# Patient Record
Sex: Male | Born: 1960 | Race: Black or African American | Hispanic: No | Marital: Married | Smoking: Never smoker
Health system: Southern US, Community
[De-identification: ages and names within clinical notes are randomized; demographics above are authoritative.]

## PROBLEM LIST (undated history)

## (undated) DIAGNOSIS — E119 Type 2 diabetes mellitus without complications: Secondary | ICD-10-CM

## (undated) DIAGNOSIS — M549 Dorsalgia, unspecified: Secondary | ICD-10-CM

## (undated) DIAGNOSIS — E66811 Obesity, class 1: Secondary | ICD-10-CM

## (undated) DIAGNOSIS — E669 Obesity, unspecified: Secondary | ICD-10-CM

## (undated) DIAGNOSIS — G8929 Other chronic pain: Secondary | ICD-10-CM

## (undated) HISTORY — PX: COLONOSCOPY: SHX174

## (undated) HISTORY — PX: BACK SURGERY: SHX140

---

## 1999-09-01 ENCOUNTER — Emergency Department (HOSPITAL_COMMUNITY): Admission: EM | Admit: 1999-09-01 | Discharge: 1999-09-01 | Payer: Self-pay | Admitting: Emergency Medicine

## 2000-04-23 ENCOUNTER — Ambulatory Visit (HOSPITAL_COMMUNITY): Admission: RE | Admit: 2000-04-23 | Discharge: 2000-04-23 | Payer: Self-pay | Admitting: Family Medicine

## 2000-04-23 ENCOUNTER — Encounter: Payer: Self-pay | Admitting: Family Medicine

## 2000-09-28 ENCOUNTER — Emergency Department (HOSPITAL_COMMUNITY): Admission: EM | Admit: 2000-09-28 | Discharge: 2000-09-28 | Payer: Self-pay | Admitting: Emergency Medicine

## 2002-04-06 ENCOUNTER — Emergency Department (HOSPITAL_COMMUNITY): Admission: EM | Admit: 2002-04-06 | Discharge: 2002-04-07 | Payer: Self-pay | Admitting: Emergency Medicine

## 2002-10-27 ENCOUNTER — Emergency Department (HOSPITAL_COMMUNITY): Admission: EM | Admit: 2002-10-27 | Discharge: 2002-10-28 | Payer: Self-pay | Admitting: Emergency Medicine

## 2006-03-25 ENCOUNTER — Emergency Department (HOSPITAL_COMMUNITY): Admission: EM | Admit: 2006-03-25 | Discharge: 2006-03-25 | Payer: Self-pay | Admitting: Emergency Medicine

## 2007-04-30 ENCOUNTER — Emergency Department (HOSPITAL_COMMUNITY): Admission: EM | Admit: 2007-04-30 | Discharge: 2007-04-30 | Payer: Self-pay | Admitting: Emergency Medicine

## 2008-02-16 ENCOUNTER — Ambulatory Visit: Payer: Self-pay | Admitting: Pulmonary Disease

## 2008-02-16 ENCOUNTER — Inpatient Hospital Stay (HOSPITAL_COMMUNITY): Admission: EM | Admit: 2008-02-16 | Discharge: 2008-02-19 | Payer: Self-pay | Admitting: Emergency Medicine

## 2009-06-18 ENCOUNTER — Emergency Department (HOSPITAL_COMMUNITY): Admission: EM | Admit: 2009-06-18 | Discharge: 2009-06-18 | Payer: Self-pay | Admitting: Emergency Medicine

## 2010-04-19 LAB — BASIC METABOLIC PANEL
BUN: 10 mg/dL (ref 6–23)
BUN: 15 mg/dL (ref 6–23)
BUN: 7 mg/dL (ref 6–23)
BUN: 8 mg/dL (ref 6–23)
CO2: 24 mEq/L (ref 19–32)
CO2: 28 mEq/L (ref 19–32)
Calcium: 9.3 mg/dL (ref 8.4–10.5)
Calcium: 8.9 mg/dL (ref 8.4–10.5)
Chloride: 106 mEq/L (ref 96–112)
Chloride: 106 mEq/L (ref 96–112)
Creatinine, Ser: 1.54 mg/dL — ABNORMAL HIGH (ref 0.4–1.5)
Creatinine, Ser: 1.73 mg/dL — ABNORMAL HIGH (ref 0.4–1.5)
Creatinine, Ser: 1.3 mg/dL (ref 0.4–1.5)
GFR calc Af Amer: 59 mL/min — ABNORMAL LOW (ref >60)
GFR calc non Af Amer: 43 mL/min — ABNORMAL LOW (ref >60)
GFR calc non Af Amer: 55 mL/min — ABNORMAL LOW (ref >60)
Glucose, Bld: 175 mg/dL — ABNORMAL HIGH (ref 70–99)
Glucose, Bld: 176 mg/dL — ABNORMAL HIGH (ref 70–99)
Potassium: 4 mEq/L (ref 3.5–5.1)
Potassium: 4.1 mEq/L (ref 3.5–5.1)
Sodium: 139 mEq/L (ref 135–145)

## 2010-04-19 LAB — BLOOD GAS, ARTERIAL
Acid-Base Excess: 0.6 mmol/L (ref 0.0–2.0)
Acid-base deficit: 1.7 mmol/L (ref 0.0–2.0)
Bicarbonate: 23.5 mEq/L (ref 20.0–24.0)
Bicarbonate: 24.6 mEq/L — ABNORMAL HIGH (ref 20.0–24.0)
FIO2: 1 %
FIO2: 40 %
O2 Saturation: 98.9 %
O2 Saturation: 98.9 %
PEEP: 5 cmH2O
Patient temperature: 101
Patient temperature: 98.6
TCO2: 24.9 mmol/L (ref 0–100)
TCO2: 25.8 mmol/L (ref 0–100)
pH, Arterial: 7.325 — ABNORMAL LOW (ref 7.350–7.450)

## 2010-04-19 LAB — POCT CARDIAC MARKERS
CKMB, poc: 3 ng/mL (ref 1.0–8.0)
Myoglobin, poc: >500 ng/mL (ref 12–200)

## 2010-04-19 LAB — CK TOTAL AND CKMB (NOT AT ARMC)
Total CK: 954 U/L — ABNORMAL HIGH (ref 7–232)
Total CK: 1426 U/L — ABNORMAL HIGH (ref 7–232)

## 2010-04-19 LAB — GLUCOSE, CAPILLARY
Glucose-Capillary: 133 mg/dL — ABNORMAL HIGH (ref 70–99)
Glucose-Capillary: 205 mg/dL — ABNORMAL HIGH (ref 70–99)
Glucose-Capillary: 206 mg/dL — ABNORMAL HIGH (ref 70–99)
Glucose-Capillary: 210 mg/dL — ABNORMAL HIGH (ref 70–99)
Glucose-Capillary: 257 mg/dL — ABNORMAL HIGH (ref 70–99)
Glucose-Capillary: 257 mg/dL — ABNORMAL HIGH (ref 70–99)

## 2010-04-19 LAB — HEPATIC FUNCTION PANEL
Alkaline Phosphatase: 102 U/L (ref 39–117)
Indirect Bilirubin: 0.4 mg/dL (ref 0.3–0.9)
Total Bilirubin: 0.6 mg/dL (ref 0.3–1.2)

## 2010-04-19 LAB — URINE DRUGS OF ABUSE SCREEN W ALC, ROUTINE (REF LAB)
Barbiturate Quant, Ur: NEGATIVE
Marijuana Metabolite: NEGATIVE
Phencyclidine (PCP): NEGATIVE
Propoxyphene: NEGATIVE

## 2010-04-19 LAB — POCT I-STAT, CHEM 8
Chloride: 107 mEq/L (ref 96–112)
Creatinine, Ser: 1.8 mg/dL — ABNORMAL HIGH (ref 0.4–1.5)
Glucose, Bld: 117 mg/dL — ABNORMAL HIGH (ref 70–99)
Glucose, Bld: 94 mg/dL (ref 70–99)
HCT: 41 % (ref 39.0–52.0)
HCT: 45 % (ref 39.0–52.0)
Hemoglobin: 13.9 g/dL (ref 13.0–17.0)
Potassium: 2.5 mEq/L — CL (ref 3.5–5.1)
Potassium: 3.3 mEq/L — ABNORMAL LOW (ref 3.5–5.1)
Sodium: 143 mEq/L (ref 135–145)
Sodium: 144 mEq/L (ref 135–145)
TCO2: 21 mmol/L (ref 0–100)

## 2010-04-19 LAB — BENZODIAZEPINE, QUANTITATIVE, URINE
Flurazepam GC/MS Conf: NEGATIVE
Nordiazepam GC/MS Conf: NEGATIVE

## 2010-04-19 LAB — ABO/RH: ABO/RH(D): O POS

## 2010-04-19 LAB — COMPREHENSIVE METABOLIC PANEL
ALT: 31 U/L (ref 0–53)
AST: 72 U/L — ABNORMAL HIGH (ref 0–37)
Alkaline Phosphatase: 92 U/L (ref 39–117)
CO2: 28 mEq/L (ref 19–32)
Calcium: 8.4 mg/dL (ref 8.4–10.5)
Chloride: 109 mEq/L (ref 96–112)
GFR calc Af Amer: 56 mL/min — ABNORMAL LOW (ref >60)
GFR calc non Af Amer: 46 mL/min — ABNORMAL LOW (ref >60)
Glucose, Bld: 74 mg/dL (ref 70–99)
Potassium: 4.1 mEq/L (ref 3.5–5.1)
Sodium: 142 mEq/L (ref 135–145)

## 2010-04-19 LAB — CARDIAC PANEL(CRET KIN+CKTOT+MB+TROPI)
CK, MB: 61.1 ng/mL — ABNORMAL HIGH (ref 0.3–4.0)
CK, MB: 11.8 ng/mL — ABNORMAL HIGH (ref 0.3–4.0)
Relative Index: 1.5 (ref 0.0–2.5)
Relative Index: 2.7 — ABNORMAL HIGH (ref 0.0–2.5)
Relative Index: 0.4 (ref 0.0–2.5)
Total CK: 3528 U/L — ABNORMAL HIGH (ref 7–232)
Total CK: 4139 U/L — ABNORMAL HIGH (ref 7–232)

## 2010-04-19 LAB — LIPID PANEL
Cholesterol: 176 mg/dL (ref 0–200)
LDL Cholesterol: 104 mg/dL — ABNORMAL HIGH (ref 0–99)
Triglycerides: 204 mg/dL — ABNORMAL HIGH (ref <150)

## 2010-04-19 LAB — CBC
HCT: 33.2 % — ABNORMAL LOW (ref 39.0–52.0)
HCT: 34.6 % — ABNORMAL LOW (ref 39.0–52.0)
Hemoglobin: 11.7 g/dL — ABNORMAL LOW (ref 13.0–17.0)
MCV: 87.9 fL (ref 78.0–100.0)
MCV: 88.3 fL (ref 78.0–100.0)
Platelets: 332 10*3/uL (ref 150–400)
RBC: 3.92 MIL/uL — ABNORMAL LOW (ref 4.22–5.81)
RBC: 3.79 MIL/uL — ABNORMAL LOW (ref 4.22–5.81)
RDW: 12.5 % (ref 11.5–15.5)
RDW: 13.1 % (ref 11.5–15.5)
WBC: 18.6 10*3/uL — ABNORMAL HIGH (ref 4.0–10.5)

## 2010-04-19 LAB — MAGNESIUM
Magnesium: 1.9 mg/dL (ref 1.5–2.5)
Magnesium: 2.1 mg/dL (ref 1.5–2.5)
Magnesium: 2.2 mg/dL (ref 1.5–2.5)

## 2010-04-19 LAB — POCT I-STAT 3, ART BLOOD GAS (G3+)
Acid-base deficit: 5 mmol/L — ABNORMAL HIGH (ref 0.0–2.0)
O2 Saturation: 100 %
pO2, Arterial: 192 mmHg — ABNORMAL HIGH (ref 80.0–100.0)

## 2010-04-19 LAB — CROSSMATCH: Antibody Screen: NEGATIVE

## 2010-04-19 LAB — CULTURE, BLOOD (ROUTINE X 2)
Culture: NO GROWTH
Culture: NO GROWTH

## 2010-04-19 LAB — PHOSPHORUS: Phosphorus: 1.1 mg/dL — ABNORMAL LOW (ref 2.3–4.6)
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
## 2010-05-17 NOTE — Cardiovascular Report (Signed)
NAME:  Lucas White, Lucas White                ACCOUNT NO.:  [id number]   MEDICAL RECORD NO.:  [id number]          PATIENT TYPE:  INP   LOCATION:  1829                         FACILITY:  [facility]   PHYSICIAN:  Mohan N. Sharyn Lull, M.D. DATE OF BIRTH:  25-Jun-1960   DATE OF PROCEDURE:  02/16/2008  DATE OF DISCHARGE:                            CARDIAC CATHETERIZATION   PROCEDURE:  Left cardiac catheterization with selective left and right  coronary angiography, left ventriculography, via right groin using  Judkins technique.   INDICATIONS FOR THE PROCEDURE:  Mr. Callaway is a 50 year old black male  with past medical history significant for insulin-requiring diabetes  mellitus, erectile dysfunction, history of cocaine abuse in the past.  He was brought to the ED by EMS as code STEMI as the patient was found  unresponsive after eating shrimp per wife.  Wife did CPR on the field  for few minutes and the patient had EKG done on field, which showed  sinus tachycardia with heart rate of 140 with ST elevation in  anterolateral leads and ST depression in inferior leads.  Repeat EKG  done in the ED showed sinus tach with normalization of ST elevation in  the anterolateral leads.  The patient complained of severe back pain in  the ED and became hypotensive and was intubated in the ED.  The patient  received fluid bolus and was started on dopamine, had emergency CT of  the chest, abdomen, and pelvis, which showed no evidence of dissection  due to ST elevation and cardiac arrest on the field.  I discussed with  the patient's wife and family regarding emergency left cath, possible  PTCA stenting, its risks and benefits i.e. death, MI, stroke, need for  emergency CABG, risk of restenosis, etc., and consented for the  procedure.  Off note, wife states he has lately been taking Cialis and  extends over the counter medication.  Wife denied he has been doing any  cocaine recently.  The patient presently is intubated,  unresponsive, no  further history could be obtained from the patient.   DESCRIPTION OF PROCEDURE:  After obtaining the informed consent, the  patient was brought to the cath lab and was placed on fluoroscopy table.  Right groin was prepped and draped in usual fashion.  Xylocaine 2% was  used for local anesthesia in the right groin.  With the help of thin-  wall needle, 6-French arterial sheath was placed.  The sheath was  aspirated and flushed.  Next, 6-French left Judkins catheter was  advanced over the wire under fluoroscopic guidance up to the ascending  aorta.  Wire was pulled out.  The catheter was aspirated and connected  to the manifold.  Catheter was further advanced and engaged into left  coronary ostium.  Multiple views of the left system were taken.  Next,  the catheter was disengaged and was pulled out over the wire and was  replaced with 6-French right Judkins catheter, which was advanced over  the wire under fluoroscopic guidance up to the ascending aorta.  Wire  was pulled out.  The catheter was aspirated and  connected to the  manifold.  Catheter was further advanced and engaged into right coronary  ostium.  Multiple views of the right system were taken.  Next, the  catheter was disengaged and was pulled out over the wire and was  replaced with 6-French pigtail catheter, which was advanced over the  wire under fluoroscopic guidance up to the ascending aorta.  Wire was  pulled out.  The catheter was aspirated and connected to the manifold.  Catheter was further advanced across the aortic valve into the LV.  LV  pressures were recorded.  Next, LV graft was done in 30-degree RAO  position.  Post angiographic pressures were recorded from LV and then  pullback pressures were recorded from the aorta.  There was no gradient  across the aortic valve.  Next, the pigtail catheter was pulled out over  the wire.  Sheaths were aspirated and flushed.   FINDINGS:  LV showed mild  anterolateral wall hypokinesia.  EF of 50-55%.  Left main was patent.  LAD had mid 40-50% spasm, which was relieved with  intracoronary nitro.  Diagonal 1 and 2 were very very small, which were  patent.  Diagonal three was small, which was patent.  Left circumflex  was large, which was patent.  High OM-1 was  small which was patent.  OM-2 and 3 were moderate sized, which were  patent.  RCA was large, which was patent.  PDA was small, which was  patent.  PLV branches were very small, which were patent.  The patient  tolerated the procedure well.  There were no complications.  The patient  was transferred to CCU in stable condition.      Eduardo Osier. Sharyn Lull, M.D.  Electronically Signed     MNH/MEDQ  D:  02/16/2008  T:  02/17/2008  Job:  [id number]   cc:   Cath Lab

## 2010-05-20 NOTE — Discharge Summary (Signed)
NAMEDALANTE, MINUS NO.:  [id number]   MEDICAL RECORD NO.:  [id number]          PATIENT TYPE:  INP   LOCATION:  2041                         FACILITY:  [facility]   PHYSICIAN:  Eduardo Osier. Sharyn Lull, M.D. DATE OF BIRTH:  [birth year]   DATE OF ADMISSION:  02/16/2008  DATE OF DISCHARGE:  02/19/2008                               DISCHARGE SUMMARY   ADDENDUM   Briefly, Mr. Salceda is a 50 year old black male with past medical history  significant for insulin-requiring diabetes mellitus, erectile  dysfunction, and history of cocaine abuse in the past.  He was brought  to the ED by EMS and was found unresponsive by his wife.  The patient  had CPR at home by his wife and was noted to be hypotensive with blood  pressure of 60 and complained of severe back pain.  The patient  subsequently had CT of the chest and abdomen, which was negative for  dissection.  The patient did not receive beta-blockers initially because  of severe hypotension and cardiogenic shock.  The patient was noted to  have positive cocaine by urine drug screen.  The patient was also  discharged not on beta-blockers as the patient had severe vasospasm  because of cocaine and was placed on nitrates and calcium channel  blockers upon discharge.  Beta blockers were contraindicated at the time  of admission and at the time of discharge.      Eduardo Osier. Sharyn Lull, M.D.  Electronically Signed     MNH/MEDQ  D:  03/03/2008  T:  03/04/2008  Job:  [id number]

## 2010-07-04 ENCOUNTER — Other Ambulatory Visit (HOSPITAL_COMMUNITY): Payer: Self-pay | Admitting: Cardiology

## 2010-07-12 ENCOUNTER — Ambulatory Visit (HOSPITAL_COMMUNITY)
Admission: RE | Admit: 2010-07-12 | Discharge: 2010-07-12 | Payer: Self-pay | Source: Ambulatory Visit | Attending: Cardiology | Admitting: Cardiology

## 2010-07-12 ENCOUNTER — Ambulatory Visit (HOSPITAL_COMMUNITY)
Admission: RE | Admit: 2010-07-12 | Discharge: 2010-07-12 | Disposition: A | Discharge status: Home / Self Care | Payer: BC Managed Care – PPO | Source: Ambulatory Visit | Attending: Cardiology | Admitting: Cardiology

## 2010-07-12 ENCOUNTER — Encounter (HOSPITAL_COMMUNITY)
Admission: RE | Admit: 2010-07-12 | Discharge: 2010-07-12 | Disposition: A | Discharge status: Home / Self Care | Payer: BC Managed Care – PPO | Source: Ambulatory Visit | Attending: Cardiology | Admitting: Cardiology

## 2010-07-12 DIAGNOSIS — R079 Chest pain, unspecified: Secondary | ICD-10-CM | POA: Insufficient documentation

## 2010-07-12 MED ORDER — TECHNETIUM TC 99M TETROFOSMIN IV KIT
30.0000 | PACK | Freq: Once | INTRAVENOUS | Status: AC | PRN
  Administered 2010-07-12: 30 via INTRAVENOUS

## 2010-07-12 MED ORDER — TECHNETIUM TC 99M TETROFOSMIN IV KIT
10.0000 | PACK | Freq: Once | INTRAVENOUS | Status: AC | PRN
  Administered 2010-07-12: 10 via INTRAVENOUS

## 2010-08-03 ENCOUNTER — Encounter (HOSPITAL_COMMUNITY)
Admission: RE | Admit: 2010-08-03 | Discharge: 2010-08-03 | Disposition: A | Discharge status: Home / Self Care | Payer: BC Managed Care – PPO | Source: Ambulatory Visit | Attending: Neurosurgery | Admitting: Neurosurgery

## 2010-08-03 ENCOUNTER — Other Ambulatory Visit (HOSPITAL_COMMUNITY): Payer: Self-pay | Admitting: Neurosurgery

## 2010-08-03 ENCOUNTER — Ambulatory Visit (HOSPITAL_COMMUNITY)
Admission: RE | Admit: 2010-08-03 | Discharge: 2010-08-03 | Disposition: A | Discharge status: Home / Self Care | Payer: BC Managed Care – PPO | Source: Ambulatory Visit | Attending: Neurosurgery | Admitting: Neurosurgery

## 2010-08-03 DIAGNOSIS — M5126 Other intervertebral disc displacement, lumbar region: Secondary | ICD-10-CM | POA: Insufficient documentation

## 2010-08-03 DIAGNOSIS — I10 Essential (primary) hypertension: Secondary | ICD-10-CM | POA: Insufficient documentation

## 2010-08-03 DIAGNOSIS — Z01812 Encounter for preprocedural laboratory examination: Secondary | ICD-10-CM | POA: Insufficient documentation

## 2010-08-03 DIAGNOSIS — Z01818 Encounter for other preprocedural examination: Secondary | ICD-10-CM | POA: Insufficient documentation

## 2010-08-03 LAB — SURGICAL PCR SCREEN
MRSA, PCR: NEGATIVE
Staphylococcus aureus: NEGATIVE

## 2010-08-03 LAB — BASIC METABOLIC PANEL
BUN: 14 mg/dL (ref 6–23)
GFR calc Af Amer: >60 mL/min (ref >60)
GFR calc non Af Amer: >60 mL/min (ref >60)
Potassium: 4.3 mEq/L (ref 3.5–5.1)
Sodium: 139 mEq/L (ref 135–145)

## 2010-08-03 LAB — CBC
MCHC: 34.1 g/dL (ref 30.0–36.0)
Platelets: 385 10*3/uL (ref 150–400)
RDW: 12.2 % (ref 11.5–15.5)
WBC: 6.4 10*3/uL (ref 4.0–10.5)

## 2010-08-04 LAB — HEPATIC FUNCTION PANEL
Alkaline Phosphatase: 83 U/L (ref 39–117)
Indirect Bilirubin: 0.1 mg/dL — ABNORMAL LOW (ref 0.3–0.9)
Total Protein: 7.4 g/dL (ref 6.0–8.3)

## 2010-08-11 ENCOUNTER — Ambulatory Visit (HOSPITAL_COMMUNITY): Payer: BC Managed Care – PPO

## 2010-08-11 ENCOUNTER — Ambulatory Visit (HOSPITAL_COMMUNITY)
Admission: RE | Admit: 2010-08-11 | Discharge: 2010-08-12 | Disposition: A | Discharge status: Home / Self Care | Payer: BC Managed Care – PPO | Source: Ambulatory Visit | Attending: Neurosurgery | Admitting: Neurosurgery

## 2010-08-11 DIAGNOSIS — Z01810 Encounter for preprocedural cardiovascular examination: Secondary | ICD-10-CM | POA: Insufficient documentation

## 2010-08-11 DIAGNOSIS — M51379 Other intervertebral disc degeneration, lumbosacral region without mention of lumbar back pain or lower extremity pain: Secondary | ICD-10-CM | POA: Insufficient documentation

## 2010-08-11 DIAGNOSIS — Z01812 Encounter for preprocedural laboratory examination: Secondary | ICD-10-CM | POA: Insufficient documentation

## 2010-08-11 DIAGNOSIS — M5137 Other intervertebral disc degeneration, lumbosacral region: Secondary | ICD-10-CM | POA: Insufficient documentation

## 2010-08-11 DIAGNOSIS — E119 Type 2 diabetes mellitus without complications: Secondary | ICD-10-CM | POA: Insufficient documentation

## 2010-08-11 DIAGNOSIS — M5126 Other intervertebral disc displacement, lumbar region: Secondary | ICD-10-CM | POA: Insufficient documentation

## 2010-08-11 DIAGNOSIS — Z01818 Encounter for other preprocedural examination: Secondary | ICD-10-CM | POA: Insufficient documentation

## 2010-08-11 DIAGNOSIS — I10 Essential (primary) hypertension: Secondary | ICD-10-CM | POA: Insufficient documentation

## 2010-08-11 DIAGNOSIS — M47817 Spondylosis without myelopathy or radiculopathy, lumbosacral region: Secondary | ICD-10-CM | POA: Insufficient documentation

## 2010-08-11 LAB — GLUCOSE, CAPILLARY
Glucose-Capillary: 139 mg/dL — ABNORMAL HIGH (ref 70–99)
Glucose-Capillary: 111 mg/dL — ABNORMAL HIGH (ref 70–99)
Glucose-Capillary: 121 mg/dL — ABNORMAL HIGH (ref 70–99)

## 2010-08-12 LAB — GLUCOSE, CAPILLARY: Glucose-Capillary: 195 mg/dL — ABNORMAL HIGH (ref 70–99)

## 2010-08-12 NOTE — Op Note (Signed)
NAMESIERRA, BISSONETTE NO.:  [id number]  MEDICAL RECORD NO.:  [id number]  LOCATION:  3534                         FACILITY:  [facility]  PHYSICIAN:  Danae Orleans. Venetia Maxon, M.D.  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  PROCEDURE:  Right L4-5 far lateral METRx microdiskectomy with microdissection.  SURGEON:  Danae Orleans. Venetia Maxon, MD  ASSISTANT:  Stefani Dama, MD  ANESTHESIA:  General endotracheal anesthesia.  ESTIMATED BLOOD LOSS:  Minimal.  COMPLICATIONS:  None.  DISPOSITION:  To recovery.  INDICATIONS:  Lucas White is a 50 year old man with a large far lateral disk herniation at L4-5 on the right causing significant right L4 radiculopathy.  It was elected to take him to surgery for far lateral microdiskectomy.  PROCEDURE:  Lucas White was brought to the operating room.  Following satisfactory and uncomplicated induction of general endotracheal anesthesia and placement of intravenous lines, the patient was placed in prone position on Wilson frame.  His low back was prepped and draped in the usual sterile fashion after marking the L4 and L5 pedicles on the right and the pars interarticularis on the right of L4.  An incision was made after infiltrating skin with local lidocaine following prep with DuraPrep.  The incision was carried through the fascia and then using sequential dilators, a7-cm x 22-mm METRx tubular retractor was placed and anchored in a standard fashion.  The lateral aspect of the pars was exposed and under microdissection, the high-speed drill was utilized to drill the lateral aspect of pars and  superior articular process of the L4-5 facet complex.  The bone removal was completed with Kerrison rongeurs.  The intertransverse ligament was then incised and removed in a piecemeal fashion.  The L4 nerve root was identified.  It was extremely compressed and distorted cephalad.  There was a significant amount of scarring and using gentle microdissection technique, the L4 nerve root was mobilized superolaterally exposing a large herniation of disk material and multiple fragments of disk material were removed. Spondylitic ridges were cleared of investing residual disk material. The interspace was entered and additional disk material was removed with significant decompression of the L4 nerve root.  Using ball hooks, I was able to mobilize additional laterally herniated disk material with significant decompression of nerve root.  Hemostasis was assured.  The wound was irrigated and bathed in Depo-Medrol and fentanyl.  Self- retaining retractor was removed.  The lumbodorsal fascia was closed with 2-0 Vicryl sutures.  Skin edges were reapproximated with 2-0 Vicryl interrupted inverted sutures and skin edges were approximated with 3-0 Vicryl subcuticular stitch.  The wound was dressed with Dermabond.  The patient was extubated in the operating room and taken to the recovery in stable satisfactory having tolerated his operation well.  Counts were correct at the end of the case.     Danae Orleans. Venetia Maxon, M.D.  JDS/MEDQ  D:  08/11/2010  T:  08/12/2010  Job:  [id number]  Electronically Signed by Maeola Harman M.D. on 08/12/2010 03:28:12 PM

## 2010-08-26 ENCOUNTER — Emergency Department (HOSPITAL_COMMUNITY)
Admission: EM | Admit: 2010-08-26 | Discharge: 2010-08-26 | Disposition: A | Discharge status: Home / Self Care | Payer: BC Managed Care – PPO | Attending: Emergency Medicine | Admitting: Emergency Medicine

## 2010-08-26 DIAGNOSIS — E119 Type 2 diabetes mellitus without complications: Secondary | ICD-10-CM | POA: Insufficient documentation

## 2010-08-26 DIAGNOSIS — M545 Low back pain, unspecified: Secondary | ICD-10-CM | POA: Insufficient documentation

## 2010-08-26 DIAGNOSIS — M79609 Pain in unspecified limb: Secondary | ICD-10-CM | POA: Insufficient documentation

## 2010-08-26 DIAGNOSIS — R209 Unspecified disturbances of skin sensation: Secondary | ICD-10-CM | POA: Insufficient documentation

## 2010-08-26 LAB — GLUCOSE, CAPILLARY: Glucose-Capillary: 350 mg/dL — ABNORMAL HIGH (ref 70–99)

## 2010-09-06 ENCOUNTER — Other Ambulatory Visit: Payer: Self-pay | Admitting: Neurosurgery

## 2010-09-06 DIAGNOSIS — M5126 Other intervertebral disc displacement, lumbar region: Secondary | ICD-10-CM

## 2010-09-11 ENCOUNTER — Ambulatory Visit
Admission: RE | Admit: 2010-09-11 | Discharge: 2010-09-11 | Disposition: A | Discharge status: Home / Self Care | Payer: BC Managed Care – PPO | Source: Ambulatory Visit | Attending: Neurosurgery | Admitting: Neurosurgery

## 2010-09-11 DIAGNOSIS — M5126 Other intervertebral disc displacement, lumbar region: Secondary | ICD-10-CM

## 2010-09-11 MED ORDER — GADOBENATE DIMEGLUMINE 529 MG/ML IV SOLN
20.0000 mL | Freq: Once | INTRAVENOUS | Status: AC | PRN
  Administered 2010-09-11: 20 mL via INTRAVENOUS

## 2010-09-21 ENCOUNTER — Encounter (HOSPITAL_COMMUNITY)
Admission: RE | Admit: 2010-09-21 | Discharge: 2010-09-21 | Disposition: A | Discharge status: Home / Self Care | Payer: BC Managed Care – PPO | Source: Ambulatory Visit | Attending: Neurosurgery | Admitting: Neurosurgery

## 2010-09-21 LAB — CBC
Hemoglobin: 12.3 g/dL — ABNORMAL LOW (ref 13.0–17.0)
MCH: 29.7 pg (ref 26.0–34.0)
MCV: 85 fL (ref 78.0–100.0)
RBC: 4.14 MIL/uL — ABNORMAL LOW (ref 4.22–5.81)
WBC: 5.7 10*3/uL (ref 4.0–10.5)

## 2010-09-21 LAB — COMPREHENSIVE METABOLIC PANEL
ALT: 31 U/L (ref 0–53)
AST: 17 U/L (ref 0–37)
CO2: 30 mEq/L (ref 19–32)
Calcium: 10.3 mg/dL (ref 8.4–10.5)
Chloride: 101 mEq/L (ref 96–112)
Creatinine, Ser: 1.07 mg/dL (ref 0.50–1.35)
GFR calc Af Amer: >60 mL/min (ref >60)
GFR calc non Af Amer: >60 mL/min (ref >60)
Glucose, Bld: 253 mg/dL — ABNORMAL HIGH (ref 70–99)
Total Bilirubin: 0.2 mg/dL — ABNORMAL LOW (ref 0.3–1.2)

## 2010-09-21 LAB — SURGICAL PCR SCREEN: Staphylococcus aureus: NEGATIVE

## 2010-09-22 ENCOUNTER — Observation Stay (HOSPITAL_COMMUNITY): Payer: BC Managed Care – PPO

## 2010-09-22 ENCOUNTER — Ambulatory Visit (HOSPITAL_COMMUNITY)
Admission: RE | Admit: 2010-09-22 | Discharge: 2010-09-23 | Disposition: A | Discharge status: Home / Self Care | Payer: BC Managed Care – PPO | Source: Ambulatory Visit | Attending: Neurosurgery | Admitting: Neurosurgery

## 2010-09-22 DIAGNOSIS — Z01812 Encounter for preprocedural laboratory examination: Secondary | ICD-10-CM | POA: Insufficient documentation

## 2010-09-22 DIAGNOSIS — M5126 Other intervertebral disc displacement, lumbar region: Secondary | ICD-10-CM | POA: Insufficient documentation

## 2010-09-22 DIAGNOSIS — I252 Old myocardial infarction: Secondary | ICD-10-CM | POA: Insufficient documentation

## 2010-09-22 DIAGNOSIS — M47817 Spondylosis without myelopathy or radiculopathy, lumbosacral region: Secondary | ICD-10-CM | POA: Insufficient documentation

## 2010-09-22 DIAGNOSIS — I10 Essential (primary) hypertension: Secondary | ICD-10-CM | POA: Insufficient documentation

## 2010-09-22 DIAGNOSIS — M5137 Other intervertebral disc degeneration, lumbosacral region: Secondary | ICD-10-CM | POA: Insufficient documentation

## 2010-09-22 DIAGNOSIS — M51379 Other intervertebral disc degeneration, lumbosacral region without mention of lumbar back pain or lower extremity pain: Secondary | ICD-10-CM | POA: Insufficient documentation

## 2010-09-22 DIAGNOSIS — K219 Gastro-esophageal reflux disease without esophagitis: Secondary | ICD-10-CM | POA: Insufficient documentation

## 2010-09-22 DIAGNOSIS — E119 Type 2 diabetes mellitus without complications: Secondary | ICD-10-CM | POA: Insufficient documentation

## 2010-09-22 LAB — GLUCOSE, CAPILLARY
Glucose-Capillary: 151 mg/dL — ABNORMAL HIGH (ref 70–99)
Glucose-Capillary: 176 mg/dL — ABNORMAL HIGH (ref 70–99)
Glucose-Capillary: 195 mg/dL — ABNORMAL HIGH (ref 70–99)

## 2010-09-27 NOTE — Op Note (Signed)
NAMEANDRUE, DINI NO.:  [id number]  MEDICAL RECORD NO.:  [id number]  LOCATION:  3537                         FACILITY:  [facility]  PHYSICIAN:  Danae Orleans. Venetia Maxon, M.D.  DATE OF BIRTH:  Feb 22, 1960  DATE OF PROCEDURE:  09/22/2010 DATE OF DISCHARGE:                              OPERATIVE REPORT   PREOPERATIVE DIAGNOSES:  Right L4-5 far lateral herniated lumbar disk, recurrent herniated lumbar disk with spondylosis, degenerative disease and radiculopathy.  POSTOPERATIVE DIAGNOSES:  Right L4-5 far lateral herniated lumbar disk, recurrent herniated lumbar disk with spondylosis, degenerative disease and radiculopathy.  PROCEDURE:  Redo right L4-5 far lateral microdiskectomy with microdissection.  SURGEON:  Danae Orleans. Venetia Maxon, MD  ASSISTANT:  Hewitt Shorts, MD  ANESTHESIA:  General endotracheal anesthesia.  ESTIMATED BLOOD LOSS:  Minimal.  COMPLICATIONS:  None.  DISPOSITION:  Recovery.  INDICATIONS:  Lucas White is a 50 year old man with a far lateral disk herniation at L4-5 on the right.  It was elected to take him to surgery initially for a far lateral right microdiskectomy at the L4-5 level. The patient had a great relief of pain and then had recurrence of pain. He was found on repeat MRI to have a large recurrent fragment of herniated disk material and was elected to take him to surgery for redo microdiskectomy.  PROCEDURE IN DETAILS:  Mr. Obst was brought to the operating room. Following satisfactory and uncomplicated induction of general endotracheal anesthesia plus intravenous lines, the patient was placed in prone position on Wilson frame.  His low back was prepped and draped in usual sterile fashion.  Using an [employer] expandable retractor system, the previous incision was reopened after infiltrating with local lidocaine and the previous bony exposure was identified and expandable retractor was placed.  Intraoperative x-ray confirmed  correct orientation.  The rim of bone was then removed with a high-speed drill and Kerrison rongeur.  The L4 nerve root was identified and extraforaminal space was carefully dissected away from scar tissue. Decompression of the nerve was then performed above and below the nerve root and a large amount of laterally-based disk material was removed with significant decompression at the nerve root.  Medial aspect of the foramen was then and extraforaminal space was also decompressed as was the lateral aspect using a variety of pituitary rongeurs, Epstein curettes and microdissector.  It was felt at this point after painstaking microdissection and did decompression under microscope that the thorough diskectomy to perform.  There was no evidence of any residual nerve root compression.  Hemostasis was assured.  The wound was irrigated, bathed in Depo-Medrol and fentanyl.  Self-retaining retractor was removed.  The lumbodorsal fascia was closed with 0 Vicryl sutures. Subcutaneous tissues were approximated with 2-0 Vicryl interrupted inverted sutures.  Skin edges were approximated with 3-0 Vicryl subcuticular stitch.  The wound was dressed with Dermabond.  The patient was extubated in the operating room, taken to the recovery room in stable satisfactory condition having tolerated the operation well. Counts were correct at the end of the case.     Danae Orleans. Venetia Maxon, M.D.     JDS/MEDQ  D:  09/22/2010  T:  09/23/2010  Job:  [id number]  Electronically Signed by Maeola Harman M.D. on 09/27/2010 10:37:34 AM

## 2010-10-16 ENCOUNTER — Emergency Department (HOSPITAL_COMMUNITY)
Admission: EM | Admit: 2010-10-16 | Discharge: 2010-10-16 | Disposition: A | Discharge status: Home / Self Care | Payer: BC Managed Care – PPO | Attending: Emergency Medicine | Admitting: Emergency Medicine

## 2010-10-16 DIAGNOSIS — M545 Low back pain, unspecified: Secondary | ICD-10-CM | POA: Insufficient documentation

## 2010-10-16 DIAGNOSIS — E119 Type 2 diabetes mellitus without complications: Secondary | ICD-10-CM | POA: Insufficient documentation

## 2011-01-09 ENCOUNTER — Other Ambulatory Visit: Payer: Self-pay | Admitting: Neurosurgery

## 2011-01-09 DIAGNOSIS — M5126 Other intervertebral disc displacement, lumbar region: Secondary | ICD-10-CM

## 2011-01-10 ENCOUNTER — Ambulatory Visit
Admission: RE | Admit: 2011-01-10 | Discharge: 2011-01-10 | Disposition: A | Discharge status: Home / Self Care | Payer: BC Managed Care – PPO | Source: Ambulatory Visit | Attending: Neurosurgery | Admitting: Neurosurgery

## 2011-01-10 DIAGNOSIS — M5126 Other intervertebral disc displacement, lumbar region: Secondary | ICD-10-CM

## 2011-01-10 MED ORDER — GADOBENATE DIMEGLUMINE 529 MG/ML IV SOLN
20.0000 mL | Freq: Once | INTRAVENOUS | Status: AC | PRN
  Administered 2011-01-10: 20 mL via INTRAVENOUS

## 2012-08-26 IMAGING — CR DG CHEST 2V
2 series · 2 of 2 positions shown · non-contrast
Comparison: 02/17/2008

CLINICAL DATA: Preop hypertension.

CHEST - 2 VIEW

[view not recorded (1 of 2)]
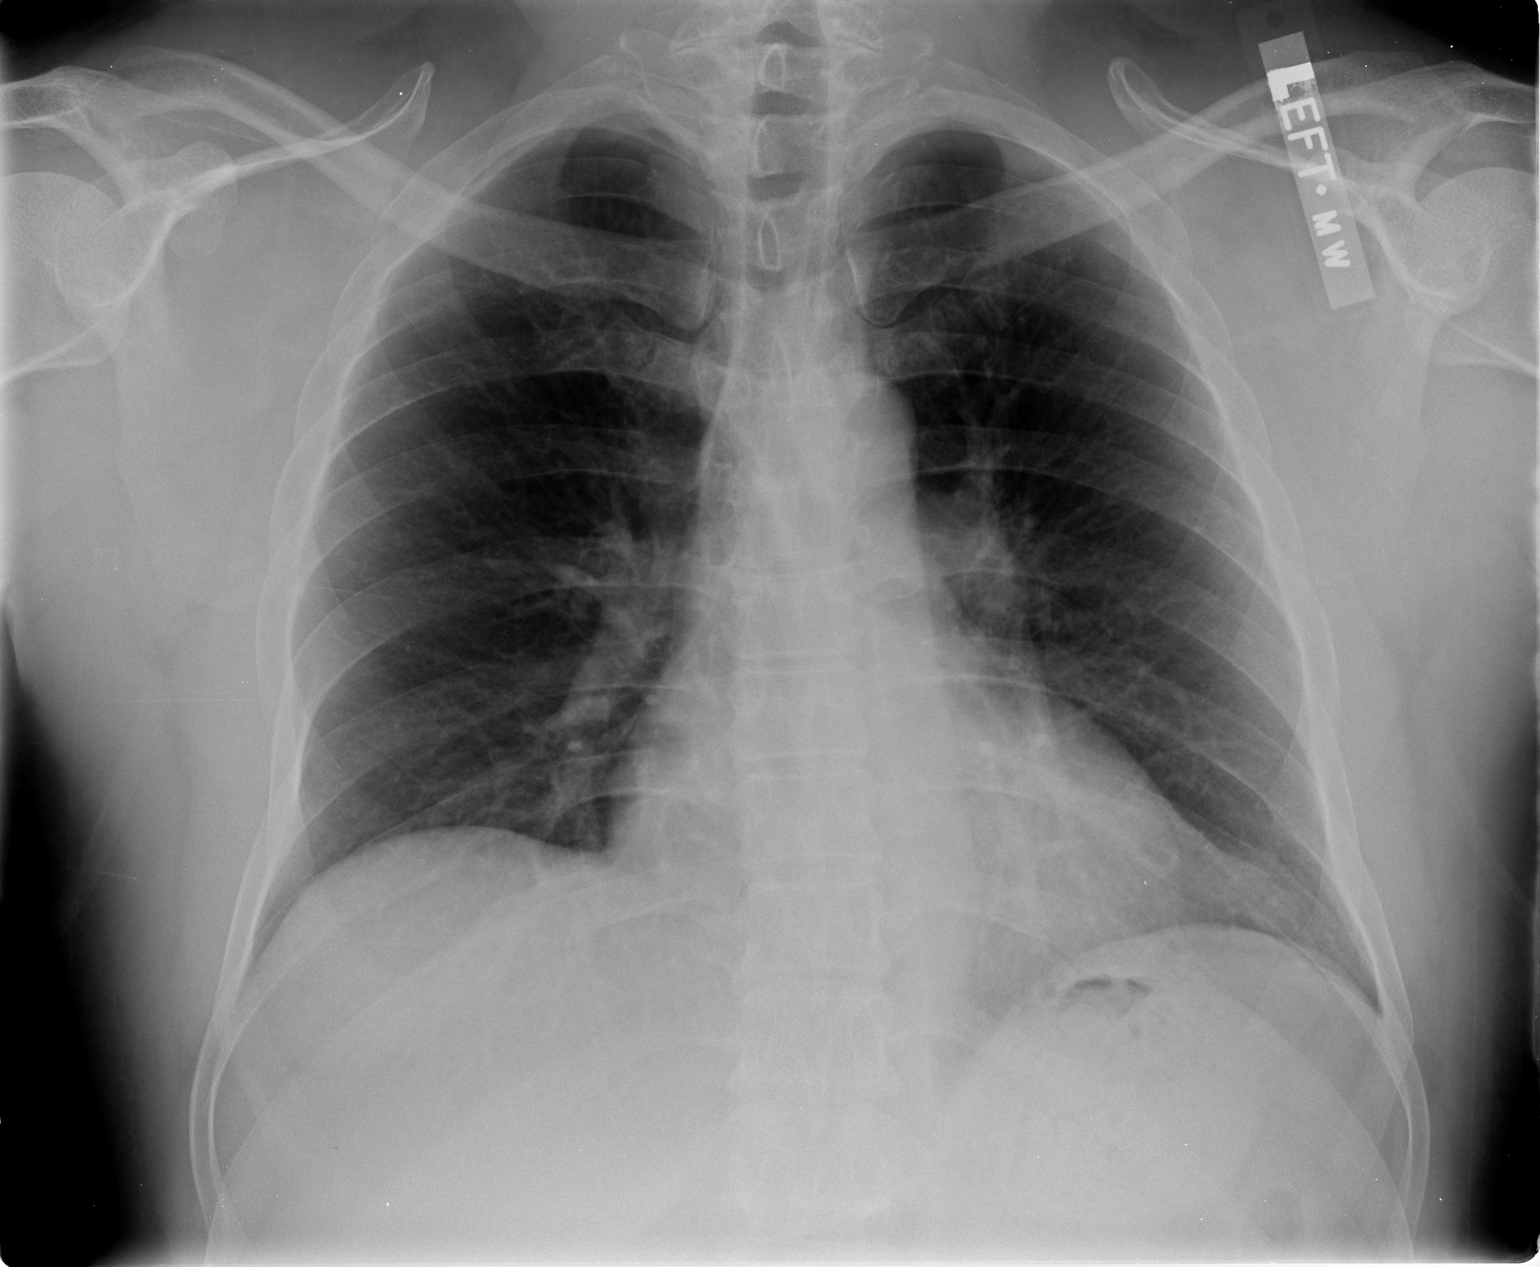

[view not recorded (2 of 2)]
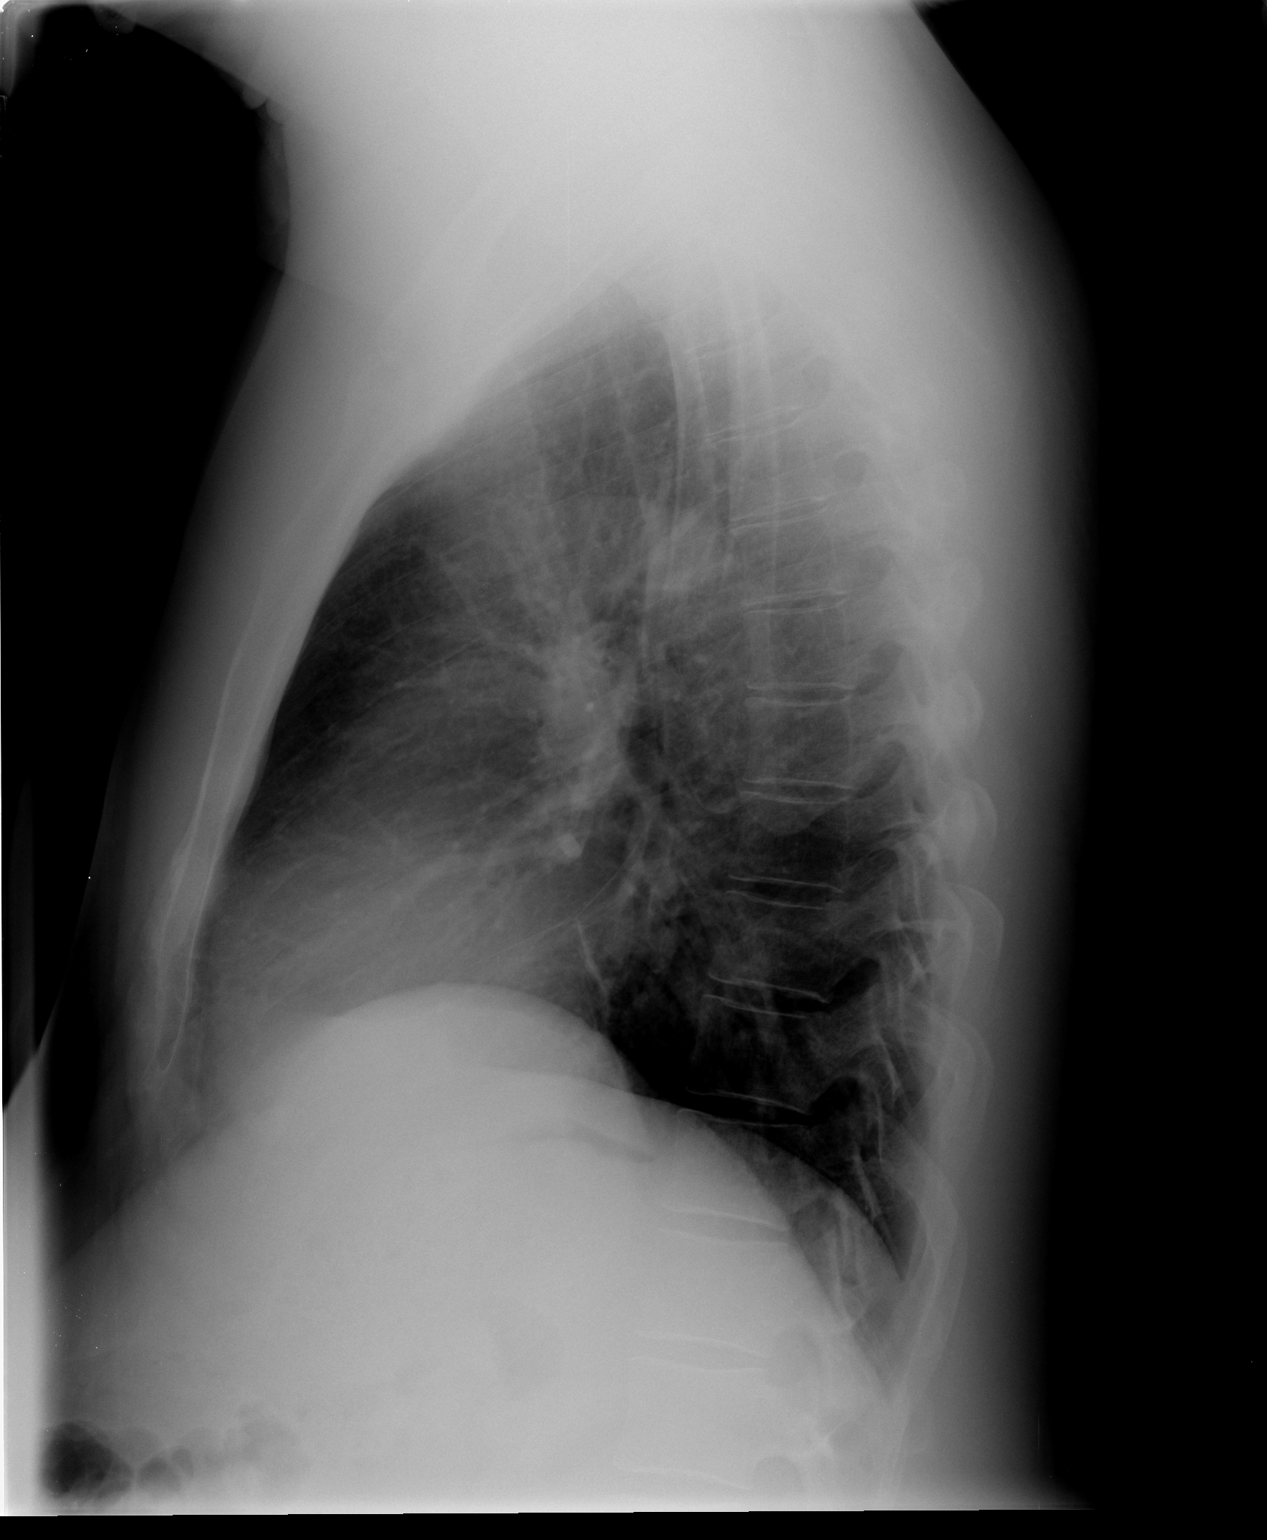

[2 of 2 positions shown; findings below may reference images not displayed]

FINDINGS: Heart and mediastinal contours are within normal limits.
No focal opacities or effusions.  No acute bony abnormality.
IMPRESSION: No active cardiopulmonary disease.

## 2016-04-05 ENCOUNTER — Encounter (HOSPITAL_COMMUNITY): Payer: Self-pay

## 2016-04-05 ENCOUNTER — Emergency Department (HOSPITAL_COMMUNITY)
Admission: EM | Admit: 2016-04-05 | Discharge: 2016-04-05 | Disposition: A | Discharge status: Home / Self Care | Payer: Medicaid Other | Attending: Emergency Medicine | Admitting: Emergency Medicine

## 2016-04-05 DIAGNOSIS — R197 Diarrhea, unspecified: Secondary | ICD-10-CM | POA: Diagnosis not present

## 2016-04-05 DIAGNOSIS — Z79899 Other long term (current) drug therapy: Secondary | ICD-10-CM | POA: Diagnosis not present

## 2016-04-05 DIAGNOSIS — E119 Type 2 diabetes mellitus without complications: Secondary | ICD-10-CM | POA: Insufficient documentation

## 2016-04-05 HISTORY — DX: Type 2 diabetes mellitus without complications: E11.9

## 2016-04-05 LAB — I-STAT CHEM 8, ED
BUN: 12 mg/dL (ref 6–20)
CALCIUM ION: 1.18 mmol/L (ref 1.15–1.40)
Chloride: 102 mmol/L (ref 101–111)
Creatinine, Ser: 1.1 mg/dL (ref 0.61–1.24)
GLUCOSE: 153 mg/dL — AB (ref 65–99)
HEMATOCRIT: 39 % (ref 39.0–52.0)
Hemoglobin: 13.3 g/dL (ref 13.0–17.0)
Potassium: 3.9 mmol/L (ref 3.5–5.1)
Sodium: 140 mmol/L (ref 135–145)
TCO2: 27 mmol/L (ref 0–100)

## 2016-04-05 MED ORDER — LOPERAMIDE HCL 2 MG PO CAPS: 2.0000 mg | ORAL_CAPSULE | Freq: Four times a day (QID) | ORAL | 0 refills | Status: DC | PRN

## 2016-04-05 NOTE — ED Provider Notes (Signed)
[facility]-EMERGENCY DEPT Provider Note   CSN: [id number] Arrival date & time: 04/05/16  0543     History   Chief Complaint Chief Complaint  Patient presents with  . Diarrhea    HPI Lucas White is a 56 y.o. male.  HPI Patient presents to the emergency room for evaluation of diarrhea. Patient states he cooked pork chop at home last night. Sometime after that meal he began having diarrhea. Patient had several episodes. He tried taking Pepto-Bismol without relief. In the middle night the patient woke up and had more diarrhea. He felt weak and felt like his heart was racing. He became concerned and was brought into the emergency room. He denies any trouble with abdominal pain. He denies any nausea or vomiting. While waiting in the emergency room his symptoms have resolved. He has not had any further episodes of diarrhea and currently he does not have any weakness or palpitations. He feels fine.  He denies any trouble with blood in his stool. No one else ate the same food as him.  Past Medical History:  Diagnosis Date  . Diabetes mellitus without complication (HCC)     There are no active problems to display for this patient.   History reviewed. No pertinent surgical history.     Home Medications    Prior to Admission medications   Medication Sig Start Date End Date Taking? Authorizing Provider  loperamide (IMODIUM) 2 MG capsule Take 1 capsule (2 mg total) by mouth 4 (four) times daily as needed for diarrhea or loose stools. 04/05/16   Linwood Dibbles, MD    Family History History reviewed. No pertinent family history.  Social History Social History  Substance Use Topics  . Smoking status: Never Smoker  . Smokeless tobacco: Never Used  . Alcohol use No     Allergies   Patient has no allergy information on record.   Review of Systems Review of Systems  All other systems reviewed and are negative.    Physical Exam Updated Vital Signs BP (!) 146/87 (BP Location: Left  Arm)   Pulse 80   Temp 98.4 F (36.9 C) (Oral)   Resp 18   SpO2 99%   Physical Exam  Constitutional: He appears well-developed and well-nourished. No distress.  HENT:  Head: Normocephalic and atraumatic.  Right Ear: External ear normal.  Left Ear: External ear normal.  Eyes: Conjunctivae are normal. Right eye exhibits no discharge. Left eye exhibits no discharge. No scleral icterus.  Neck: Neck supple. No tracheal deviation present.  Cardiovascular: Normal rate, regular rhythm and intact distal pulses.   Pulmonary/Chest: Effort normal and breath sounds normal. No stridor. No respiratory distress. He has no wheezes. He has no rales.  Abdominal: Soft. Bowel sounds are normal. He exhibits no distension. There is no tenderness. There is no rebound and no guarding.  Musculoskeletal: He exhibits no edema or tenderness.  Neurological: He is alert. He has normal strength. No cranial nerve deficit (no facial droop, extraocular movements intact, no slurred speech) or sensory deficit. He exhibits normal muscle tone. He displays no seizure activity. Coordination normal.  Skin: Skin is warm and dry. No rash noted.  Psychiatric: He has a normal mood and affect.  Nursing note and vitals reviewed.    ED Treatments / Results  Labs (all labs ordered are listed, but only abnormal results are displayed) Labs Reviewed  I-STAT CHEM 8, ED - Abnormal; Notable for the following:       Result Value  Glucose, Bld 153 (*)    All other components within normal limits     Procedures Procedures (including critical care time)  Medications Ordered in ED Medications - No data to display   Initial Impression / Assessment and Plan / ED Course  I have reviewed the triage vital signs and the nursing notes.  Pertinent labs & imaging results that were available during my care of the patient were reviewed by me and considered in my medical decision making (see chart for details).   patient's exam is benign.  He has no abdominal tenderness. Vital signs are stable. Laboratory tests are reassuring.   Symptoms could been related to a food born illness versus viral gastroenteritis.   Discharge home with prescription for Imodium. Patient will monitor for fever, blood in the stool or worsening symptoms.   Final Clinical Impressions(s) / ED Diagnoses   Final diagnoses:  Diarrhea of presumed infectious origin    New Prescriptions New Prescriptions   LOPERAMIDE (IMODIUM) 2 MG CAPSULE    Take 1 capsule (2 mg total) by mouth 4 (four) times daily as needed for diarrhea or loose stools.     Linwood Dibbles, MD 04/05/16 [phone]

## 2016-04-05 NOTE — Discharge Instructions (Signed)
Follow-up with your primary care doctor if the symptoms do not resolve in the next day or 2, take the Imodium as needed for diarrhea, monitor for fever or severe abdominal pain , blood in your stool

## 2016-04-05 NOTE — ED Triage Notes (Signed)
Pt states he cooked some pork last night and a few hours later he had several episodes of diarrhea

## 2016-09-11 ENCOUNTER — Emergency Department (HOSPITAL_BASED_OUTPATIENT_CLINIC_OR_DEPARTMENT_OTHER)
Admission: EM | Admit: 2016-09-11 | Discharge: 2016-09-11 | Disposition: A | Discharge status: Home / Self Care | Payer: Medicaid Other | Attending: Emergency Medicine | Admitting: Emergency Medicine

## 2016-09-11 ENCOUNTER — Encounter (HOSPITAL_BASED_OUTPATIENT_CLINIC_OR_DEPARTMENT_OTHER): Payer: Self-pay | Admitting: Emergency Medicine

## 2016-09-11 DIAGNOSIS — Z794 Long term (current) use of insulin: Secondary | ICD-10-CM | POA: Diagnosis not present

## 2016-09-11 DIAGNOSIS — K625 Hemorrhage of anus and rectum: Secondary | ICD-10-CM

## 2016-09-11 DIAGNOSIS — E119 Type 2 diabetes mellitus without complications: Secondary | ICD-10-CM | POA: Insufficient documentation

## 2016-09-11 HISTORY — DX: Other chronic pain: G89.29

## 2016-09-11 HISTORY — DX: Dorsalgia, unspecified: M54.9

## 2016-09-11 LAB — COMPREHENSIVE METABOLIC PANEL
ALT: 52 U/L (ref 17–63)
AST: 45 U/L — ABNORMAL HIGH (ref 15–41)
Albumin: 4.3 g/dL (ref 3.5–5.0)
Alkaline Phosphatase: 126 U/L (ref 38–126)
Anion gap: 10 (ref 5–15)
BUN: 9 mg/dL (ref 6–20)
CO2: 29 mmol/L (ref 22–32)
Calcium: 9.7 mg/dL (ref 8.9–10.3)
Chloride: 97 mmol/L — ABNORMAL LOW (ref 101–111)
Creatinine, Ser: 1.01 mg/dL (ref 0.61–1.24)
GFR calc Af Amer: >60 mL/min (ref >60)
GFR calc non Af Amer: >60 mL/min (ref >60)
Glucose, Bld: 209 mg/dL — ABNORMAL HIGH (ref 65–99)
Potassium: 4.1 mmol/L (ref 3.5–5.1)
Sodium: 136 mmol/L (ref 135–145)
Total Bilirubin: 0.5 mg/dL (ref 0.3–1.2)
Total Protein: 8.3 g/dL — ABNORMAL HIGH (ref 6.5–8.1)

## 2016-09-11 LAB — CBC WITH DIFFERENTIAL/PLATELET
Basophils Absolute: 0 10*3/uL (ref 0.0–0.1)
Basophils Relative: 1 %
Eosinophils Absolute: 0.2 10*3/uL (ref 0.0–0.7)
Eosinophils Relative: 3 %
HCT: 37.3 % — ABNORMAL LOW (ref 39.0–52.0)
Hemoglobin: 12.4 g/dL — ABNORMAL LOW (ref 13.0–17.0)
Lymphocytes Relative: 45 %
Lymphs Abs: 3.1 10*3/uL (ref 0.7–4.0)
MCH: 28.5 pg (ref 26.0–34.0)
MCHC: 33.2 g/dL (ref 30.0–36.0)
MCV: 85.7 fL (ref 78.0–100.0)
Monocytes Absolute: 0.3 10*3/uL (ref 0.1–1.0)
Monocytes Relative: 5 %
Neutro Abs: 3.2 10*3/uL (ref 1.7–7.7)
Neutrophils Relative %: 46 %
Platelets: 438 10*3/uL — ABNORMAL HIGH (ref 150–400)
RBC: 4.35 MIL/uL (ref 4.22–5.81)
RDW: 12.8 % (ref 11.5–15.5)
WBC: 6.8 10*3/uL (ref 4.0–10.5)

## 2016-09-11 LAB — OCCULT BLOOD X 1 CARD TO LAB, STOOL: Fecal Occult Bld: NEGATIVE

## 2016-09-11 MED ORDER — HYDROCORTISONE 2.5 % RE CREA: 1.0000 "application " | TOPICAL_CREAM | Freq: Two times a day (BID) | RECTAL | 0 refills | Status: DC

## 2016-09-11 NOTE — ED Triage Notes (Addendum)
Pt states he is to have his rectal bleeding checked.  Pt states he was sent to ED for evaluation.  Pt has had intermittent bleeding for one year.  Pt states he sees it when he wipes.

## 2016-09-11 NOTE — ED Provider Notes (Signed)
[facility]   CSN: [id number] Arrival date & time: 09/11/16  1749     History   Chief Complaint Chief Complaint  Patient presents with  . Rectal Bleeding    HPI Lucas White is a 56 y.o. male with history of diabetes who presents with rectal bleeding that has been intermittent for all of his adult life. It is becoming more frequent lately. It is usually bright red and only when he wipes. Patient also reports that he mostly has loose stools all the time. He does note that he drinks a lot of energy drinks. Patient denies any abdominal pain or anal/rectal pain. Patient has had a colonoscopy in the past one to 2 years which was clean. He did not mention this problem at the time, because he was not having any rectal bleeding at the time. He has never seen any melena. It has all been bright red.  HPI  Past Medical History:  Diagnosis Date  . Chronic back pain greater than 3 months duration   . Diabetes mellitus without complication (HCC)     There are no active problems to display for this patient.   Past Surgical History:  Procedure Laterality Date  . COLONOSCOPY         Home Medications    Prior to Admission medications   Medication Sig Start Date End Date Taking? Authorizing Provider  insulin glargine (LANTUS) 100 UNIT/ML injection Inject 30 Units into the skin at bedtime.   Yes [provider]  metFORMIN (GLUCOPHAGE) 500 MG tablet Take 500 mg by mouth 2 (two) times daily with a meal.   Yes [provider]  Oxycodone HCl 20 MG TABS Take 1 tablet by mouth every 6 (six) hours as needed.   Yes [provider]  hydrocortisone (ANUSOL-HC) 2.5 % rectal cream Place 1 application rectally 2 (two) times daily. 09/11/16   Emi Holes, PA-C    Family History No family history on file.  Social History Social History  Substance Use Topics  . Smoking status: Never Smoker  . Smokeless tobacco: Never Used  . Alcohol use  No     Allergies   Patient has no known allergies.   Review of Systems Review of Systems  Constitutional: Negative for chills and fever.  HENT: Negative for facial swelling and sore throat.   Respiratory: Negative for shortness of breath.   Cardiovascular: Negative for chest pain.  Gastrointestinal: Positive for blood in stool and diarrhea. Negative for abdominal pain, nausea and vomiting.  Genitourinary: Negative for dysuria.  Musculoskeletal: Negative for back pain.  Skin: Negative for rash and wound.  Neurological: Negative for headaches.  Psychiatric/Behavioral: The patient is not nervous/anxious.      Physical Exam Updated Vital Signs BP 132/74 (BP Location: Right Arm)   Pulse 68   Temp 98.4 F (36.9 C)   Resp 18   SpO2 99%   Physical Exam  Constitutional: He appears well-developed and well-nourished. No distress.  HENT:  Head: Normocephalic and atraumatic.  Mouth/Throat: Oropharynx is clear and moist. No oropharyngeal exudate.  Eyes: Pupils are equal, round, and reactive to light. Conjunctivae are normal. Right eye exhibits no discharge. Left eye exhibits no discharge. No scleral icterus.  Neck: Normal range of motion. Neck supple. No thyromegaly present.  Cardiovascular: Normal rate, regular rhythm, normal heart sounds and intact distal pulses.  Exam reveals no gallop and no friction rub.   No murmur heard. Pulmonary/Chest: Effort normal and breath sounds  normal. No stridor. No respiratory distress. He has no wheezes. He has no rales.  Abdominal: Soft. Bowel sounds are normal. He exhibits no distension. There is no tenderness. There is no rebound and no guarding.  Genitourinary: Rectal exam shows external hemorrhoid (small, not thrombosed or engorged). Rectal exam shows no internal hemorrhoid, no fissure and anal tone normal.  Musculoskeletal: He exhibits no edema.  Lymphadenopathy:    He has no cervical adenopathy.  Neurological: He is alert. Coordination  normal.  Skin: Skin is warm and dry. No rash noted. He is not diaphoretic. No pallor.  Psychiatric: He has a normal mood and affect.  Nursing note and vitals reviewed.    ED Treatments / Results  Labs (all labs ordered are listed, but only abnormal results are displayed) Labs Reviewed  COMPREHENSIVE METABOLIC PANEL - Abnormal; Notable for the following:       Result Value   Chloride 97 (*)    Glucose, Bld 209 (*)    Total Protein 8.3 (*)    AST 45 (*)    All other components within normal limits  CBC WITH DIFFERENTIAL/PLATELET - Abnormal; Notable for the following:    Hemoglobin 12.4 (*)    HCT 37.3 (*)    Platelets 438 (*)    All other components within normal limits  OCCULT BLOOD X 1 CARD TO LAB, STOOL    EKG  EKG Interpretation None       Radiology No results found.  Procedures Procedures (including critical care time)  Medications Ordered in ED Medications - No data to display   Initial Impression / Assessment and Plan / ED Course  I have reviewed the triage vital signs and the nursing notes.  Pertinent labs & imaging results that were available during my care of the patient were reviewed by me and considered in my medical decision making (see chart for details).     Patient with intermittent rectal bleeding. Fecal occult is negative here. Suspect hemorrhoid that is intermittently bleeding. Labs are stable. Patient with hemoglobin 12.4, which is stable for him. CMP shows chloride 97, glucose 209, AST 45. Patient reports that his glucose at 209 is fairly good for him. We'll treat with Anusol-HC follow-up to gastroenterology or PCP. Return precautions discussed. Patient understands and agrees with plan. Patient vitals stable throughout ED course and discharged in satisfactory condition.  Final Clinical Impressions(s) / ED Diagnoses   Final diagnoses:  Rectal bleeding    New Prescriptions New Prescriptions   HYDROCORTISONE (ANUSOL-HC) 2.5 % RECTAL CREAM     Place 1 application rectally 2 (two) times daily.     Emi HolesLaw, Jd Mccaster M, PA-C 09/11/16 2156    Loren RacerYelverton, David, MD 09/15/16 [phone]

## 2016-09-11 NOTE — Discharge Instructions (Signed)
Medications: Anusol-HC  Treatment: Apply Anusol-HC twice daily. Try to limit the amount of time you spend on the toilet and try not to strain.  Follow-up: Please follow up with your gastroenterologist or primary care provider for further evaluation and treatment. Please return to emergency department if you develop any new or worsening symptoms.

## 2018-12-13 ENCOUNTER — Emergency Department (HOSPITAL_COMMUNITY)
Admission: EM | Admit: 2018-12-13 | Discharge: 2018-12-13 | Disposition: A | Discharge status: Home / Self Care | Payer: Medicaid Other | Attending: Emergency Medicine | Admitting: Emergency Medicine

## 2018-12-13 ENCOUNTER — Encounter (HOSPITAL_COMMUNITY): Payer: Self-pay

## 2018-12-13 ENCOUNTER — Emergency Department (HOSPITAL_COMMUNITY): Payer: Medicaid Other

## 2018-12-13 ENCOUNTER — Other Ambulatory Visit: Payer: Self-pay

## 2018-12-13 DIAGNOSIS — R519 Headache, unspecified: Secondary | ICD-10-CM | POA: Diagnosis not present

## 2018-12-13 DIAGNOSIS — R531 Weakness: Secondary | ICD-10-CM | POA: Diagnosis not present

## 2018-12-13 DIAGNOSIS — R1084 Generalized abdominal pain: Secondary | ICD-10-CM | POA: Diagnosis not present

## 2018-12-13 DIAGNOSIS — R739 Hyperglycemia, unspecified: Secondary | ICD-10-CM

## 2018-12-13 DIAGNOSIS — U071 COVID-19: Secondary | ICD-10-CM | POA: Diagnosis not present

## 2018-12-13 DIAGNOSIS — Z79899 Other long term (current) drug therapy: Secondary | ICD-10-CM | POA: Insufficient documentation

## 2018-12-13 DIAGNOSIS — Z794 Long term (current) use of insulin: Secondary | ICD-10-CM | POA: Diagnosis not present

## 2018-12-13 DIAGNOSIS — E1165 Type 2 diabetes mellitus with hyperglycemia: Secondary | ICD-10-CM | POA: Diagnosis present

## 2018-12-13 LAB — URINALYSIS, ROUTINE W REFLEX MICROSCOPIC
Bacteria, UA: NONE SEEN
Bilirubin Urine: NEGATIVE
Glucose, UA: >=500 mg/dL — AB
Ketones, ur: 20 mg/dL — AB
Leukocytes,Ua: NEGATIVE
Nitrite: NEGATIVE
Protein, ur: 100 mg/dL — AB
Specific Gravity, Urine: 1.027 (ref 1.005–1.030)
pH: 5 (ref 5.0–8.0)

## 2018-12-13 LAB — COMPREHENSIVE METABOLIC PANEL
ALT: 63 U/L — ABNORMAL HIGH (ref 0–44)
AST: 50 U/L — ABNORMAL HIGH (ref 15–41)
Albumin: 4.5 g/dL (ref 3.5–5.0)
Alkaline Phosphatase: 131 U/L — ABNORMAL HIGH (ref 38–126)
Anion gap: 14 (ref 5–15)
BUN: 13 mg/dL (ref 6–20)
CO2: 22 mmol/L (ref 22–32)
Calcium: 8.9 mg/dL (ref 8.9–10.3)
Chloride: 93 mmol/L — ABNORMAL LOW (ref 98–111)
Creatinine, Ser: 1.2 mg/dL (ref 0.61–1.24)
GFR calc Af Amer: >60 mL/min (ref >60)
GFR calc non Af Amer: >60 mL/min (ref >60)
Glucose, Bld: 463 mg/dL — ABNORMAL HIGH (ref 70–99)
Potassium: 3.6 mmol/L (ref 3.5–5.1)
Sodium: 129 mmol/L — ABNORMAL LOW (ref 135–145)
Total Bilirubin: 1.3 mg/dL — ABNORMAL HIGH (ref 0.3–1.2)
Total Protein: 8.6 g/dL — ABNORMAL HIGH (ref 6.5–8.1)

## 2018-12-13 LAB — CBC WITH DIFFERENTIAL/PLATELET
Abs Immature Granulocytes: 0.02 10*3/uL (ref 0.00–0.07)
Basophils Absolute: 0 10*3/uL (ref 0.0–0.1)
Basophils Relative: 1 %
Eosinophils Absolute: 0.1 10*3/uL (ref 0.0–0.5)
Eosinophils Relative: 2 %
HCT: 38.6 % — ABNORMAL LOW (ref 39.0–52.0)
Hemoglobin: 13.1 g/dL (ref 13.0–17.0)
Immature Granulocytes: 0 %
Lymphocytes Relative: 24 %
Lymphs Abs: 1.4 10*3/uL (ref 0.7–4.0)
MCH: 28.8 pg (ref 26.0–34.0)
MCHC: 33.9 g/dL (ref 30.0–36.0)
MCV: 84.8 fL (ref 80.0–100.0)
Monocytes Absolute: 0.5 10*3/uL (ref 0.1–1.0)
Monocytes Relative: 8 %
Neutro Abs: 3.8 10*3/uL (ref 1.7–7.7)
Neutrophils Relative %: 65 %
Platelets: 368 10*3/uL (ref 150–400)
RBC: 4.55 MIL/uL (ref 4.22–5.81)
RDW: 11.8 % (ref 11.5–15.5)
WBC: 5.8 10*3/uL (ref 4.0–10.5)
nRBC: 0 % (ref 0.0–0.2)

## 2018-12-13 LAB — CBG MONITORING, ED
Glucose-Capillary: 375 mg/dL — ABNORMAL HIGH (ref 70–99)
Glucose-Capillary: 364 mg/dL — ABNORMAL HIGH (ref 70–99)

## 2018-12-13 LAB — POC SARS CORONAVIRUS 2 AG -  ED: SARS Coronavirus 2 Ag: POSITIVE — AB

## 2018-12-13 LAB — CK: Total CK: 400 U/L — ABNORMAL HIGH (ref 49–397)

## 2018-12-13 LAB — TROPONIN I (HIGH SENSITIVITY)
Troponin I (High Sensitivity): 4 ng/L (ref <18)
Troponin I (High Sensitivity): 4 ng/L (ref <18)

## 2018-12-13 LAB — LACTIC ACID, PLASMA: Lactic Acid, Venous: 1.8 mmol/L (ref 0.5–1.9)

## 2018-12-13 MED ORDER — SODIUM CHLORIDE 0.9 % IV BOLUS
1000.0000 mL | Freq: Once | INTRAVENOUS | Status: AC
  Administered 2018-12-13: 1000 mL via INTRAVENOUS

## 2018-12-13 MED ORDER — INSULIN ASPART 100 UNIT/ML ~~LOC~~ SOLN
8.0000 [IU] | Freq: Once | SUBCUTANEOUS | Status: AC
  Administered 2018-12-13: 8 [IU] via SUBCUTANEOUS
  Filled 2018-12-13: qty 0.08

## 2018-12-13 MED ORDER — OXYCODONE HCL 5 MG PO TABS
20.0000 mg | ORAL_TABLET | Freq: Once | ORAL | Status: AC
  Administered 2018-12-13: 20 mg via ORAL
  Filled 2018-12-13: qty 4

## 2018-12-13 MED ORDER — ONDANSETRON HCL 4 MG/2ML IJ SOLN
4.0000 mg | Freq: Once | INTRAMUSCULAR | Status: AC
  Administered 2018-12-13: 4 mg via INTRAVENOUS
  Filled 2018-12-13: qty 2

## 2018-12-13 MED ORDER — IOHEXOL 300 MG/ML  SOLN
100.0000 mL | Freq: Once | INTRAMUSCULAR | Status: AC | PRN
  Administered 2018-12-13: 100 mL via INTRAVENOUS

## 2018-12-13 MED ORDER — SODIUM CHLORIDE (PF) 0.9 % IJ SOLN
INTRAMUSCULAR | Status: AC
  Administered 2018-12-13: 10 mL
  Filled 2018-12-13: qty 50

## 2018-12-13 MED ORDER — FENTANYL CITRATE (PF) 100 MCG/2ML IJ SOLN
50.0000 ug | Freq: Once | INTRAMUSCULAR | Status: AC
  Administered 2018-12-13: 50 ug via INTRAVENOUS
  Filled 2018-12-13: qty 2

## 2018-12-13 NOTE — ED Notes (Signed)
Patient called his wife and states she will be here to pick him up in approx 30 minutes. patient remains in the room.

## 2018-12-13 NOTE — ED Notes (Signed)
Patient is very disrespectful to Cytogeneticist. Patient has taken his IV out, fluid and blood covered floor. Patient placed on bedside commode. Very demanding to Cytogeneticist. Asking for pain medicine and to call his wife and tell her he has Covid. for him. Explained once his IV is re-established we can work on getting him pain medicine. Explained that we cannot by law(HIPPA) call his wife to tell her that he has Covid 70. Patient verbalized understanding.

## 2018-12-13 NOTE — ED Notes (Signed)
This Probation officer had patient ambulate arund bed to asess oxygen saturation while ambulating. Patient started at 100% on room air, trended down to 97%, then down to 93%. Patient's lowest O2 was 91% and it jumped up to 93% and then 97% and back to 100%. Patient thinks he might "need to stay another day."

## 2018-12-13 NOTE — ED Provider Notes (Signed)
[facility]-EMERGENCY DEPT Provider Note   CSN: [id number] Arrival date & time: 12/13/18  [id number]     History Chief Complaint  Patient presents with  . Hyperglycemia  . Fever    Lucas White is a 58 y.o. male.  Level 5 caveat due to pain.  Patient here with hyperglycemia.  States he has chronic pain in his legs and his back and normally takes oxycodone 20 mg every 6 hours.  States he has been out for the past 2 days.  Complains of severe pain in his right leg and his back which is his usual pain but worse.  Denies any new trauma.  He denies any fever but is found to be febrile on arrival 100.2.  Has had nausea, but no vomiting.  States his sugars have been high because not taking his diabetic medication for several days either.  Complains of pain to his back and his leg but denies any pain with urination or blood in the urine.  No chest pain or shortness of breath.  No focal weakness, numbness or tingling.  States he had one episode of not being able to make it to the bathroom on time but did not have any incontinence without knowledge.  States he feels weak in the leg due to pain and he does not want tingling. EMS reports he was "talking out of his head" and was uncooperative.  He denies any alcohol or illicit drug use.  The history is provided by the patient.  Hyperglycemia Associated symptoms: fatigue, fever, nausea and weakness   Associated symptoms: no abdominal pain, no chest pain, no dizziness, no dysuria, no shortness of breath and no vomiting   Fever Associated symptoms: congestion, headaches, myalgias, nausea and rhinorrhea   Associated symptoms: no chest pain, no cough, no dysuria and no vomiting        Past Medical History:  Diagnosis Date  . Chronic back pain greater than 3 months duration   . Diabetes mellitus without complication (HCC)     There are no problems to display for this patient.   Past Surgical History:  Procedure Laterality Date    . BACK SURGERY    . COLONOSCOPY         Family History  Family history unknown: Yes    Social History   Tobacco Use  . Smoking status: Never Smoker  . Smokeless tobacco: Never Used  Substance Use Topics  . Alcohol use: No  . Drug use: Never    Home Medications Prior to Admission medications   Medication Sig Start Date End Date Taking? Authorizing Provider  hydrocortisone (ANUSOL-HC) 2.5 % rectal cream Place 1 application rectally 2 (two) times daily. 09/11/16   Law, Waylan BogaAlexandra M, PA-C  insulin glargine (LANTUS) 100 UNIT/ML injection Inject 30 Units into the skin at bedtime.    [provider]  metFORMIN (GLUCOPHAGE) 500 MG tablet Take 500 mg by mouth 2 (two) times daily with a meal.    [provider]  Oxycodone HCl 20 MG TABS Take 1 tablet by mouth every 6 (six) hours as needed.    [provider]    Allergies    Patient has no known allergies.  Review of Systems   Review of Systems  Constitutional: Positive for activity change, appetite change, fatigue and fever.  HENT: Positive for congestion and rhinorrhea.   Eyes: Negative for visual disturbance.  Respiratory: Negative for cough, chest tightness and shortness of breath.   Cardiovascular: Negative  for chest pain.  Gastrointestinal: Positive for nausea. Negative for abdominal pain and vomiting.  Genitourinary: Negative for dysuria and hematuria.  Musculoskeletal: Positive for arthralgias, back pain and myalgias.  Neurological: Positive for weakness and headaches. Negative for dizziness.   all other systems are negative except as noted in the HPI and PMH.    Physical Exam Updated Vital Signs BP 133/79   Pulse (!) 104   Temp 100.2 F (37.9 C) (Oral)   Ht  (1.803 m)   Wt 99.8 kg   SpO2 95%   BMI 30.68 kg/m   Physical Exam Vitals and nursing note reviewed.  Constitutional:      General: He is not in acute distress.    Appearance: He is well-developed.     Comments:  Distress from pain  HENT:     Head: Normocephalic and atraumatic.     Mouth/Throat:     Pharynx: No oropharyngeal exudate.  Eyes:     Conjunctiva/sclera: Conjunctivae normal.     Pupils: Pupils are equal, round, and reactive to light.  Neck:     Comments: No meningismus. Cardiovascular:     Rate and Rhythm: Normal rate and regular rhythm.     Heart sounds: Normal heart sounds. No murmur.  Pulmonary:     Effort: Pulmonary effort is normal. No respiratory distress.     Breath sounds: Normal breath sounds.  Abdominal:     Palpations: Abdomen is soft.     Tenderness: There is no abdominal tenderness. There is no guarding or rebound.  Musculoskeletal:        General: Tenderness present. Normal range of motion.     Cervical back: Normal range of motion and neck supple.     Comments: Paraspinal lumbar tenderness bilaterally, no midline tenderness  Pain to palpation of right thigh and lower leg without crepitance or ecchymosis.  Intact DP and PT pulses bilaterally  Decreased strength right leg 4/5 compared to left 5/5 is likely pain and effort dependent.  Skin:    General: Skin is warm.  Neurological:     Mental Status: He is alert and oriented to person, place, and time.     Cranial Nerves: No cranial nerve deficit.     Motor: No abnormal muscle tone.     Coordination: Coordination normal.     Comments: No ataxia on finger to nose bilaterally. No pronator drift. 5/5 strength throughout. CN 2-12 intact.Equal grip strength. Sensation intact.   Psychiatric:        Behavior: Behavior normal.     ED Results / Procedures / Treatments   Labs (all labs ordered are listed, but only abnormal results are displayed) Labs Reviewed  CBC WITH DIFFERENTIAL/PLATELET - Abnormal; Notable for the following components:      Result Value   HCT 38.6 (*)    All other components within normal limits  COMPREHENSIVE METABOLIC PANEL - Abnormal; Notable for the following components:   Sodium 129 (*)     Chloride 93 (*)    Glucose, Bld 463 (*)    Total Protein 8.6 (*)    AST 50 (*)    ALT 63 (*)    Alkaline Phosphatase 131 (*)    Total Bilirubin 1.3 (*)    All other components within normal limits  URINALYSIS, ROUTINE W REFLEX MICROSCOPIC - Abnormal; Notable for the following components:   Glucose, UA >=500 (*)    Hgb urine dipstick MODERATE (*)    Ketones, ur 20 (*)  Protein, ur 100 (*)    All other components within normal limits  CK - Abnormal; Notable for the following components:   Total CK 400 (*)    All other components within normal limits  CBG MONITORING, ED - Abnormal; Notable for the following components:   Glucose-Capillary 375 (*)    All other components within normal limits  POC SARS CORONAVIRUS 2 AG -  ED - Abnormal; Notable for the following components:   SARS Coronavirus 2 Ag POSITIVE (*)    All other components within normal limits  CBG MONITORING, ED - Abnormal; Notable for the following components:   Glucose-Capillary 364 (*)    All other components within normal limits  CULTURE, BLOOD (ROUTINE X 2)  CULTURE, BLOOD (ROUTINE X 2)  LACTIC ACID, PLASMA  LACTIC ACID, PLASMA  BLOOD GAS, VENOUS  TROPONIN I (HIGH SENSITIVITY)  TROPONIN I (HIGH SENSITIVITY)    EKG None  Radiology CT Head Wo Contrast  Result Date: 12/13/2018 CLINICAL DATA:  COVID-19 positive. Headaches. EXAM: CT HEAD WITHOUT CONTRAST TECHNIQUE: Contiguous axial images were obtained from the base of the skull through the vertex without intravenous contrast. COMPARISON:  None. FINDINGS: Brain: No evidence of acute infarction, hemorrhage, hydrocephalus, extra-axial collection or mass lesion/mass effect. Vascular: No hyperdense vessel. Skull: Normal. Negative for fracture or focal lesion. Sinuses/Orbits: No acute finding. Other: None. IMPRESSION: No focal acute intracranial abnormality identified. Electronically Signed   By: Abelardo Diesel M.D.   On: 12/13/2018 13:11   CT ABDOMEN PELVIS W  CONTRAST  Result Date: 12/13/2018 CLINICAL DATA:  COVID-19 positive. Generalized abdomen pain. EXAM: CT ABDOMEN AND PELVIS WITH CONTRAST TECHNIQUE: Multidetector CT imaging of the abdomen and pelvis was performed using the standard protocol following bolus administration of intravenous contrast. CONTRAST:  11mL OMNIPAQUE IOHEXOL 300 MG/ML  SOLN COMPARISON:  February 16, 2008 FINDINGS: Lower chest: Mild patchy opacity is identified in bilateral lower lobes more prominently involving the right lower lobe. The heart size is normal. Hepatobiliary: There is diffuse low density of liver. No focal liver lesion is identified. The gallbladder is normal. The biliary tree is normal. Pancreas: Unremarkable. No pancreatic ductal dilatation or surrounding inflammatory changes. Spleen: Normal in size without focal abnormality. Adrenals/Urinary Tract: Left adrenal nodule is unchanged compared prior exam. The right adrenal gland is normal. Nonobstructing stones are identified in both kidneys. No hydronephrosis is identified bilaterally. Bladder is normal. Stomach/Bowel: Stomach is within normal limits. Appendix appears normal. No evidence of bowel wall thickening, distention, or inflammatory changes. Vascular/Lymphatic: Aortic atherosclerosis. No enlarged abdominal or pelvic lymph nodes. Reproductive: Prostate is unremarkable. Other: None. Musculoskeletal: Degenerative joint changes of the spine are noted. IMPRESSION: 1. Mild patchy consolidation identified in bilateral lower lobes, more prominently involving the right lower lobe suspicious for pneumonia. 2. No acute abnormality identified in the abdomen and pelvis. 3. Fatty infiltration of liver. 4. Nonobstructing stones in both kidneys. Electronically Signed   By: Abelardo Diesel M.D.   On: 12/13/2018 13:16   DG Chest Portable 1 View  Result Date: 12/13/2018 CLINICAL DATA:  Altered mental status. EXAM: PORTABLE CHEST 1 VIEW COMPARISON:  08/03/2010 FINDINGS: Normal sized  heart. Clear lungs. Unremarkable bones. IMPRESSION: No acute abnormality. Electronically Signed   By: Claudie Revering M.D.   On: 12/13/2018 10:17    Procedures Procedures (including critical care time)  Medications Ordered in ED Medications  sodium chloride 0.9 % bolus 1,000 mL (has no administration in time range)  fentaNYL (SUBLIMAZE) injection 50 mcg (has no  administration in time range)  ondansetron (ZOFRAN) injection 4 mg (has no administration in time range)  oxyCODONE (Oxy IR/ROXICODONE) immediate release tablet 20 mg (has no administration in time range)    ED Course  I have reviewed the triage vital signs and the nursing notes.  Pertinent labs & imaging results that were available during my care of the patient were reviewed by me and considered in my medical decision making (see chart for details).    MDM Rules/Calculators/A&P     CHA2DS2/VAS Stroke Risk Points      N/A >= 2 Points: High Risk  1 - 1.99 Points: Medium Risk  0 Points: Low Risk    A final score could not be computed because of missing components.: Last  Change: N/A     This score determines the patient's risk of having a stroke if the  patient has atrial fibrillation.      This score is not applicable to this patient. Components are not  calculated.                  Patient here with chronic back and leg pain.  Review of narcotic database shows he receives 120 oxycodone 20 mg tablets per month by Dr. Joseph Art.  These were last filled on November 27. Patient States he is out and cannot specify why.  Patient clarifies.  He does not think he is out of his pain medication but does not know where it is.  He states he has had some confusion and is not sure where it is.  He not been taking his pain medicine oxycodone as regularly as he normally does not because he is out but because he is confused.   Labs show hyperglycemia without DKA.  Patient given hydration as well as symptom control.  Chest x-ray is  negative.  Coronavirus test is positive.  Patient found to have hyperglycemia without DKA.  He has been noncompliant with his medications.  Work-up is otherwise reassuring.  He is able to ambulate without hypoxia and tolerate p.o. CXR negative.   He denies any difficulty breathing, chest pain or shortness of breath.  He states he has his medications at home does not need prescriptions.  He does not know where his pain medication is.  Discussed with patient that the ED cannot provide chronic pain medication prescriptions. Advised that he should call the police if he thinks it has been stolen.   He states he has been confused at home but here is oriented x3, talking on phone in no distress. CT head negative. No meningismus. No new neurological deficits.   Advised to keep himself quarantined for 2 weeks now that he is coronavirus positive.  Take his medications as prescribed and follow-up with his primary doctor. Return precautions discussed. D/w patient importance of compliance with his medications for his blood pressure and blood sugar.  BP (!) 128/95   Pulse 79   Temp 98.4 F (36.9 C) (Oral)   Ht 5\' 11"  (1.803 m)   Wt 99.8 kg   SpO2 98%   BMI 30.68 kg/m   Lucas White was evaluated in Emergency Department on 12/13/2018 for the symptoms described in the history of present illness. He was evaluated in the context of the global COVID-19 pandemic, which necessitated consideration that the patient might be at risk for infection with the SARS-CoV-2 virus that causes COVID-19. Institutional protocols and algorithms that pertain to the evaluation of patients at risk for COVID-19 are in a  state of rapid change based on information released by regulatory bodies including the CDC and federal and state organizations. These policies and algorithms were followed during the patient's care in the ED.   Final Clinical Impression(s) / ED Diagnoses Final diagnoses:  COVID-19 virus infection   Hyperglycemia    Rx / DC Orders ED Discharge Orders    None       Tiphani Mells, Jeannett Senior, MD 12/13/18 [place]

## 2018-12-13 NOTE — ED Triage Notes (Addendum)
Per EMS- patient reports that he has not been taking his meds and he is out of pain meds and is c/o bilateral leg pain.. EMS reported that the patient was agitated.  [id number]  Patient stated during triage. "I can't find my pain meds. I have been acting a fool." Patient asked several times if he could have pain meds.

## 2018-12-13 NOTE — Discharge Instructions (Addendum)
Take your medications as prescribed.  Keep yourself quarantined for 2 weeks since your coronavirus test is positive.  Do not go to work tonight go to school.  Return to the ED with chest pain, difficulty breathing, any other concerns.

## 2018-12-13 NOTE — ED Notes (Signed)
Patient given water for fluid challenge.  

## 2018-12-18 LAB — CULTURE, BLOOD (ROUTINE X 2)
Culture: NO GROWTH
Culture: NO GROWTH
Special Requests: ADEQUATE

## 2018-12-19 ENCOUNTER — Emergency Department (HOSPITAL_COMMUNITY): Payer: Medicaid Other

## 2018-12-19 ENCOUNTER — Inpatient Hospital Stay (HOSPITAL_COMMUNITY)
Admission: EM | Admit: 2018-12-19 | Discharge: 2018-12-23 | DRG: 871 | Disposition: A | Discharge status: Home / Self Care | Payer: Medicaid Other | Attending: Internal Medicine | Admitting: Internal Medicine

## 2018-12-19 ENCOUNTER — Encounter (HOSPITAL_COMMUNITY): Payer: Self-pay | Admitting: Internal Medicine

## 2018-12-19 ENCOUNTER — Other Ambulatory Visit: Payer: Self-pay

## 2018-12-19 DIAGNOSIS — Z794 Long term (current) use of insulin: Secondary | ICD-10-CM

## 2018-12-19 DIAGNOSIS — U071 COVID-19: Secondary | ICD-10-CM | POA: Diagnosis present

## 2018-12-19 DIAGNOSIS — R410 Disorientation, unspecified: Secondary | ICD-10-CM | POA: Diagnosis not present

## 2018-12-19 DIAGNOSIS — E860 Dehydration: Secondary | ICD-10-CM | POA: Diagnosis not present

## 2018-12-19 DIAGNOSIS — M6282 Rhabdomyolysis: Secondary | ICD-10-CM | POA: Diagnosis not present

## 2018-12-19 DIAGNOSIS — A4189 Other specified sepsis: Secondary | ICD-10-CM | POA: Diagnosis present

## 2018-12-19 DIAGNOSIS — Z79891 Long term (current) use of opiate analgesic: Secondary | ICD-10-CM | POA: Diagnosis not present

## 2018-12-19 DIAGNOSIS — M549 Dorsalgia, unspecified: Secondary | ICD-10-CM | POA: Diagnosis present

## 2018-12-19 DIAGNOSIS — Z833 Family history of diabetes mellitus: Secondary | ICD-10-CM | POA: Diagnosis not present

## 2018-12-19 DIAGNOSIS — E119 Type 2 diabetes mellitus without complications: Secondary | ICD-10-CM

## 2018-12-19 DIAGNOSIS — Z6827 Body mass index (BMI) 27.0-27.9, adult: Secondary | ICD-10-CM

## 2018-12-19 DIAGNOSIS — J9601 Acute respiratory failure with hypoxia: Secondary | ICD-10-CM | POA: Diagnosis not present

## 2018-12-19 DIAGNOSIS — R6520 Severe sepsis without septic shock: Secondary | ICD-10-CM | POA: Diagnosis present

## 2018-12-19 DIAGNOSIS — M545 Low back pain: Secondary | ICD-10-CM | POA: Diagnosis not present

## 2018-12-19 DIAGNOSIS — E1165 Type 2 diabetes mellitus with hyperglycemia: Secondary | ICD-10-CM

## 2018-12-19 DIAGNOSIS — E872 Acidosis: Secondary | ICD-10-CM | POA: Diagnosis present

## 2018-12-19 DIAGNOSIS — G92 Toxic encephalopathy: Secondary | ICD-10-CM | POA: Diagnosis present

## 2018-12-19 DIAGNOSIS — N179 Acute kidney failure, unspecified: Secondary | ICD-10-CM | POA: Diagnosis present

## 2018-12-19 DIAGNOSIS — E669 Obesity, unspecified: Secondary | ICD-10-CM | POA: Diagnosis not present

## 2018-12-19 DIAGNOSIS — J1282 Pneumonia due to coronavirus disease 2019: Secondary | ICD-10-CM

## 2018-12-19 DIAGNOSIS — E66811 Obesity, class 1: Secondary | ICD-10-CM | POA: Diagnosis present

## 2018-12-19 DIAGNOSIS — G9341 Metabolic encephalopathy: Secondary | ICD-10-CM | POA: Diagnosis not present

## 2018-12-19 DIAGNOSIS — J1289 Other viral pneumonia: Secondary | ICD-10-CM | POA: Diagnosis present

## 2018-12-19 DIAGNOSIS — E869 Volume depletion, unspecified: Secondary | ICD-10-CM | POA: Diagnosis present

## 2018-12-19 DIAGNOSIS — G8929 Other chronic pain: Secondary | ICD-10-CM | POA: Diagnosis present

## 2018-12-19 DIAGNOSIS — T40605A Adverse effect of unspecified narcotics, initial encounter: Secondary | ICD-10-CM | POA: Diagnosis present

## 2018-12-19 DIAGNOSIS — J189 Pneumonia, unspecified organism: Secondary | ICD-10-CM | POA: Diagnosis not present

## 2018-12-19 DIAGNOSIS — R748 Abnormal levels of other serum enzymes: Secondary | ICD-10-CM | POA: Diagnosis not present

## 2018-12-19 HISTORY — DX: Obesity, class 1: E66.811

## 2018-12-19 HISTORY — DX: Obesity, unspecified: E66.9

## 2018-12-19 HISTORY — DX: COVID-19: U07.1

## 2018-12-19 HISTORY — DX: Type 2 diabetes mellitus without complications: E11.9

## 2018-12-19 HISTORY — DX: Pneumonia due to coronavirus disease 2019: J12.82

## 2018-12-19 LAB — EXPECTORATED SPUTUM ASSESSMENT W GRAM STAIN, RFLX TO RESP C

## 2018-12-19 LAB — RAPID URINE DRUG SCREEN, HOSP PERFORMED
Amphetamines: NOT DETECTED
Barbiturates: NOT DETECTED
Benzodiazepines: NOT DETECTED
Cocaine: NOT DETECTED
Opiates: POSITIVE — AB
Tetrahydrocannabinol: NOT DETECTED

## 2018-12-19 LAB — INFLUENZA PANEL BY PCR (TYPE A & B)
Influenza A By PCR: NEGATIVE
Influenza B By PCR: NEGATIVE

## 2018-12-19 LAB — CBC WITH DIFFERENTIAL/PLATELET
Abs Immature Granulocytes: 0.04 10*3/uL (ref 0.00–0.07)
Basophils Absolute: 0 10*3/uL (ref 0.0–0.1)
Basophils Relative: 0 %
Eosinophils Absolute: 0 10*3/uL (ref 0.0–0.5)
Eosinophils Relative: 0 %
HCT: 33.2 % — ABNORMAL LOW (ref 39.0–52.0)
Hemoglobin: 11.3 g/dL — ABNORMAL LOW (ref 13.0–17.0)
Immature Granulocytes: 1 %
Lymphocytes Relative: 15 %
Lymphs Abs: 1.1 10*3/uL (ref 0.7–4.0)
MCH: 29.4 pg (ref 26.0–34.0)
MCHC: 34 g/dL (ref 30.0–36.0)
MCV: 86.5 fL (ref 80.0–100.0)
Monocytes Absolute: 0.3 10*3/uL (ref 0.1–1.0)
Monocytes Relative: 4 %
Neutro Abs: 5.7 10*3/uL (ref 1.7–7.7)
Neutrophils Relative %: 80 %
Platelets: 289 10*3/uL (ref 150–400)
RBC: 3.84 MIL/uL — ABNORMAL LOW (ref 4.22–5.81)
RDW: 12.1 % (ref 11.5–15.5)
WBC: 7.2 10*3/uL (ref 4.0–10.5)
nRBC: 0 % (ref 0.0–0.2)

## 2018-12-19 LAB — PHOSPHORUS: Phosphorus: 3.8 mg/dL (ref 2.5–4.6)

## 2018-12-19 LAB — URINALYSIS, ROUTINE W REFLEX MICROSCOPIC
Bilirubin Urine: NEGATIVE
Glucose, UA: >=500 mg/dL — AB
Ketones, ur: NEGATIVE mg/dL
Leukocytes,Ua: NEGATIVE
Nitrite: NEGATIVE
Protein, ur: 100 mg/dL — AB
Specific Gravity, Urine: 1.027 (ref 1.005–1.030)
pH: 5 (ref 5.0–8.0)

## 2018-12-19 LAB — COMPREHENSIVE METABOLIC PANEL
ALT: 42 U/L (ref 0–44)
AST: 53 U/L — ABNORMAL HIGH (ref 15–41)
Albumin: 3 g/dL — ABNORMAL LOW (ref 3.5–5.0)
Alkaline Phosphatase: 82 U/L (ref 38–126)
Anion gap: 15 (ref 5–15)
BUN: 30 mg/dL — ABNORMAL HIGH (ref 6–20)
CO2: 27 mmol/L (ref 22–32)
Calcium: 7.9 mg/dL — ABNORMAL LOW (ref 8.9–10.3)
Chloride: 89 mmol/L — ABNORMAL LOW (ref 98–111)
Creatinine, Ser: 1.51 mg/dL — ABNORMAL HIGH (ref 0.61–1.24)
GFR calc Af Amer: 59 mL/min — ABNORMAL LOW (ref >60)
GFR calc non Af Amer: 51 mL/min — ABNORMAL LOW (ref >60)
Glucose, Bld: 471 mg/dL — ABNORMAL HIGH (ref 70–99)
Potassium: 4.5 mmol/L (ref 3.5–5.1)
Sodium: 131 mmol/L — ABNORMAL LOW (ref 135–145)
Total Bilirubin: 0.8 mg/dL (ref 0.3–1.2)
Total Protein: 7.4 g/dL (ref 6.5–8.1)

## 2018-12-19 LAB — LACTATE DEHYDROGENASE: LDH: 377 U/L — ABNORMAL HIGH (ref 98–192)

## 2018-12-19 LAB — BLOOD GAS, VENOUS
Acid-base deficit: 2 mmol/L (ref 0.0–2.0)
Bicarbonate: 24.3 mmol/L (ref 20.0–28.0)
O2 Saturation: 26.4 %
Patient temperature: 98.6
pCO2, Ven: 47.7 mmHg (ref 44.0–60.0)
pH, Ven: 7.327 (ref 7.250–7.430)
pO2, Ven: <31 mmHg — CL (ref 32.0–45.0)

## 2018-12-19 LAB — CBG MONITORING, ED
Glucose-Capillary: 459 mg/dL — ABNORMAL HIGH (ref 70–99)
Glucose-Capillary: 367 mg/dL — ABNORMAL HIGH (ref 70–99)

## 2018-12-19 LAB — D-DIMER, QUANTITATIVE: D-Dimer, Quant: 0.95 ug/mL-FEU — ABNORMAL HIGH (ref 0.00–0.50)

## 2018-12-19 LAB — ABO/RH: ABO/RH(D): O POS

## 2018-12-19 LAB — MAGNESIUM: Magnesium: 2.9 mg/dL — ABNORMAL HIGH (ref 1.7–2.4)

## 2018-12-19 LAB — STREP PNEUMONIAE URINARY ANTIGEN: Strep Pneumo Urinary Antigen: NEGATIVE

## 2018-12-19 LAB — LACTIC ACID, PLASMA
Lactic Acid, Venous: 2.4 mmol/L (ref 0.5–1.9)
Lactic Acid, Venous: 2.4 mmol/L (ref 0.5–1.9)

## 2018-12-19 LAB — TROPONIN I (HIGH SENSITIVITY)
Troponin I (High Sensitivity): 4 ng/L (ref <18)
Troponin I (High Sensitivity): 5 ng/L (ref <18)

## 2018-12-19 LAB — PROCALCITONIN: Procalcitonin: 6.86 ng/mL

## 2018-12-19 LAB — GLUCOSE, CAPILLARY
Glucose-Capillary: 305 mg/dL — ABNORMAL HIGH (ref 70–99)
Glucose-Capillary: 195 mg/dL — ABNORMAL HIGH (ref 70–99)
Glucose-Capillary: 181 mg/dL — ABNORMAL HIGH (ref 70–99)

## 2018-12-19 LAB — FERRITIN: Ferritin: 430 ng/mL — ABNORMAL HIGH (ref 24–336)

## 2018-12-19 LAB — TRIGLYCERIDES: Triglycerides: 171 mg/dL — ABNORMAL HIGH (ref <150)

## 2018-12-19 LAB — C-REACTIVE PROTEIN: CRP: 16.8 mg/dL — ABNORMAL HIGH (ref <1.0)

## 2018-12-19 LAB — BRAIN NATRIURETIC PEPTIDE: B Natriuretic Peptide: 16.4 pg/mL (ref 0.0–100.0)

## 2018-12-19 LAB — FIBRINOGEN: Fibrinogen: 768 mg/dL — ABNORMAL HIGH (ref 210–475)

## 2018-12-19 MED ORDER — IPRATROPIUM-ALBUTEROL 20-100 MCG/ACT IN AERS
2.0000 | INHALATION_SPRAY | Freq: Three times a day (TID) | RESPIRATORY_TRACT | Status: DC
  Administered 2018-12-20 – 2018-12-23 (×10): 2 via RESPIRATORY_TRACT
  Filled 2018-12-19: qty 4

## 2018-12-19 MED ORDER — LORAZEPAM 2 MG/ML IJ SOLN
1.0000 mg | Freq: Once | INTRAMUSCULAR | Status: AC | PRN
  Administered 2018-12-19: 1 mg via INTRAVENOUS
  Filled 2018-12-19: qty 1

## 2018-12-19 MED ORDER — IPRATROPIUM-ALBUTEROL 20-100 MCG/ACT IN AERS
1.0000 | INHALATION_SPRAY | Freq: Four times a day (QID) | RESPIRATORY_TRACT | Status: DC | PRN
  Filled 2018-12-19: qty 4

## 2018-12-19 MED ORDER — HYDROMORPHONE HCL 1 MG/ML IJ SOLN
1.0000 mg | Freq: Once | INTRAMUSCULAR | Status: AC
  Administered 2018-12-19: 1 mg via INTRAVENOUS
  Filled 2018-12-19: qty 1

## 2018-12-19 MED ORDER — INSULIN DETEMIR 100 UNIT/ML ~~LOC~~ SOLN
0.1500 [IU]/kg | Freq: Two times a day (BID) | SUBCUTANEOUS | Status: DC
  Administered 2018-12-19 – 2018-12-23 (×9): 15 [IU] via SUBCUTANEOUS
  Filled 2018-12-19 (×11): qty 0.15

## 2018-12-19 MED ORDER — INSULIN ASPART 100 UNIT/ML ~~LOC~~ SOLN
20.0000 [IU] | Freq: Once | SUBCUTANEOUS | Status: AC
  Administered 2018-12-19: 20 [IU] via SUBCUTANEOUS
  Filled 2018-12-19: qty 0.2

## 2018-12-19 MED ORDER — HYDROCODONE-ACETAMINOPHEN 5-325 MG PO TABS: 1.0000 | ORAL_TABLET | Freq: Four times a day (QID) | ORAL | Status: DC | PRN

## 2018-12-19 MED ORDER — SODIUM CHLORIDE 0.9 % IV SOLN
200.0000 mg | Freq: Once | INTRAVENOUS | Status: AC
  Administered 2018-12-19: 200 mg via INTRAVENOUS
  Filled 2018-12-19: qty 40

## 2018-12-19 MED ORDER — GUAIFENESIN-DM 100-10 MG/5ML PO SYRP
10.0000 mL | ORAL_SOLUTION | ORAL | Status: DC | PRN
  Administered 2018-12-22 (×2): 10 mL via ORAL
  Filled 2018-12-19 (×2): qty 10

## 2018-12-19 MED ORDER — SODIUM CHLORIDE 0.9 % IV SOLN
500.0000 mg | INTRAVENOUS | Status: DC
  Administered 2018-12-19 – 2018-12-21 (×3): 500 mg via INTRAVENOUS
  Filled 2018-12-19 (×5): qty 500

## 2018-12-19 MED ORDER — HYDROCOD POLST-CPM POLST ER 10-8 MG/5ML PO SUER
5.0000 mL | Freq: Two times a day (BID) | ORAL | Status: DC | PRN
  Administered 2018-12-19 – 2018-12-22 (×2): 5 mL via ORAL
  Filled 2018-12-19 (×2): qty 5

## 2018-12-19 MED ORDER — SODIUM CHLORIDE 0.45 % IV BOLUS
1000.0000 mL | Freq: Once | INTRAVENOUS | Status: AC
  Administered 2018-12-19: 1000 mL via INTRAVENOUS

## 2018-12-19 MED ORDER — ASCORBIC ACID 500 MG PO TABS
500.0000 mg | ORAL_TABLET | Freq: Every day | ORAL | Status: DC
  Administered 2018-12-19 – 2018-12-23 (×5): 500 mg via ORAL
  Filled 2018-12-19 (×5): qty 1

## 2018-12-19 MED ORDER — SODIUM CHLORIDE 0.9 % IV SOLN
100.0000 mg | Freq: Every day | INTRAVENOUS | Status: AC
  Administered 2018-12-20 – 2018-12-23 (×4): 100 mg via INTRAVENOUS
  Filled 2018-12-19 (×4): qty 100

## 2018-12-19 MED ORDER — VITAMIN D (ERGOCALCIFEROL) 1.25 MG (50000 UNIT) PO CAPS
50000.0000 [IU] | ORAL_CAPSULE | ORAL | Status: DC
  Administered 2018-12-19: 50000 [IU] via ORAL
  Filled 2018-12-19: qty 1

## 2018-12-19 MED ORDER — ACETAMINOPHEN 500 MG PO TABS
1000.0000 mg | ORAL_TABLET | Freq: Once | ORAL | Status: AC
  Administered 2018-12-19: 1000 mg via ORAL
  Filled 2018-12-19: qty 2

## 2018-12-19 MED ORDER — METHYLPREDNISOLONE SODIUM SUCC 125 MG IJ SOLR
125.0000 mg | Freq: Once | INTRAMUSCULAR | Status: AC
  Administered 2018-12-19: 125 mg via INTRAVENOUS
  Filled 2018-12-19: qty 2

## 2018-12-19 MED ORDER — ENOXAPARIN SODIUM 40 MG/0.4ML ~~LOC~~ SOLN
40.0000 mg | SUBCUTANEOUS | Status: DC
  Administered 2018-12-19 – 2018-12-23 (×5): 40 mg via SUBCUTANEOUS
  Filled 2018-12-19 (×6): qty 0.4

## 2018-12-19 MED ORDER — IPRATROPIUM-ALBUTEROL 20-100 MCG/ACT IN AERS
2.0000 | INHALATION_SPRAY | Freq: Four times a day (QID) | RESPIRATORY_TRACT | Status: DC
  Administered 2018-12-19 (×2): 2 via RESPIRATORY_TRACT
  Filled 2018-12-19 (×2): qty 4

## 2018-12-19 MED ORDER — ACETAMINOPHEN 650 MG RE SUPP: 650.0000 mg | Freq: Four times a day (QID) | RECTAL | Status: DC | PRN

## 2018-12-19 MED ORDER — ZINC SULFATE 220 (50 ZN) MG PO CAPS
220.0000 mg | ORAL_CAPSULE | Freq: Every day | ORAL | Status: DC
  Administered 2018-12-19 – 2018-12-23 (×5): 220 mg via ORAL
  Filled 2018-12-19 (×5): qty 1

## 2018-12-19 MED ORDER — SODIUM CHLORIDE 0.9 % IV BOLUS: 500.0000 mL | Freq: Once | INTRAVENOUS | Status: DC

## 2018-12-19 MED ORDER — ACETAMINOPHEN 325 MG PO TABS
650.0000 mg | ORAL_TABLET | Freq: Four times a day (QID) | ORAL | Status: DC | PRN
  Administered 2018-12-22 (×2): 650 mg via ORAL
  Filled 2018-12-19 (×2): qty 2

## 2018-12-19 MED ORDER — INSULIN ASPART 100 UNIT/ML ~~LOC~~ SOLN
0.0000 [IU] | SUBCUTANEOUS | Status: DC
  Administered 2018-12-19: 4 [IU] via SUBCUTANEOUS
  Administered 2018-12-19: 15 [IU] via SUBCUTANEOUS
  Administered 2018-12-19: 4 [IU] via SUBCUTANEOUS
  Administered 2018-12-20: 7 [IU] via SUBCUTANEOUS
  Administered 2018-12-20: 4 [IU] via SUBCUTANEOUS
  Filled 2018-12-19: qty 0.2

## 2018-12-19 MED ORDER — METHYLPREDNISOLONE SODIUM SUCC 125 MG IJ SOLR
0.5000 mg/kg | Freq: Two times a day (BID) | INTRAMUSCULAR | Status: DC
  Administered 2018-12-19 – 2018-12-23 (×8): 50 mg via INTRAVENOUS
  Filled 2018-12-19 (×8): qty 2

## 2018-12-19 MED ORDER — SODIUM CHLORIDE 0.9 % IV SOLN
2.0000 g | INTRAVENOUS | Status: AC
  Administered 2018-12-19 – 2018-12-23 (×5): 2 g via INTRAVENOUS
  Filled 2018-12-19: qty 2
  Filled 2018-12-19: qty 20
  Filled 2018-12-19 (×3): qty 2

## 2018-12-19 NOTE — Progress Notes (Signed)
Pt arrived from ED at 11:15 am. Pt noted with AMS, pt has become increasingly restless and confused. Pulling off gown and telemetry monitor. Pt calling family using profanity. Reassured pt safety. Pt thinks he is in [place], [place] with friends at a hospital. Again, reoriented pt. MD updated and orders received and given at 1430. Pt currently much calmer and resting with eyes closed. Easily aroused. Sat on RA 95%. Will cont to monitor. SRP, RN

## 2018-12-19 NOTE — H&P (Signed)
History and Physical    Lucas White JHE:[id number] DOB: 12-22-1960 DOA: 12/19/2018  PCP: Windy Fast, MD   Patient coming from: Home.  I have personally briefly reviewed patient's old medical records in [place]  Chief Complaint: Shortness of breath.  HPI: Lucas White is a 58 y.o. male with medical history significant of chronic back pain, obesity, type 2 diabetes currently, COVID-19 +6 days ago who is off he is regular medications and insulin returning today to the emergency department due to progressively worse dyspnea and weakness.  EMS on arrival found his vital signs to be BP 100/70, HR 97, RR 24, SPO2 74% on RA and CBG of 533.  He did not provide much history, but states that his does not feel good.  He does not know that he has had a fever, chills or night sweats.  Currently, he denies headache, chest pain, abdominal pain, diarrhea, nausea or urinary symptoms.  However, he continues to be unable to elaborate further on his symptoms.  Per Dr. Gareth Morgan note on 12/13/2018, the patient was acting similarly.  I spoke to the patient's wife who stated that this has been a chronic issue and he used to be on anxiolytics.  ED Course: Initial vital signs temperature 98.7 F, pulse 94, respirations 27, blood pressure 119/70 mmHg and O2 sat 94% on room air.  He received 1000 mg of acetaminophen p.o. in the ED.  I added a 1000 mL 0.45% sodium chloride bolus, NovoLog 20 units SQ, ceftriaxone/Zithromax IVPB and Solu-Medrol 125 mg.  He is urinalysis showed glucosuria more than 500 mg/dL, moderate hemoglobinuria, proteinuria of 100 mg/dL.  Microscopic semination showed rare bacteria.  Venous blood gas showed a PO2 of less than 18mHg, the rest of the measurements were normal.  His white count was 7.2, hemoglobin 11.3 g/dL and platelets 289.  Lactic acid was 2.4 twice.  Sodium 131, potassium 4.5, chloride 89 and CO2 27 mmol/L.  BUN 30, creatinine 1.51 and glucose 471.  (Most recent creatinine  was 1.20).  Total protein 7.4, albumin 3.0 g/dL.  AST slightly elevated at 53, but normal ALT, alk phos and total bilirubin.  LDH was 377, triglycerides 171 CRP 16.8, ferritin 430, D-dimer 0.95 and fibrinogen 768.  Review of Systems: As per HPI otherwise 10 point review of systems negative.   Past Medical History:  Diagnosis Date  . Chronic back pain greater than 3 months duration   . Obesity (BMI 30.0-34.9)   . Type 2 diabetes mellitus ([place]     Past Surgical History:  Procedure Laterality Date  . BACK SURGERY    . COLONOSCOPY       reports that he has never smoked. He has never used smokeless tobacco. He reports that he does not drink alcohol or use drugs.  No Known Allergies  Family History  Problem Relation Age of Onset  . Diabetes Father   The patient's wife also stated that there are multiple family members who had been diagnosed with either bipolar disorder or schizophrenia.  His mother also was diagnosed with Alzheimer's dementia.   Prior to Admission medications   Medication Sig Start Date End Date Taking? Authorizing Provider  HYDROcodone-acetaminophen (NORCO/VICODIN) 5-325 MG tablet Take 1 tablet by mouth 3 (three) times daily. 11/11/18   [provider]  hydrocortisone (ANUSOL-HC) 2.5 % rectal cream Place 1 application rectally 2 (two) times daily. 09/11/16   Law, ABea Graff PA-C  insulin glargine (LANTUS) 100 UNIT/ML injection Inject 30 Units into  the skin at bedtime.    [provider]  metFORMIN (GLUCOPHAGE) 500 MG tablet Take 500 mg by mouth 2 (two) times daily with a meal.    [provider]  Oxycodone HCl 20 MG TABS Take 1 tablet by mouth every 6 (six) hours as needed.    [provider]    Physical Exam: Vitals:   12/19/18 0554 12/19/18 0918  BP: 119/78 115/63  Pulse: 94 89  Resp: (!) 27 (!) 24  Temp: 98.7 F (37.1 C)   TempSrc: Oral   SpO2: 94% 96%    Constitutional: Mildly anxious, but otherwise in NAD. Eyes:  PERRL, lids and conjunctivae mildly injected ENMT: Mucous membranes are moist. Posterior pharynx clear of any exudate or lesions. Neck: normal, supple, no masses, no thyromegaly Respiratory: Mildly tachypneic with RR in the mid 20s (Oxygen placed for comfort with improvement on his O2 sat and below 20 RR).  No wheezing, no rhonchi, bilateral rales.  No accessory muscle use.  Cardiovascular: Regular rate and rhythm, no murmurs / rubs / gallops. No extremity edema. 2+ pedal pulses. No carotid bruits.  Abdomen: Nondistended, soft, no tenderness, no masses palpated. No hepatosplenomegaly. Bowel sounds positive.  Musculoskeletal: no clubbing / cyanosis.  Good ROM, no contractures. Normal muscle tone.  Skin: no rashes, lesions, ulcers on very limited dermatological examination Neurologic: CN 2-12 grossly intact. Sensation intact, DTR normal. Strength 5/5 in all 4.  Psychiatric: Alert and oriented x 1, partially oriented to place and date.Marland Kitchen  He was unable to tell me the name of the president.  Labs on Admission: I have personally reviewed following labs and imaging studies  CBC: Recent Labs  Lab 12/13/18 1003 12/19/18 0519  WBC 5.8 7.2  NEUTROABS 3.8 5.7  HGB 13.1 11.3*  HCT 38.6* 33.2*  MCV 84.8 86.5  PLT 368 389   Basic Metabolic Panel: Recent Labs  Lab 12/13/18 1003 12/19/18 0654  NA 129* 131*  K 3.6 4.5  CL 93* 89*  CO2 22 27  GLUCOSE 463* 471*  BUN 13 30*  CREATININE 1.20 1.51*  CALCIUM 8.9 7.9*  MG  --  2.9*  PHOS  --  3.8   GFR: Estimated Creatinine Clearance: 65.8 mL/min (A) (by C-G formula based on SCr of 1.51 mg/dL (H)). Liver Function Tests: Recent Labs  Lab 12/13/18 1003 12/19/18 0654  AST 50* 53*  ALT 63* 42  ALKPHOS 131* 82  BILITOT 1.3* 0.8  PROT 8.6* 7.4  ALBUMIN 4.5 3.0*   No results for input(s): LIPASE, AMYLASE in the last 168 hours. No results for input(s): AMMONIA in the last 168 hours. Coagulation Profile: No results for input(s): INR, PROTIME  in the last 168 hours. Cardiac Enzymes: Recent Labs  Lab 12/13/18 1003  CKTOTAL 400*   BNP (last 3 results) No results for input(s): PROBNP in the last 8760 hours. HbA1C: No results for input(s): HGBA1C in the last 72 hours. CBG: Recent Labs  Lab 12/13/18 0939 12/13/18 1421 12/19/18 0553 12/19/18 0932  GLUCAP 375* 364* 459* 367*   Lipid Profile: Recent Labs    12/19/18 0654  TRIG 171*   Thyroid Function Tests: No results for input(s): TSH, T4TOTAL, FREET4, T3FREE, THYROIDAB in the last 72 hours. Anemia Panel: Recent Labs    12/19/18 0654  FERRITIN 430*   Urine analysis:    Component Value Date/Time   COLORURINE YELLOW 12/19/2018 [place] 12/19/2018 0721   LABSPEC 1.027 12/19/2018 0721   PHURINE 5.0 12/19/2018  0721   GLUCOSEU >=500 (A) 12/19/2018 0721   HGBUR MODERATE (A) 12/19/2018 0721   BILIRUBINUR NEGATIVE 12/19/2018 0721   KETONESUR NEGATIVE 12/19/2018 0721   PROTEINUR 100 (A) 12/19/2018 0721   NITRITE NEGATIVE 12/19/2018 0721   LEUKOCYTESUR NEGATIVE 12/19/2018 0721    Radiological Exams on Admission: DG Chest Port 1 View  Result Date: 12/19/2018 CLINICAL DATA:  COVID-19. Shortness of breath. EXAM: PORTABLE CHEST 1 VIEW COMPARISON:  Radiograph 6 days ago 12/13/2018 FINDINGS: Progressive patchy opacities throughout both lungs from prior exam, basilar predominant. Unchanged heart size and mediastinal contours. No pleural fluid or pneumothorax. No acute osseous abnormalities are seen. IMPRESSION: Progressive patchy opacities throughout both lungs, consistent with multifocal pneumonia. Electronically Signed   By: Keith Rake M.D.   On: 12/19/2018 05:44    EKG: Independently reviewed.  Vent. rate 90 BPM PR interval * ms QRS duration 88 ms QT/QTc 372/456 ms P-R-T axes 72 77 58 Sinus rhythm Low voltage, extremity leads Minimal ST elevation, inferior leads Baseline wander in lead(s) V3  Assessment/Plan Principal Problem:    Pneumonia due to COVID-19 virus Admit to telemetry/inpatient. Continue supplemental oxygen. Scheduled and as needed bronchodilators. Analgesics, antiemetics and antitussives as needed. Solu-Medrol loading dose, then 0.5 mg/kg every 12 hours. Elevated pro calcitonin ceftriaxone/Zithromax started. Follow-up CBC, CMP and inflammatory markers.  Active Problems:   Type 2 diabetes mellitus ([place]) The patient has not been on treatment. Time-limited IV hydration. Resume insulin glargine 15 units SQ twice daily. CBG monitoring every 4 hours with RI SS while on steroids.    Volume depletion Likely due to fevers and hyperglycemia. Continue bolus and time-limited IV fluids.    Confusion and disorientation This was reported 6 days ago by Dr. Kingsley Callander. CT scan done on that ER visit was negative. Per wife, this has been going on for a while. He has previously been on anxiolytics, but no longer taking them. Per wife, family history of bipolar disorder, schizophrenia. She also stated the patient's mother with hx of Alzheimer's dementia. Check B12, TSH and RPR. Advised the wife about [facility] follow up    Chronic back pain greater than 3 months duration Continue hydrocodone/APAP 5/325 mg p.o. as needed.    Obesity (BMI 30.0-34.9) Will need lifestyle modifications.   DVT prophylaxis: Lovenox SQ. Code Status: Full code. Family Communication: Called wife Lucas White from the patient's cell phone and then again from the office at [phone].  A message was left for her to call back to the office number. Disposition Plan: Admit for COVID-19 pneumonia with possible bacterial pneumonia and uncontrolled type 2 diabetes. Consults called: Admission status: Inpatient/telemetry.   Reubin Milan MD [facility]  If 7PM-7AM, please contact night-coverage [email or web address]  12/19/2018, 10:45 AM   This document was prepared using Dragon voice recognition software and may contain some unintended  transcription errors.

## 2018-12-19 NOTE — ED Triage Notes (Signed)
Per EMS: Pt is coming from home with c/o progressively worsening covid diagnoses. Pt was diagnosed positive on November 11th. Pt has increasingly SOB and letheragy. Pt was able to stand and ambulate with EMS.  EMS VITALS: BP 100/70 HR 97 RR 24 SPO2 74% RA CBG 533  REPEAT EMS VITALS: BP 118/84 SPO2 99 NRB TEMP 96.8  600 fluids

## 2018-12-19 NOTE — ED Notes (Signed)
Patient is refusing for this writer to complete fluid administration and obtain labs. This Probation officer will make provider aware.

## 2018-12-19 NOTE — ED Provider Notes (Signed)
Lucas White   CSN: [id number] Arrival date & time: 12/19/18  0505     History Chief Complaint  Patient presents with  . COVID +    Lucas White is a 58 y.o. male.  Patient with past medical history notable for diabetes and chronic back pain with recent Covid-19 diagnosis, presents to the emergency department with a chief complaint of worsening shortness of breath.  Per EMS, the patient was 74% on room air.  He reports feeling increasing fatigue.  His blood glucose per EMS is 533.  No treatments prior to arrival.  Activity makes his symptoms worse.  The history is provided by the patient. No language interpreter was used.       Past Medical History:  Diagnosis Date  . Chronic back pain greater than 3 months duration   . Diabetes mellitus without complication ([place])     There are no problems to display for this patient.   Past Surgical History:  Procedure Laterality Date  . BACK SURGERY    . COLONOSCOPY         Family History  Family history unknown: Yes    Social History   Tobacco Use  . Smoking status: Never Smoker  . Smokeless tobacco: Never Used  Substance Use Topics  . Alcohol use: No  . Drug use: Never    Home Medications Prior to Admission medications   Medication Sig Start Date End Date Taking? Authorizing Provider  hydrocortisone (ANUSOL-HC) 2.5 % rectal cream Place 1 application rectally 2 (two) times daily. 09/11/16   Law, Bea Graff, Lucas White  insulin glargine (LANTUS) 100 UNIT/ML injection Inject 30 Units into the skin at bedtime.    [provider]  metFORMIN (GLUCOPHAGE) 500 MG tablet Take 500 mg by mouth 2 (two) times daily with a meal.    [provider]  Oxycodone HCl 20 MG TABS Take 1 tablet by mouth every 6 (six) hours as needed.    [provider]    Allergies    Patient has no known allergies.  Review of Systems   Review of Systems  All other systems  reviewed and are negative.   Physical Exam Updated Vital Signs There were no vitals taken for this visit.  Physical Exam Vitals and nursing White reviewed.  Constitutional:      Appearance: He is well-developed.  HENT:     Head: Normocephalic and atraumatic.  Eyes:     Conjunctiva/sclera: Conjunctivae normal.  Cardiovascular:     Rate and Rhythm: Normal rate and regular rhythm.     Heart sounds: No murmur.  Pulmonary:     Effort: No respiratory distress.     Breath sounds: Normal breath sounds.     Comments: Tachypneic, no wheezing Abdominal:     Palpations: Abdomen is soft.     Tenderness: There is no abdominal tenderness.  Musculoskeletal:     Cervical back: Neck supple.  Skin:    General: Skin is warm and dry.  Neurological:     Mental Status: He is alert.  Psychiatric:     Comments: Depressed, withdrawn     ED Results / Procedures / Treatments   Labs (all labs ordered are listed, but only abnormal results are displayed) Labs Reviewed  CULTURE, BLOOD (ROUTINE X 2)  CULTURE, BLOOD (ROUTINE X 2)  CBC WITH DIFFERENTIAL/PLATELET  LACTIC ACID, PLASMA  LACTIC ACID, PLASMA  COMPREHENSIVE METABOLIC PANEL  D-DIMER, QUANTITATIVE (NOT AT [facility])  PROCALCITONIN  LACTATE DEHYDROGENASE  FERRITIN  TRIGLYCERIDES  FIBRINOGEN  C-REACTIVE PROTEIN  CBG MONITORING, ED    EKG None  Radiology No results found.  Procedures .Critical Care Performed by: Lucas Horseman, Lucas White Authorized by: Lucas Horseman, Lucas White   Critical care provider statement:    Critical care time (minutes):  30   Critical care was necessary to treat or prevent imminent or life-threatening deterioration of the following conditions:  Respiratory failure   Critical care was time spent personally by me on the following activities:  Discussions with consultants, evaluation of patient's response to treatment, examination of patient, ordering and performing treatments and interventions, ordering and review  of laboratory studies, ordering and review of radiographic studies, pulse oximetry, re-evaluation of patient's condition, obtaining history from patient or surrogate and review of old charts   (including critical care time)  Medications Ordered in ED Medications - No data to display  ED Course  I have reviewed the triage vital signs and the nursing notes.  Pertinent labs & imaging results that were available during my care of the patient were reviewed by me and considered in my medical decision making (see chart for details).    MDM Rules/Calculators/A&P                      Patient with COVID-19.  Recent positive test 1 week ago.  Reports worsening symptoms.  74% on RA at home per EMS.    O2 sat 90-91 at rest on RA here, RR 27. Will repeat CXR and check labs.  Patient also has poorly controlled DM.  Glucose is 459 here.  Will likely need admission based on worsening symptoms and significant O2 demand.  Patient signed out to [place], [place].    Plan: Follow-up on labs, ambulate with O2 monitor, likely admit.   JAESON MOLSTAD was evaluated in Emergency Department on 12/19/2018 for the symptoms described in the history of present illness. He was evaluated in the context of the global COVID-19 pandemic, which necessitated consideration that the patient might be at risk for infection with the SARS-CoV-2 virus that causes COVID-19. Institutional protocols and algorithms that pertain to the evaluation of patients at risk for COVID-19 are in a state of rapid change based on information released by regulatory bodies including the CDC and federal and state organizations. These policies and algorithms were followed during the patient's care in the ED.   Final Clinical Impression(s) / ED Diagnoses Final diagnoses:  COVID-19 virus infection    Rx / DC Orders ED Discharge Orders    None       Lucas Horseman, Lucas White 12/19/18 9937    Lucas White, Lucas Cower, MD 12/19/18 0700    Lucas White, Lucas Cower,  MD 12/24/18 [phone]

## 2018-12-19 NOTE — ED Provider Notes (Signed)
Care assumed at shift change from [name], pending labs and re-evaluation. See his note for full HPI and workup. Briefly, Lucas White presenting with known covid illness, diagnosed on 11/13/2018, with worsening SOB. Lucas White brought in by EMS with O2 sat of 74% on RA, however Lucas White O2 on RA here is around 90%. Lucas White is tachypneic high 20s. Pending labs, however will likely need admission. Physical Exam  BP 119/78 (BP Location: Left Arm)   Pulse 94   Temp 98.7 F (37.1 C) (Oral)   Resp (!) 27   SpO2 94%   Physical Exam Vitals and nursing note reviewed.  Constitutional:      Appearance: He is well-developed.  HENT:     Head: Normocephalic and atraumatic.  Eyes:     Conjunctiva/sclera: Conjunctivae normal.  Pulmonary:     Comments: Lucas White is tachypneic, speaking in short phrases with inc work of breathing Abdominal:     Palpations: Abdomen is soft.  Skin:    General: Skin is warm.  Neurological:     Mental Status: He is alert.  Psychiatric:        Behavior: Behavior normal.     ED Course/Procedures   Results for orders placed or performed during the hospital encounter of 12/19/18  CBC with Differential  Result Value Ref Range   WBC 7.2 4.0 - 10.5 K/uL   RBC 3.84 (L) 4.22 - 5.81 MIL/uL   Hemoglobin 11.3 (L) 13.0 - 17.0 g/dL   HCT 16.133.2 (L) 09.639.0 - 04.552.0 %   MCV 86.5 80.0 - 100.0 fL   MCH 29.4 26.0 - 34.0 pg   MCHC 34.0 30.0 - 36.0 g/dL   RDW 40.912.1 81.111.5 - 91.415.5 %   Platelets 289 150 - 400 K/uL   nRBC 0.0 0.0 - 0.2 %   Neutrophils Relative % 80 %   Neutro Abs 5.7 1.7 - 7.7 K/uL   Lymphocytes Relative 15 %   Lymphs Abs 1.1 0.7 - 4.0 K/uL   Monocytes Relative 4 %   Monocytes Absolute 0.3 0.1 - 1.0 K/uL   Eosinophils Relative 0 %   Eosinophils Absolute 0.0 0.0 - 0.5 K/uL   Basophils Relative 0 %   Basophils Absolute 0.0 0.0 - 0.1 K/uL   Immature Granulocytes 1 %   Abs Immature Granulocytes 0.04 0.00 - 0.07 K/uL  Lactic acid, plasma  Result Value Ref Range   Lactic Acid, Venous 2.4 (HH) 0.5  - 1.9 mmol/L  Comprehensive metabolic panel  Result Value Ref Range   Sodium 131 (L) 135 - 145 mmol/L   Potassium 4.5 3.5 - 5.1 mmol/L   Chloride 89 (L) 98 - 111 mmol/L   CO2 27 22 - 32 mmol/L   Glucose, Bld 471 (H) 70 - 99 mg/dL   BUN 30 (H) 6 - 20 mg/dL   Creatinine, Ser 7.821.51 (H) 0.61 - 1.24 mg/dL   Calcium 7.9 (L) 8.9 - 10.3 mg/dL   Total Protein 7.4 6.5 - 8.1 g/dL   Albumin 3.0 (L) 3.5 - 5.0 g/dL   AST 53 (H) 15 - 41 U/L   ALT 42 0 - 44 U/L   Alkaline Phosphatase 82 38 - 126 U/L   Total Bilirubin 0.8 0.3 - 1.2 mg/dL   GFR calc non Af Amer 51 (L) >60 mL/min   GFR calc Af Amer 59 (L) >60 mL/min   Anion gap 15 5 - 15  Lactate dehydrogenase  Result Value Ref Range   LDH 377 (H) 98 - 192 U/L  Procalcitonin  Result Value Ref Range   Procalcitonin 6.86 ng/mL  Triglycerides  Result Value Ref Range   Triglycerides 171 (H) <150 mg/dL  C-reactive protein  Result Value Ref Range   CRP 16.8 (H) <1.0 mg/dL  Ferritin  Result Value Ref Range   Ferritin 430 (H) 24 - 336 ng/mL  D-dimer, quantitative (not at [facility])  Result Value Ref Range   D-Dimer, Quant 0.95 (H) 0.00 - 0.50 ug/mL-FEU  Fibrinogen  Result Value Ref Range   Fibrinogen 768 (H) 210 - 475 mg/dL  Blood gas, venous  Result Value Ref Range   FIO2 NOT PROVIDED    pH, Ven 7.327 7.250 - 7.430   pCO2, Ven 47.7 44.0 - 60.0 mmHg   pO2, Ven <31.0 (LL) 32.0 - 45.0 mmHg   Bicarbonate 24.3 20.0 - 28.0 mmol/L   Acid-base deficit 2.0 0.0 - 2.0 mmol/L   O2 Saturation 26.4 %   Patient temperature 98.6   Urinalysis, Routine w reflex microscopic  Result Value Ref Range   Color, Urine YELLOW YELLOW   APPearance CLEAR CLEAR   Specific Gravity, Urine 1.027 1.005 - 1.030   pH 5.0 5.0 - 8.0   Glucose, UA >=500 (A) NEGATIVE mg/dL   Hgb urine dipstick MODERATE (A) NEGATIVE   Bilirubin Urine NEGATIVE NEGATIVE   Ketones, ur NEGATIVE NEGATIVE mg/dL   Protein, ur 100 (A) NEGATIVE mg/dL   Nitrite NEGATIVE NEGATIVE   Leukocytes,Ua  NEGATIVE NEGATIVE   RBC / HPF 0-5 0 - 5 RBC/hpf   WBC, UA 0-5 0 - 5 WBC/hpf   Bacteria, UA RARE (A) NONE SEEN   Mucus PRESENT   Magnesium  Result Value Ref Range   Magnesium 2.9 (H) 1.7 - 2.4 mg/dL  Phosphorus  Result Value Ref Range   Phosphorus 3.8 2.5 - 4.6 mg/dL  CBG monitoring, ED  Result Value Ref Range   Glucose-Capillary 459 (H) 70 - 99 mg/dL  CBG monitoring, ED  Result Value Ref Range   Glucose-Capillary 367 (H) 70 - 99 mg/dL   CT Head Wo Contrast  Result Date: 12/13/2018 CLINICAL DATA:  COVID-19 positive. Headaches. EXAM: CT HEAD WITHOUT CONTRAST TECHNIQUE: Contiguous axial images were obtained from the base of the skull through the vertex without intravenous contrast. COMPARISON:  None. FINDINGS: Brain: No evidence of acute infarction, hemorrhage, hydrocephalus, extra-axial collection or mass lesion/mass effect. Vascular: No hyperdense vessel. Skull: Normal. Negative for fracture or focal lesion. Sinuses/Orbits: No acute finding. Other: None. IMPRESSION: No focal acute intracranial abnormality identified. Electronically Signed   By: Abelardo Diesel M.D.   On: 12/13/2018 13:11   CT ABDOMEN PELVIS W CONTRAST  Result Date: 12/13/2018 CLINICAL DATA:  COVID-19 positive. Generalized abdomen pain. EXAM: CT ABDOMEN AND PELVIS WITH CONTRAST TECHNIQUE: Multidetector CT imaging of the abdomen and pelvis was performed using the standard protocol following bolus administration of intravenous contrast. CONTRAST:  169mL OMNIPAQUE IOHEXOL 300 MG/ML  SOLN COMPARISON:  February 16, 2008 FINDINGS: Lower chest: Mild patchy opacity is identified in bilateral lower lobes more prominently involving the right lower lobe. The heart size is normal. Hepatobiliary: There is diffuse low density of liver. No focal liver lesion is identified. The gallbladder is normal. The biliary tree is normal. Pancreas: Unremarkable. No pancreatic ductal dilatation or surrounding inflammatory changes. Spleen: Normal in size  without focal abnormality. Adrenals/Urinary Tract: Left adrenal nodule is unchanged compared prior exam. The right adrenal gland is normal. Nonobstructing stones are identified in both kidneys. No hydronephrosis is identified bilaterally. Bladder  is normal. Stomach/Bowel: Stomach is within normal limits. Appendix appears normal. No evidence of bowel wall thickening, distention, or inflammatory changes. Vascular/Lymphatic: Aortic atherosclerosis. No enlarged abdominal or pelvic lymph nodes. Reproductive: Prostate is unremarkable. Other: None. Musculoskeletal: Degenerative joint changes of the spine are noted. IMPRESSION: 1. Mild patchy consolidation identified in bilateral lower lobes, more prominently involving the right lower lobe suspicious for pneumonia. 2. No acute abnormality identified in the abdomen and pelvis. 3. Fatty infiltration of liver. 4. Nonobstructing stones in both kidneys. Electronically Signed   By: Sherian Rein M.D.   On: 12/13/2018 13:16   DG Chest Port 1 View  Result Date: 12/19/2018 CLINICAL DATA:  COVID-19. Shortness of breath. EXAM: PORTABLE CHEST 1 VIEW COMPARISON:  Radiograph 6 days ago 12/13/2018 FINDINGS: Progressive patchy opacities throughout both lungs from prior exam, basilar predominant. Unchanged heart size and mediastinal contours. No pleural fluid or pneumothorax. No acute osseous abnormalities are seen. IMPRESSION: Progressive patchy opacities throughout both lungs, consistent with multifocal pneumonia. Electronically Signed   By: Narda Rutherford M.D.   On: 12/19/2018 05:44   DG Chest Portable 1 View  Result Date: 12/13/2018 CLINICAL DATA:  Altered mental status. EXAM: PORTABLE CHEST 1 VIEW COMPARISON:  08/03/2010 FINDINGS: Normal sized heart. Clear lungs. Unremarkable bones. IMPRESSION: No acute abnormality. Electronically Signed   By: Beckie Salts M.D.   On: 12/13/2018 10:17    Clinical Course as of Dec 18 957  Thu Dec 19, 2018  6295 Lucas White evaluated after  nursing reported he was refusing lab draw and IVF. Lucas White tachypneic with inc work of breathing, can only speak in short phrases. He also seems to be anxious and is complaining of body aches. Will treat w tylenol and provide Lucas White with a meal. He is appropriate for admission.   [JR]    Clinical Course User Index [JR] Jacilyn Sanpedro, [place] N, PA-C    Procedures  MDM  Lucas White will be admitted to hospitalist service.       Ryker [place], [place] N, PA-C 12/19/18 1000    Mesner, Barbara Cower, MD 12/22/18 2841    Marily Memos, MD 12/24/18 [phone]

## 2018-12-19 NOTE — ED Notes (Signed)
Date and time results received: 12/19/18 7:02 AM  Test: Lactic Acid Critical Value: 2.4  Name of Provider Notified: Mesner, MD  Orders Received? Or Actions Taken?:

## 2018-12-20 DIAGNOSIS — J9601 Acute respiratory failure with hypoxia: Secondary | ICD-10-CM

## 2018-12-20 DIAGNOSIS — M545 Low back pain: Secondary | ICD-10-CM

## 2018-12-20 DIAGNOSIS — E860 Dehydration: Secondary | ICD-10-CM

## 2018-12-20 DIAGNOSIS — E1165 Type 2 diabetes mellitus with hyperglycemia: Secondary | ICD-10-CM

## 2018-12-20 DIAGNOSIS — J189 Pneumonia, unspecified organism: Secondary | ICD-10-CM

## 2018-12-20 DIAGNOSIS — N179 Acute kidney failure, unspecified: Secondary | ICD-10-CM

## 2018-12-20 DIAGNOSIS — Z79891 Long term (current) use of opiate analgesic: Secondary | ICD-10-CM

## 2018-12-20 DIAGNOSIS — M6282 Rhabdomyolysis: Secondary | ICD-10-CM

## 2018-12-20 DIAGNOSIS — R748 Abnormal levels of other serum enzymes: Secondary | ICD-10-CM

## 2018-12-20 DIAGNOSIS — G9341 Metabolic encephalopathy: Secondary | ICD-10-CM

## 2018-12-20 LAB — COMPREHENSIVE METABOLIC PANEL
ALT: 50 U/L — ABNORMAL HIGH (ref 0–44)
AST: 54 U/L — ABNORMAL HIGH (ref 15–41)
Albumin: 3.1 g/dL — ABNORMAL LOW (ref 3.5–5.0)
Alkaline Phosphatase: 86 U/L (ref 38–126)
Anion gap: 12 (ref 5–15)
BUN: 25 mg/dL — ABNORMAL HIGH (ref 6–20)
CO2: 28 mmol/L (ref 22–32)
Calcium: 8.7 mg/dL — ABNORMAL LOW (ref 8.9–10.3)
Chloride: 97 mmol/L — ABNORMAL LOW (ref 98–111)
Creatinine, Ser: 0.93 mg/dL (ref 0.61–1.24)
GFR calc Af Amer: >60 mL/min (ref >60)
GFR calc non Af Amer: >60 mL/min (ref >60)
Glucose, Bld: 225 mg/dL — ABNORMAL HIGH (ref 70–99)
Potassium: 3.9 mmol/L (ref 3.5–5.1)
Sodium: 137 mmol/L (ref 135–145)
Total Bilirubin: 0.4 mg/dL (ref 0.3–1.2)
Total Protein: 7.8 g/dL (ref 6.5–8.1)

## 2018-12-20 LAB — MAGNESIUM: Magnesium: 2.8 mg/dL — ABNORMAL HIGH (ref 1.7–2.4)

## 2018-12-20 LAB — CBC WITH DIFFERENTIAL/PLATELET
Abs Immature Granulocytes: 0.02 10*3/uL (ref 0.00–0.07)
Basophils Absolute: 0 10*3/uL (ref 0.0–0.1)
Basophils Relative: 0 %
Eosinophils Absolute: 0 10*3/uL (ref 0.0–0.5)
Eosinophils Relative: 0 %
HCT: 36 % — ABNORMAL LOW (ref 39.0–52.0)
Hemoglobin: 11.8 g/dL — ABNORMAL LOW (ref 13.0–17.0)
Immature Granulocytes: 0 %
Lymphocytes Relative: 15 %
Lymphs Abs: 0.8 10*3/uL (ref 0.7–4.0)
MCH: 28.4 pg (ref 26.0–34.0)
MCHC: 32.8 g/dL (ref 30.0–36.0)
MCV: 86.7 fL (ref 80.0–100.0)
Monocytes Absolute: 0.1 10*3/uL (ref 0.1–1.0)
Monocytes Relative: 2 %
Neutro Abs: 4.2 10*3/uL (ref 1.7–7.7)
Neutrophils Relative %: 83 %
Platelets: 429 10*3/uL — ABNORMAL HIGH (ref 150–400)
RBC: 4.15 MIL/uL — ABNORMAL LOW (ref 4.22–5.81)
RDW: 11.9 % (ref 11.5–15.5)
WBC: 5.1 10*3/uL (ref 4.0–10.5)
nRBC: 0 % (ref 0.0–0.2)

## 2018-12-20 LAB — GLUCOSE, CAPILLARY
Glucose-Capillary: 188 mg/dL — ABNORMAL HIGH (ref 70–99)
Glucose-Capillary: 235 mg/dL — ABNORMAL HIGH (ref 70–99)
Glucose-Capillary: 206 mg/dL — ABNORMAL HIGH (ref 70–99)
Glucose-Capillary: 191 mg/dL — ABNORMAL HIGH (ref 70–99)
Glucose-Capillary: 196 mg/dL — ABNORMAL HIGH (ref 70–99)

## 2018-12-20 LAB — D-DIMER, QUANTITATIVE: D-Dimer, Quant: 1.14 ug/mL-FEU — ABNORMAL HIGH (ref 0.00–0.50)

## 2018-12-20 LAB — T4, FREE: Free T4: 1.21 ng/dL — ABNORMAL HIGH (ref 0.61–1.12)

## 2018-12-20 LAB — VITAMIN B12: Vitamin B-12: 1627 pg/mL — ABNORMAL HIGH (ref 180–914)

## 2018-12-20 LAB — PHOSPHORUS: Phosphorus: 2.7 mg/dL (ref 2.5–4.6)

## 2018-12-20 LAB — CK: Total CK: 654 U/L — ABNORMAL HIGH (ref 49–397)

## 2018-12-20 LAB — AMMONIA: Ammonia: 16 umol/L (ref 9–35)

## 2018-12-20 LAB — FERRITIN: Ferritin: 444 ng/mL — ABNORMAL HIGH (ref 24–336)

## 2018-12-20 LAB — PROCALCITONIN: Procalcitonin: 6.07 ng/mL

## 2018-12-20 LAB — TSH: TSH: 0.051 u[IU]/mL — ABNORMAL LOW (ref 0.350–4.500)

## 2018-12-20 LAB — HIV ANTIBODY (ROUTINE TESTING W REFLEX): HIV Screen 4th Generation wRfx: NONREACTIVE

## 2018-12-20 LAB — C-REACTIVE PROTEIN: CRP: 12.9 mg/dL — ABNORMAL HIGH (ref <1.0)

## 2018-12-20 MED ORDER — POTASSIUM CHLORIDE IN NACL 20-0.9 MEQ/L-% IV SOLN
INTRAVENOUS | Status: DC
  Filled 2018-12-20 (×4): qty 1000

## 2018-12-20 MED ORDER — INSULIN ASPART 100 UNIT/ML ~~LOC~~ SOLN
0.0000 [IU] | Freq: Every day | SUBCUTANEOUS | Status: DC
  Administered 2018-12-21: 2 [IU] via SUBCUTANEOUS
  Administered 2018-12-22: 3 [IU] via SUBCUTANEOUS

## 2018-12-20 MED ORDER — INSULIN ASPART 100 UNIT/ML ~~LOC~~ SOLN: 4.0000 [IU] | Freq: Three times a day (TID) | SUBCUTANEOUS | Status: DC

## 2018-12-20 MED ORDER — LINAGLIPTIN 5 MG PO TABS
5.0000 mg | ORAL_TABLET | Freq: Every day | ORAL | Status: DC
  Administered 2018-12-20 – 2018-12-23 (×4): 5 mg via ORAL
  Filled 2018-12-20 (×4): qty 1

## 2018-12-20 MED ORDER — OXYCODONE HCL 5 MG PO TABS
15.0000 mg | ORAL_TABLET | ORAL | Status: DC | PRN
  Administered 2018-12-20 – 2018-12-23 (×16): 15 mg via ORAL
  Filled 2018-12-20 (×16): qty 3

## 2018-12-20 MED ORDER — INSULIN ASPART 100 UNIT/ML ~~LOC~~ SOLN
0.0000 [IU] | Freq: Three times a day (TID) | SUBCUTANEOUS | Status: DC
  Administered 2018-12-20: 7 [IU] via SUBCUTANEOUS
  Administered 2018-12-20 – 2018-12-21 (×2): 4 [IU] via SUBCUTANEOUS
  Administered 2018-12-21: 11 [IU] via SUBCUTANEOUS
  Administered 2018-12-21: 7 [IU] via SUBCUTANEOUS
  Administered 2018-12-22: 11 [IU] via SUBCUTANEOUS
  Administered 2018-12-22 (×2): 4 [IU] via SUBCUTANEOUS
  Administered 2018-12-23: 3 [IU] via SUBCUTANEOUS
  Administered 2018-12-23: 4 [IU] via SUBCUTANEOUS

## 2018-12-20 NOTE — TOC Progression Note (Signed)
Transition of Care [facility]) - Progression Note    Patient Details  Name: Lucas White MRN: [id number] Date of Birth: January 12, 1960  [facility] CM/SW Contact  Purcell Mouton, RN Phone Number: 12/20/2018, 4:59 PM  Clinical Narrative:    Pt is at home with spouse. Will continue to follow for discharge needs.        Expected Discharge Plan and Services                                                 Social Determinants of Health (SDOH) Interventions    Readmission Risk Interventions No flowsheet data found.

## 2018-12-20 NOTE — Progress Notes (Signed)
PROGRESS NOTE  Lucas White:[id number] DOB: 30-Jun-1960   PCP: Windy Fast, MD  Patient is from: Home.   DOA: 12/19/2018 LOS: 1  Brief Narrative / Interim history: 58 year old male with history of chronic back pain on high-dose opiates and DM-2 presenting with progressive dyspnea and weakness.  Recently tested positive for COVID-19 about 6 days ago.  Found to be hypoxic to 74% on room air when EMS arrived.  CBG 533.  Other vital signs within normal.  1 off note, patient presented to ED on 12/11 with pain.  Reportedly ran out of his oxycodone at that time.  He had mild temp, URI symptoms and myalgia.  Neuro exam was basically normal at that time.  Had hyperglycemia but no DKA.  Covid test was positive.  CXR and CT head were negative.  CT abdomen and pelvis revealed bibasilar mild patchy consolidation.  Ambulatory saturation was normal.  He was discharged home infection precaution and follow-up with PCP.  In ED, tachypneic to 27.  94% on room air.  VBG significant for PO2 42mmhg.  CBC without significant finding.  Lactic acid 2.4.  Sodium 131.  Glucose was 71.  Bicarb 27.  Creatinine 1.51.  AST 53.  Inflammatory markers, CK and procalcitonin elevated.  High-sensitivity troponin and BNP was normal.  UDS positive for opiates.  CXR with progressive patchy opacities throughout.  Subjective: Patient was confused and agitated overnight.  Agitation improved after he received IV Dilaudid and Ativan.  He is somewhat drowsy and confused this morning.  He is oriented to self and family name.  Reports hurting all over.  Unfortunately, not able to provide further detail.  Does not appear to be in distress.  Objective: Vitals:   12/19/18 1326 12/19/18 1328 12/19/18 2005 12/20/18 0416  BP:  100/61 108/69 130/76  Pulse:  86 75 74  Resp:  20 20 18   Temp: 98.1 F (36.7 C) 98.1 F (36.7 C) 97.8 F (36.6 C) 98.4 F (36.9 C)  TempSrc: Oral Oral Oral Oral  SpO2:  95% 96% 99%  Weight:      Height:         Intake/Output Summary (Last 24 hours) at 12/20/2018 1013 Last data filed at 12/20/2018 0300 Gross per 24 hour  Intake 767.38 ml  Output 4 ml  Net 763.38 ml   Filed Weights   12/19/18 1111  Weight: 89.3 kg    Examination:  GENERAL: No apparent distress.  Nontoxic. HEENT: MMM.  Vision and hearing grossly intact.  NECK: Supple.  No apparent JVD.  RESP:  No IWOB. Good air movement bilaterally. CVS:  RRR. Heart sounds normal.  ABD/GI/GU: Bowel sounds present. Soft. Non tender.  MSK/EXT:  No apparent deformity or edema.  SKIN: no apparent skin lesion or wound NEURO: Somewhat drowsy.  Oriented to self and family name.  Follows some command.  Moves all extremities.  Cranial nerves grossly intact. PSYCH: Drowsy and calm.  Procedures:  None  Assessment & Plan: Acute respiratory failure with hypoxia due to COVID-19 pneumonia and CAP:  Severe sepsis with endorgan damage due to the above Patient tested positive in ED on 12/11 and discharged home with return precautions.  Influenza PCR negative.  Returns with significant hypoxia and encephalopathy.  Inflammatory markers and procalcitonin elevated.  Also with lactic acidosis, AKI and encephalopathy.  CXR with multifocal pneumonia. Recent Labs    12/19/18 0654 12/20/18 0325  DDIMER 0.95* 1.14*  FERRITIN 430* 444*  LDH 377*  --   CRP  16.8* 12.9*  -Continue Solu-Medrol and remdesivir 12/17>> -Continue ceftriaxone and azithromycin 12/17>> -Supportive care with inhalers, mucolytic's, antitussive and incentive spirometry -Subcu Lovenox for VT prophylaxis -Trend inflammatory markers and procalcitonin. -Follow cultures  Acute toxic and metabolic encephalopathy with confusion and agitation due to the above cardiology and possibly withdrawal from opiate.  Per database review filled oxycodone 20 mg, #120 for 30 days on 11/27.  Per ED note on 12/11, he ran out of his and presented to ED with pain when he tested positive for Covid.  Has no  focal neuro deficit. -Treat infectious process as above. -Check vitamin B12, thiamine, TFTs and RPR -Resume home oxycodone at 15 mg 3 times daily -Frequent reorientation and delirium precautions. -If no improvement, will obtain MRI brain -Continue safety precaution for now.  Acute kidney injury: Likely prerenal in the setting of dehydration and ATN in the setting of COVID-19 infection.  Resolved. -Continue monitoring  Uncontrolled diabetes with hyperglycemia without DKA: On Lantus and Metformin at home. Recent Labs    12/19/18 2001 12/20/18 0316 12/20/18 0728  GLUCAP 181* 188* 235*  -Levemir 15 units twice daily, NovoLog 4 units AC, SSI-resistant and Tradjenta -Hold off statin in the setting of rhabdo.  Elevated liver enzymes: Likely due to Covid, rhabdo and remdesivir. -Continue monitoring  Chronic back pain on chronic opiates -Resume home opiates.  Dehydration:  -Continue gentle IV fluid  Mild rhabdo possibly due to COVID-19 infection -Gentle IV fluid.                   DVT prophylaxis: Subcu Lovenox Code Status: Full code Family Communication: Attempted to call patient's wife for update but no answer.  Did not leave voicemail. Disposition Plan: Remains inpatient Consultants: None   Microbiology summarized: 12/11-COVID-19 positive. 12/17-respiratory culture Gram stain with multiple species. 12/17-blood cultures pending.  Sch Meds:  Scheduled Meds: . vitamin C  500 mg Oral Daily  . enoxaparin (LOVENOX) injection  40 mg Subcutaneous Q24H  . insulin aspart  0-20 Units Subcutaneous Q4H  . insulin detemir  0.15 Units/kg Subcutaneous BID  . Ipratropium-Albuterol  2 puff Inhalation TID  . methylPREDNISolone (SOLU-MEDROL) injection  0.5 mg/kg Intravenous Q12H  . Vitamin D (Ergocalciferol)  50,000 Units Oral Once per day on Thu  . zinc sulfate  220 mg Oral Daily   Continuous Infusions: . azithromycin 500 mg (12/19/18 1544)  . cefTRIAXone (ROCEPHIN)  IV 2  g (12/19/18 1020)  . remdesivir 100 mg in NS 100 mL    . sodium chloride     PRN Meds:.acetaminophen **OR** acetaminophen, chlorpheniramine-HYDROcodone, guaiFENesin-dextromethorphan, HYDROcodone-acetaminophen, Ipratropium-Albuterol  Antimicrobials: Anti-infectives (From admission, onward)   Start     Dose/Rate Route Frequency Ordered Stop   12/20/18 1000  remdesivir 100 mg in sodium chloride 0.9 % 100 mL IVPB     100 mg 200 mL/hr over 30 Minutes Intravenous Daily 12/19/18 0931 12/24/18 0959   12/19/18 1000  remdesivir 200 mg in sodium chloride 0.9% 250 mL IVPB     200 mg 580 mL/hr over 30 Minutes Intravenous Once 12/19/18 0931 12/19/18 1148   12/19/18 1000  cefTRIAXone (ROCEPHIN) 2 g in sodium chloride 0.9 % 100 mL IVPB     2 g 200 mL/hr over 30 Minutes Intravenous Every 24 hours 12/19/18 0933 12/24/18 0959   12/19/18 1000  azithromycin (ZITHROMAX) 500 mg in sodium chloride 0.9 % 250 mL IVPB     500 mg 250 mL/hr over 60 Minutes Intravenous Every 24 hours 12/19/18 0933 12/24/18  1610       I have personally reviewed the following labs and images: CBC: Recent Labs  Lab 12/19/18 0519 12/20/18 0325  WBC 7.2 5.1  NEUTROABS 5.7 4.2  HGB 11.3* 11.8*  HCT 33.2* 36.0*  MCV 86.5 86.7  PLT 289 429*   BMP &GFR Recent Labs  Lab 12/19/18 0654 12/20/18 0325  NA 131* 137  K 4.5 3.9  CL 89* 97*  CO2 27 28  GLUCOSE 471* 225*  BUN 30* 25*  CREATININE 1.51* 0.93  CALCIUM 7.9* 8.7*  MG 2.9* 2.8*  PHOS 3.8 2.7   Estimated Creatinine Clearance: 94.5 mL/min (by C-G formula based on SCr of 0.93 mg/dL). Liver & Pancreas: Recent Labs  Lab 12/19/18 0654 12/20/18 0325  AST 53* 54*  ALT 42 50*  ALKPHOS 82 86  BILITOT 0.8 0.4  PROT 7.4 7.8  ALBUMIN 3.0* 3.1*   No results for input(s): LIPASE, AMYLASE in the last 168 hours. No results for input(s): AMMONIA in the last 168 hours. Diabetic: No results for input(s): HGBA1C in the last 72 hours. Recent Labs  Lab 12/19/18 1131  12/19/18 1629 12/19/18 2001 12/20/18 0316 12/20/18 0728  GLUCAP 305* 195* 181* 188* 235*   Cardiac Enzymes: No results for input(s): CKTOTAL, CKMB, CKMBINDEX, TROPONINI in the last 168 hours. No results for input(s): PROBNP in the last 8760 hours. Coagulation Profile: No results for input(s): INR, PROTIME in the last 168 hours. Thyroid Function Tests: No results for input(s): TSH, T4TOTAL, FREET4, T3FREE, THYROIDAB in the last 72 hours. Lipid Profile: Recent Labs    12/19/18 0654  TRIG 171*   Anemia Panel: Recent Labs    12/19/18 0654 12/20/18 0325  FERRITIN 430* 444*   Urine analysis:    Component Value Date/Time   COLORURINE YELLOW 12/19/2018 0721   APPEARANCEUR CLEAR 12/19/2018 0721   LABSPEC 1.027 12/19/2018 0721   PHURINE 5.0 12/19/2018 0721   GLUCOSEU >=500 (A) 12/19/2018 0721   HGBUR MODERATE (A) 12/19/2018 0721   BILIRUBINUR NEGATIVE 12/19/2018 0721   KETONESUR NEGATIVE 12/19/2018 0721   PROTEINUR 100 (A) 12/19/2018 0721   NITRITE NEGATIVE 12/19/2018 0721   LEUKOCYTESUR NEGATIVE 12/19/2018 0721   Sepsis Labs: Invalid input(s): PROCALCITONIN, LACTICIDVEN  Microbiology: Recent Results (from the past 240 hour(s))  Blood culture (routine x 2)     Status: None   Collection Time: 12/13/18 10:04 AM   Specimen: BLOOD  Result Value Ref Range Status   Specimen Description   Final    BLOOD RIGHT ANTECUBITAL Performed at [facility], [address], [place], [place] [address]    Special Requests   Final    BOTTLES DRAWN AEROBIC AND ANAEROBIC Blood Culture adequate volume Performed at [facility], [address], [place], [place] [address]    Culture   Final    NO GROWTH 5 DAYS Performed at [facility], [address] [place], [place] [address]    Report Status 12/18/2018 FINAL  Final  Blood culture (routine x 2)     Status: None   Collection Time: 12/13/18 10:33 AM   Specimen: BLOOD  Result Value Ref Range  Status   Specimen Description   Final    BLOOD RIGHT ARM Performed at [facility], [address] [place], [place] [address]    Special Requests   Final    BOTTLES DRAWN AEROBIC AND ANAEROBIC Blood Culture results may not be optimal due to an excessive volume of blood  received in culture bottles Performed at [facility], [address] [place], [place] [address]    Culture   Final    NO GROWTH 5 DAYS Performed at [facility], [address] [place], [place] [address]    Report Status 12/18/2018 FINAL  Final  Culture, sputum-assessment     Status: None   Collection Time: 12/19/18 10:53 AM   Specimen: Sputum  Result Value Ref Range Status   Specimen Description SPUTUM  Final   Special Requests Immunocompromised  Final   Sputum evaluation   Final    THIS SPECIMEN IS ACCEPTABLE FOR SPUTUM CULTURE Performed at [facility], [address] [place], [place] [address]    Report Status 12/19/2018 FINAL  Final  Culture, respiratory     Status: None (Preliminary result)   Collection Time: 12/19/18 10:53 AM   Specimen: SPU  Result Value Ref Range Status   Specimen Description   Final    SPUTUM Performed at [facility], [address] [place], [place] [address]    Special Requests   Final    Immunocompromised Reflexed from [id number] Performed at [facility], [address] [place], [place] [address]    Gram Stain   Final    RARE WBC PRESENT, PREDOMINANTLY PMN MODERATE GRAM NEGATIVE RODS FEW GRAM POSITIVE COCCI IN PAIRS IN CHAINS RARE GRAM POSITIVE RODS RARE YEAST Performed at [facility], [address] [place], [place] [address]    Culture PENDING  Incomplete   Report Status PENDING  Incomplete    Radiology Studies: No results found.  45 minutes with more than 50% spent in reviewing records, counseling patient/family and coordinating care.   Martisha Toulouse T.  Peachie Barkalow [facility]  If 7PM-7AM, please contact night-coverage [email or web address] Password [email or web address] 12/20/2018, 10:13 AM

## 2018-12-20 NOTE — Progress Notes (Signed)
Received call from pt sister Yoltzin Barg [phone], concerned about pt and has knowledge of patient current home status. SRP, RN

## 2018-12-20 NOTE — Plan of Care (Signed)
  Problem: Education: Goal: Knowledge of General Education information will improve Description: Including pain rating scale, medication(s)/side effects and non-pharmacologic comfort measures Outcome: Completed/Met

## 2018-12-20 NOTE — Progress Notes (Signed)
Called spouse to update on current status and review plan of care. SRP, RN

## 2018-12-21 LAB — CBC WITH DIFFERENTIAL/PLATELET
Abs Immature Granulocytes: 0.03 10*3/uL (ref 0.00–0.07)
Basophils Absolute: 0 10*3/uL (ref 0.0–0.1)
Basophils Relative: 0 %
Eosinophils Absolute: 0 10*3/uL (ref 0.0–0.5)
Eosinophils Relative: 0 %
HCT: 33.8 % — ABNORMAL LOW (ref 39.0–52.0)
Hemoglobin: 11 g/dL — ABNORMAL LOW (ref 13.0–17.0)
Immature Granulocytes: 1 %
Lymphocytes Relative: 12 %
Lymphs Abs: 0.7 10*3/uL (ref 0.7–4.0)
MCH: 28.1 pg (ref 26.0–34.0)
MCHC: 32.5 g/dL (ref 30.0–36.0)
MCV: 86.4 fL (ref 80.0–100.0)
Monocytes Absolute: 0.2 10*3/uL (ref 0.1–1.0)
Monocytes Relative: 3 %
Neutro Abs: 5 10*3/uL (ref 1.7–7.7)
Neutrophils Relative %: 84 %
Platelets: 497 10*3/uL — ABNORMAL HIGH (ref 150–400)
RBC: 3.91 MIL/uL — ABNORMAL LOW (ref 4.22–5.81)
RDW: 11.9 % (ref 11.5–15.5)
WBC: 5.9 10*3/uL (ref 4.0–10.5)
nRBC: 0 % (ref 0.0–0.2)

## 2018-12-21 LAB — C-REACTIVE PROTEIN: CRP: 4.9 mg/dL — ABNORMAL HIGH (ref <1.0)

## 2018-12-21 LAB — RPR
RPR Ser Ql: REACTIVE — AB
RPR Titer: 1:1 {titer}

## 2018-12-21 LAB — COMPREHENSIVE METABOLIC PANEL
ALT: 39 U/L (ref 0–44)
AST: 32 U/L (ref 15–41)
Albumin: 2.8 g/dL — ABNORMAL LOW (ref 3.5–5.0)
Alkaline Phosphatase: 71 U/L (ref 38–126)
Anion gap: 10 (ref 5–15)
BUN: 21 mg/dL — ABNORMAL HIGH (ref 6–20)
CO2: 28 mmol/L (ref 22–32)
Calcium: 8.5 mg/dL — ABNORMAL LOW (ref 8.9–10.3)
Chloride: 101 mmol/L (ref 98–111)
Creatinine, Ser: 0.82 mg/dL (ref 0.61–1.24)
GFR calc Af Amer: >60 mL/min (ref >60)
GFR calc non Af Amer: >60 mL/min (ref >60)
Glucose, Bld: 196 mg/dL — ABNORMAL HIGH (ref 70–99)
Potassium: 3.7 mmol/L (ref 3.5–5.1)
Sodium: 139 mmol/L (ref 135–145)
Total Bilirubin: 0.8 mg/dL (ref 0.3–1.2)
Total Protein: 6.7 g/dL (ref 6.5–8.1)

## 2018-12-21 LAB — FERRITIN: Ferritin: 347 ng/mL — ABNORMAL HIGH (ref 24–336)

## 2018-12-21 LAB — GLUCOSE, CAPILLARY
Glucose-Capillary: 184 mg/dL — ABNORMAL HIGH (ref 70–99)
Glucose-Capillary: 251 mg/dL — ABNORMAL HIGH (ref 70–99)
Glucose-Capillary: 221 mg/dL — ABNORMAL HIGH (ref 70–99)

## 2018-12-21 LAB — MAGNESIUM: Magnesium: 2.4 mg/dL (ref 1.7–2.4)

## 2018-12-21 LAB — CK: Total CK: 299 U/L (ref 49–397)

## 2018-12-21 LAB — PHOSPHORUS: Phosphorus: 3 mg/dL (ref 2.5–4.6)

## 2018-12-21 LAB — PROCALCITONIN: Procalcitonin: 5.6 ng/mL

## 2018-12-21 LAB — D-DIMER, QUANTITATIVE: D-Dimer, Quant: 1.16 ug/mL-FEU — ABNORMAL HIGH (ref 0.00–0.50)

## 2018-12-21 MED ORDER — THIAMINE HCL 100 MG PO TABS
250.0000 mg | ORAL_TABLET | Freq: Every day | ORAL | Status: DC
  Administered 2018-12-21 – 2018-12-23 (×3): 250 mg via ORAL
  Filled 2018-12-21 (×3): qty 3

## 2018-12-21 MED ORDER — INSULIN ASPART 100 UNIT/ML ~~LOC~~ SOLN
6.0000 [IU] | Freq: Three times a day (TID) | SUBCUTANEOUS | Status: DC
  Administered 2018-12-22 – 2018-12-23 (×2): 6 [IU] via SUBCUTANEOUS

## 2018-12-21 MED ORDER — ESCITALOPRAM OXALATE 20 MG PO TABS
20.0000 mg | ORAL_TABLET | Freq: Every day | ORAL | Status: DC
  Administered 2018-12-21 – 2018-12-23 (×3): 20 mg via ORAL
  Filled 2018-12-21 (×3): qty 1

## 2018-12-21 MED ORDER — GABAPENTIN 300 MG PO CAPS
300.0000 mg | ORAL_CAPSULE | Freq: Three times a day (TID) | ORAL | Status: DC
  Administered 2018-12-21 – 2018-12-23 (×6): 300 mg via ORAL
  Filled 2018-12-21 (×6): qty 1

## 2018-12-21 NOTE — Progress Notes (Signed)
PT /OT Note  Patient Details Name: Lucas White MRN: [id number] DOB: 1960/01/21   Cancelled Treatment:     spoke with patients sitter and nurse. Pt is independent walking around the room. Currently in bathroom showering. No PT and OT needs at this time    Laurette Schimke, PT, MPT Acute Rehabilitation Services Office: [phone] 12/21/2018  12/21/2018, 3:57 PM

## 2018-12-21 NOTE — Progress Notes (Signed)
PROGRESS NOTE  Lucas White DGU:[id number] DOB: 1960/03/01   PCP: [name], MD  Patient is from: Home.  Reportedly has had decline in his mental status for quite some times.  Per wife, requires frequent reorientation which time/date.  Sometimes calls her mom.  Per wife, does not drink, smoke or or use recreational drug.  DOA: 12/19/2018 LOS: 2  Brief Narrative / Interim history: 59 year old male with history of chronic back pain on high-dose opiates and DM-2 presenting with progressive dyspnea and weakness.  Recently tested positive for COVID-19 about 6 days ago.  Found to be hypoxic to 74% on room air when EMS arrived.  CBG 533.  Other vital signs within normal.  Off note, patient presented to ED on 12/11 with pain.  Reportedly ran out of his oxycodone at that time.  He had mild temp, URI symptoms and myalgia.  Neuro exam was basically normal at that time.  Had hyperglycemia but no DKA.  Covid test was positive.  CXR and CT head were negative.  CT abdomen and pelvis revealed bibasilar mild patchy consolidation.  Ambulatory saturation was normal.  He was discharged home infection precaution and follow-up with PCP.  In ED, tachypneic to 27.  94% on room air.  VBG significant for PO2 .  CBC without significant finding.  Lactic acid 2.4.  Sodium 131.  Glucose was 71.  Bicarb 27.  Creatinine 1.51.  AST 53.  Inflammatory markers, CK and procalcitonin elevated.  High-sensitivity troponin and BNP was normal.  UDS positive for opiates.  CXR with progressive patchy opacities throughout.  Subjective: No major events overnight of this morning.  No complaints.   He denies pain anywhere.Mental status seems to have improved but still slow to respond to questions.  Oriented to self, place and family name but not time.  Objective: Vitals:   12/20/18 0416 12/20/18 1239 12/20/18 2145 12/21/18 0557  BP: 130/76 131/86 138/87 128/79  Pulse: 74 76 65 (!) 57  Resp: Temp: 98.4 F (36.9  C) 98.1 F (36.7 C) 98.3 F (36.8 C) 98.1 F (36.7 C)  TempSrc: Oral Oral Oral Oral  SpO2: 99% 98% 99% 97%  Weight:      Height:        Intake/Output Summary (Last 24 hours) at 12/21/2018 1145 Last data filed at 12/21/2018 1134 Gross per 24 hour  Intake 1485.22 ml  Output 0 ml  Net 1485.22 ml   Filed Weights   12/19/18 1111  Weight: 89.3 kg    Examination:  GENERAL: No apparent distress.  Nontoxic. HEENT: MMM.  Vision and hearing grossly intact.  NECK: Supple.  No apparent JVD.  RESP:  No IWOB. Good air movement bilaterally. CVS:  RRR. Heart sounds normal.  ABD/GI/GU: Bowel sounds present. Soft. Non tender.  MSK/EXT:  Moves extremities. No apparent deformity or edema.  SKIN: no apparent skin lesion or wound NEURO: Awake, alert and oriented self, family and place.  No apparent focal neuro deficit. PSYCH: Calm.  Slow and hesitant  Procedures:  None  Assessment & Plan: Acute respiratory failure with hypoxia due to COVID-19 pneumonia and CAP:  Severe sepsis with endorgan damage due to the above Patient tested positive in ED on 12/11 and discharged home with return precautions.  Influenza PCR negative.  Returns with significant hypoxia and encephalopathy.  Inflammatory markers and procalcitonin elevated.  Also with lactic acidosis, AKI and encephalopathy.  CXR with multifocal pneumonia.  Now on room air.  Blood cultures  negative.  Inflammatory markers and pro Cal improved.  Sputum Gram stain with multiple species.  Recent Labs    12/19/18 0654 12/20/18 0325 12/21/18 0233  DDIMER 0.95* 1.14* 1.16*  FERRITIN 430* 444* 347*  LDH 377*  --   --   CRP 16.8* 12.9* 4.9*  -Continue Solu-Medrol and remdesivir 12/17>> -Continue ceftriaxone and azithromycin 12/17>> -Supportive care with inhalers, mucolytic's, antitussive and incentive spirometry -Subcu Lovenox for VT prophylaxis -Trend inflammatory markers and procalcitonin. -Follow respiratory  Acute toxic and metabolic  encephalopathy with confusion and agitation due to the above cardiology and possibly withdrawal from opiate.  Per database review filled oxycodone 20 mg, #120 for 30 days on 11/27.  Per ED note on 12/11, he ran out of his and presented to ED with pain when he tested positive for Covid.  Has no focal neuro deficit.  Ammonia and vitamin B12 normal.  TSH low.  Free T4 mildly elevated.  Overall, mental status improved.  No focal neuro deficit. -Treat infectious process as above. -Follow thiamine and RPR.  Meanwhile, start high-dose thiamine -Resumed home oxycodone at 15 mg 3 times daily -Frequent reorientation and delirium precautions. -If no improvement, will obtain MRI brain -Continue safety precaution for now. -PT/OT eval.  Acute kidney injury: Likely prerenal in the setting of dehydration and ATN in the setting of COVID-19 infection.  Resolved. -Continue monitoring  Uncontrolled diabetes with hyperglycemia without DKA: On Lantus and Metformin at home. Recent Labs    12/20/18 1625 12/20/18 2130 12/21/18 0741  GLUCAP 191* 196* 184*  -Levemir 15 units twice daily, NovoLog 6 units AC, SSI-resistant and Tradjenta -Hold off statin in the setting of rhabdo.  Elevated liver enzymes: Likely due to Covid, rhabdo and remdesivir.  Resolved.  Chronic back pain on chronic opiates -Resume home opiates.  Dehydration: Resolved with hydration.  Mild rhabdo possibly due to COVID-19 infection: CK normalized. -Discontinue IV fluid.                   DVT prophylaxis: Subcu Lovenox Code Status: Full code Family Communication: Updated patient's wife over the phone. Disposition Plan: Remains inpatient Consultants: None   Microbiology summarized: 12/11-COVID-19 positive. 12/17-respiratory culture Gram stain with multiple species. 12/17-blood cultures negative so far  Sch Meds:  Scheduled Meds: . vitamin C  500 mg Oral Daily  . enoxaparin (LOVENOX) injection  40 mg Subcutaneous Q24H    . insulin aspart  0-20 Units Subcutaneous TID WC  . insulin aspart  0-5 Units Subcutaneous QHS  . insulin aspart  4 Units Subcutaneous TID WC  . insulin detemir  0.15 Units/kg Subcutaneous BID  . Ipratropium-Albuterol  2 puff Inhalation TID  . linagliptin  5 mg Oral Daily  . methylPREDNISolone (SOLU-MEDROL) injection  0.5 mg/kg Intravenous Q12H  . Vitamin D (Ergocalciferol)  50,000 Units Oral Once per day on Thu  . zinc sulfate  220 mg Oral Daily   Continuous Infusions: . 0.9 % NaCl with KCl 20 mEq / L 60 mL/hr at 12/21/18 0903  . azithromycin 500 mg (12/20/18 1014)  . cefTRIAXone (ROCEPHIN)  IV 2 g (12/21/18 0905)  . remdesivir 100 mg in NS 100 mL 100 mg (12/21/18 1058)  . sodium chloride     PRN Meds:.acetaminophen **OR** acetaminophen, chlorpheniramine-HYDROcodone, guaiFENesin-dextromethorphan, Ipratropium-Albuterol, oxyCODONE  Antimicrobials: Anti-infectives (From admission, onward)   Start     Dose/Rate Route Frequency Ordered Stop   12/20/18 1000  remdesivir 100 mg in sodium chloride 0.9 % 100 mL IVPB  100 mg 200 mL/hr over 30 Minutes Intravenous Daily 12/19/18 0931 12/24/18 0959   12/19/18 1000  remdesivir 200 mg in sodium chloride 0.9% 250 mL IVPB     200 mg 580 mL/hr over 30 Minutes Intravenous Once 12/19/18 0931 12/19/18 1148   12/19/18 1000  cefTRIAXone (ROCEPHIN) 2 g in sodium chloride 0.9 % 100 mL IVPB     2 g 200 mL/hr over 30 Minutes Intravenous Every 24 hours 12/19/18 0933 12/24/18 0959   12/19/18 1000  azithromycin (ZITHROMAX) 500 mg in sodium chloride 0.9 % 250 mL IVPB     500 mg 250 mL/hr over 60 Minutes Intravenous Every 24 hours 12/19/18 0933 12/24/18 0959       I have personally reviewed the following labs and images: CBC: Recent Labs  Lab 12/19/18 0519 12/20/18 0325 12/21/18 0233  WBC 7.2 5.1 5.9  NEUTROABS 5.7 4.2 5.0  HGB 11.3* 11.8* 11.0*  HCT 33.2* 36.0* 33.8*  MCV 86.5 86.7 86.4  PLT 289 429* 497*   BMP &GFR Recent Labs  Lab  12/19/18 0654 12/20/18 0325 12/21/18 0233  NA 131* 137 139  K 4.5 3.9 3.7  CL 89* 97* 101  CO2 27 28 28   GLUCOSE 471* 225* 196*  BUN 30* 25* 21*  CREATININE 1.51* 0.93 0.82  CALCIUM 7.9* 8.7* 8.5*  MG 2.9* 2.8* 2.4  PHOS 3.8 2.7 3.0   Estimated Creatinine Clearance: 107.1 mL/min (by C-G formula based on SCr of 0.82 mg/dL). Liver & Pancreas: Recent Labs  Lab 12/19/18 0654 12/20/18 0325 12/21/18 0233  AST 53* 54* 32  ALT 42 50* 39  ALKPHOS 82 86 71  BILITOT 0.8 0.4 0.8  PROT 7.4 7.8 6.7  ALBUMIN 3.0* 3.1* 2.8*   No results for input(s): LIPASE, AMYLASE in the last 168 hours. Recent Labs  Lab 12/20/18 0933  AMMONIA 16   Diabetic: No results for input(s): HGBA1C in the last 72 hours. Recent Labs  Lab 12/20/18 0728 12/20/18 1133 12/20/18 1625 12/20/18 2130 12/21/18 0741  GLUCAP 235* 206* 191* 196* 184*   Cardiac Enzymes: Recent Labs  Lab 12/20/18 0933 12/21/18 0233  CKTOTAL 654* 299   No results for input(s): PROBNP in the last 8760 hours. Coagulation Profile: No results for input(s): INR, PROTIME in the last 168 hours. Thyroid Function Tests: Recent Labs    12/20/18 0933 12/20/18 0939  TSH 0.051*  --   FREET4  --  1.21*   Lipid Profile: Recent Labs    12/19/18 0654  TRIG 171*   Anemia Panel: Recent Labs    12/20/18 0325 12/20/18 0933 12/21/18 0233  VITAMINB12  --  1,627*  --   FERRITIN 444*  --  347*   Urine analysis:    Component Value Date/Time   COLORURINE YELLOW 12/19/2018 0721   APPEARANCEUR CLEAR 12/19/2018 0721   LABSPEC 1.027 12/19/2018 0721   PHURINE 5.0 12/19/2018 0721   GLUCOSEU >=500 (A) 12/19/2018 0721   HGBUR MODERATE (A) 12/19/2018 0721   BILIRUBINUR NEGATIVE 12/19/2018 0721   KETONESUR NEGATIVE 12/19/2018 0721   PROTEINUR 100 (A) 12/19/2018 0721   NITRITE NEGATIVE 12/19/2018 0721   LEUKOCYTESUR NEGATIVE 12/19/2018 0721   Sepsis Labs: Invalid input(s): PROCALCITONIN, LACTICIDVEN  Microbiology: Recent Results  (from the past 240 hour(s))  Blood culture (routine x 2)     Status: None   Collection Time: 12/13/18 10:04 AM   Specimen: BLOOD  Result Value Ref Range Status   Specimen Description   Final    BLOOD  RIGHT ANTECUBITAL Performed at [facility], [address], [place], [place] [address]    Special Requests   Final    BOTTLES DRAWN AEROBIC AND ANAEROBIC Blood Culture adequate volume Performed at [facility], [address], [place], [place] [address]    Culture   Final    NO GROWTH 5 DAYS Performed at [facility], [address], [place], [place] [address]    Report Status 12/18/2018 FINAL  Final  Blood culture (routine x 2)     Status: None   Collection Time: 12/13/18 10:33 AM   Specimen: BLOOD  Result Value Ref Range Status   Specimen Description   Final    BLOOD RIGHT ARM Performed at [facility], [address], [place], [place] [address]    Special Requests   Final    BOTTLES DRAWN AEROBIC AND ANAEROBIC Blood Culture results may not be optimal due to an excessive volume of blood received in culture bottles Performed at [facility], [address] [place], [place] [address]    Culture   Final    NO GROWTH 5 DAYS Performed at [facility], [address] [place], [place] [address]    Report Status 12/18/2018 FINAL  Final  Blood Culture (routine x 2)     Status: None (Preliminary result)   Collection Time: 12/19/18  5:43 AM   Specimen: BLOOD  Result Value Ref Range Status   Specimen Description   Final    BLOOD SITE NOT SPECIFIED Performed at [facility], [address], [place], [place] [address]    Special Requests   Final    BOTTLES DRAWN AEROBIC AND ANAEROBIC Blood Culture adequate volume Performed at [facility], [address] [place], [place] [address]    Culture   Final    NO GROWTH 1 DAY Performed at [facility],  [address], [place], [place] [address]    Report Status PENDING  Incomplete  Blood Culture (routine x 2)     Status: None (Preliminary result)   Collection Time: 12/19/18  5:55 AM   Specimen: BLOOD  Result Value Ref Range Status   Specimen Description   Final    BLOOD RIGHT ANTECUBITAL Performed at [facility], [address] [place], [place] [address]    Special Requests   Final    BOTTLES DRAWN AEROBIC AND ANAEROBIC Blood Culture adequate volume Performed at [facility], [address], [place], [place] [address]    Culture   Final    NO GROWTH 1 DAY Performed at [facility], [address] [place], [place] [address]    Report Status PENDING  Incomplete  Culture, sputum-assessment     Status: None   Collection Time: 12/19/18 10:53 AM   Specimen: Sputum  Result Value Ref Range Status   Specimen Description SPUTUM  Final   Special Requests Immunocompromised  Final   Sputum evaluation   Final    THIS SPECIMEN IS ACCEPTABLE FOR SPUTUM CULTURE Performed at [facility], [address], [place], [place] [address]    Report Status 12/19/2018 FINAL  Final  Culture, respiratory     Status: None (Preliminary result)   Collection Time: 12/19/18 10:53 AM   Specimen: SPU  Result Value Ref Range Status   Specimen Description   Final    SPUTUM Performed at [facility],  [place] [address], [place], [place] [address]    Special Requests   Final    Immunocompromised Reflexed from [id number] Performed at [facility], [place] [address], [place], [place] [address]    Gram Stain   Final    RARE WBC PRESENT, PREDOMINANTLY PMN MODERATE GRAM NEGATIVE RODS FEW GRAM POSITIVE COCCI IN PAIRS IN CHAINS RARE GRAM POSITIVE RODS RARE YEAST Performed at [facility], [place] [address] [place], [place] [address]    Culture PENDING  Incomplete   Report Status PENDING  Incomplete    Radiology  Studies: No results found.    Deacon Gadbois T. [place]  If 7PM-7AM, please contact night-coverage [email or web address] Password [email or web address] 12/21/2018, 11:45 AM

## 2018-12-22 DIAGNOSIS — U071 COVID-19: Secondary | ICD-10-CM

## 2018-12-22 DIAGNOSIS — G8929 Other chronic pain: Secondary | ICD-10-CM

## 2018-12-22 DIAGNOSIS — J1289 Other viral pneumonia: Secondary | ICD-10-CM

## 2018-12-22 DIAGNOSIS — M549 Dorsalgia, unspecified: Secondary | ICD-10-CM

## 2018-12-22 DIAGNOSIS — R410 Disorientation, unspecified: Secondary | ICD-10-CM

## 2018-12-22 LAB — CBC WITH DIFFERENTIAL/PLATELET
Abs Immature Granulocytes: 0.05 10*3/uL (ref 0.00–0.07)
Basophils Absolute: 0 10*3/uL (ref 0.0–0.1)
Basophils Relative: 0 %
Eosinophils Absolute: 0 10*3/uL (ref 0.0–0.5)
Eosinophils Relative: 0 %
HCT: 34.8 % — ABNORMAL LOW (ref 39.0–52.0)
Hemoglobin: 11.4 g/dL — ABNORMAL LOW (ref 13.0–17.0)
Immature Granulocytes: 1 %
Lymphocytes Relative: 11 %
Lymphs Abs: 0.7 10*3/uL (ref 0.7–4.0)
MCH: 28.5 pg (ref 26.0–34.0)
MCHC: 32.8 g/dL (ref 30.0–36.0)
MCV: 87 fL (ref 80.0–100.0)
Monocytes Absolute: 0.3 10*3/uL (ref 0.1–1.0)
Monocytes Relative: 4 %
Neutro Abs: 5.6 10*3/uL (ref 1.7–7.7)
Neutrophils Relative %: 84 %
Platelets: 541 10*3/uL — ABNORMAL HIGH (ref 150–400)
RBC: 4 MIL/uL — ABNORMAL LOW (ref 4.22–5.81)
RDW: 11.8 % (ref 11.5–15.5)
WBC: 6.7 10*3/uL (ref 4.0–10.5)
nRBC: 0 % (ref 0.0–0.2)

## 2018-12-22 LAB — CULTURE, RESPIRATORY W GRAM STAIN

## 2018-12-22 LAB — MAGNESIUM: Magnesium: 2.2 mg/dL (ref 1.7–2.4)

## 2018-12-22 LAB — COMPREHENSIVE METABOLIC PANEL
ALT: 37 U/L (ref 0–44)
AST: 24 U/L (ref 15–41)
Albumin: 2.8 g/dL — ABNORMAL LOW (ref 3.5–5.0)
Alkaline Phosphatase: 74 U/L (ref 38–126)
Anion gap: 11 (ref 5–15)
BUN: 17 mg/dL (ref 6–20)
CO2: 27 mmol/L (ref 22–32)
Calcium: 8.7 mg/dL — ABNORMAL LOW (ref 8.9–10.3)
Chloride: 102 mmol/L (ref 98–111)
Creatinine, Ser: 0.76 mg/dL (ref 0.61–1.24)
GFR calc Af Amer: >60 mL/min (ref >60)
GFR calc non Af Amer: >60 mL/min (ref >60)
Glucose, Bld: 157 mg/dL — ABNORMAL HIGH (ref 70–99)
Potassium: 3.8 mmol/L (ref 3.5–5.1)
Sodium: 140 mmol/L (ref 135–145)
Total Bilirubin: 0.6 mg/dL (ref 0.3–1.2)
Total Protein: 6.7 g/dL (ref 6.5–8.1)

## 2018-12-22 LAB — FERRITIN: Ferritin: 262 ng/mL (ref 24–336)

## 2018-12-22 LAB — PHOSPHORUS: Phosphorus: 3.6 mg/dL (ref 2.5–4.6)

## 2018-12-22 LAB — GLUCOSE, CAPILLARY
Glucose-Capillary: 155 mg/dL — ABNORMAL HIGH (ref 70–99)
Glucose-Capillary: 189 mg/dL — ABNORMAL HIGH (ref 70–99)
Glucose-Capillary: 260 mg/dL — ABNORMAL HIGH (ref 70–99)
Glucose-Capillary: 264 mg/dL — ABNORMAL HIGH (ref 70–99)

## 2018-12-22 LAB — PROCALCITONIN: Procalcitonin: 5.37 ng/mL

## 2018-12-22 LAB — D-DIMER, QUANTITATIVE: D-Dimer, Quant: 0.81 ug/mL-FEU — ABNORMAL HIGH (ref 0.00–0.50)

## 2018-12-22 LAB — T.PALLIDUM AB, TOTAL: T Pallidum Abs: NONREACTIVE

## 2018-12-22 LAB — C-REACTIVE PROTEIN: CRP: 2 mg/dL — ABNORMAL HIGH (ref <1.0)

## 2018-12-22 MED ORDER — AZITHROMYCIN 250 MG PO TABS
500.0000 mg | ORAL_TABLET | Freq: Every day | ORAL | Status: AC
  Administered 2018-12-22 – 2018-12-23 (×2): 500 mg via ORAL
  Filled 2018-12-22 (×2): qty 2

## 2018-12-22 NOTE — Progress Notes (Signed)
PHARMACIST - PHYSICIAN COMMUNICATION  CONCERNING: Antibiotic IV to Oral Route Change Policy  RECOMMENDATION: This patient is receiving azithromycin by the intravenous route.  Based on criteria approved by the Pharmacy and Therapeutics Committee, the antibiotic(s) is/are being converted to the equivalent oral dose form(s).   DESCRIPTION: These criteria include:  Patient being treated for a respiratory tract infection, urinary tract infection, cellulitis or clostridium difficile associated diarrhea if on metronidazole  The patient is not neutropenic and does not exhibit a GI malabsorption state  The patient is eating (either orally or via tube) and/or has been taking other orally administered medications for a least 24 hours  The patient is improving clinically and has a Tmax < 100.5  If you have questions about this conversion, please contact the Pharmacy Department  []  ( [phone] )  [place] []  ( [phone] )  [facility] []  ( [phone]  [place] []  ( [phone] )  [facility] [x]  ( [phone] )  [facility]  

## 2018-12-22 NOTE — Progress Notes (Signed)
PROGRESS NOTE  Lucas White ZOX:[id number] DOB: Feb 26, 1960 DOA: 12/19/2018 PCP: Sondra ComeWoods, Samuel, MD   LOS: 3 days   Brief Narrative / Interim history: 58 yo M with type 2 diabetes mellitus, high-dose opiates, with recent decline in mental status as an outpatient came into the hospital was admitted for dyspnea and weakness.  He tested positive for COVID-19 he was hypoxic to 75% room air and was admitted to the hospital.  Subjective / 24h Interval events: No complaints other than wanting his oxycodone.  Denies any shortness of breath  Assessment & Plan:  Principal Problem Acute Hypoxic Respiratory Failure due to Covid-19 Viral Illness -Patient admitted to the hospital, started on remdesivir, steroids, continue -Respiratory status improving, currently on room air   COVID-19 Labs  Recent Labs    12/20/18 0325 12/21/18 0233 12/22/18 0243  DDIMER 1.14* 1.16* 0.81*  FERRITIN 444* 347* 262  CRP 12.9* 4.9* 2.0*    No results found for: SARSCOV2NAA  Active Problems Acute toxic metabolic encephalopathy with confusion and agitation -Possibly due to longstanding opiates as well as COVID-19 -Ammonia and B12 normal, TSH low, free T4 mildly elevated.  Overall has improved -Thiamine pending, RPR weakly positive, resume home oxycodone  Acute kidney injury -Resolved, likely prerenal  Uncontrolled type 2 diabetes mellitus with hyperglycemia -Continue Levemir, sliding scale  CBG (last 3)  Recent Labs    12/21/18 1628 12/22/18 0734 12/22/18 1119  GLUCAP 221* 155* 189*      Scheduled Meds: . vitamin C  500 mg Oral Daily  . azithromycin  500 mg Oral Daily  . enoxaparin (LOVENOX) injection  40 mg Subcutaneous Q24H  . escitalopram  20 mg Oral Daily  . gabapentin  300 mg Oral TID  . insulin aspart  0-20 Units Subcutaneous TID WC  . insulin aspart  0-5 Units Subcutaneous QHS  . insulin aspart  6 Units Subcutaneous TID WC  . insulin detemir  0.15 Units/kg Subcutaneous BID  .  Ipratropium-Albuterol  2 puff Inhalation TID  . linagliptin  5 mg Oral Daily  . methylPREDNISolone (SOLU-MEDROL) injection  0.5 mg/kg Intravenous Q12H  . thiamine  250 mg Oral Daily  . Vitamin D (Ergocalciferol)  50,000 Units Oral Once per day on Thu  . zinc sulfate  220 mg Oral Daily   Continuous Infusions: . cefTRIAXone (ROCEPHIN)  IV 2 g (12/22/18 1042)  . remdesivir 100 mg in NS 100 mL 100 mg (12/22/18 0933)  . sodium chloride     PRN Meds:.acetaminophen **OR** acetaminophen, chlorpheniramine-HYDROcodone, guaiFENesin-dextromethorphan, Ipratropium-Albuterol, oxyCODONE  DVT prophylaxis: lovenox Code Status: Full code Family Communication: d/w patient Disposition Plan: home when ready  Consultants:  None   Procedures: none  Microbiology: none  Antimicrobials: none   Objective: Vitals:   12/21/18 0557 12/21/18 1425 12/21/18 2023 12/22/18 0328  BP: 128/79 127/77 (!) 141/88 124/77  Pulse: (!) 57 67 70 73  Resp: 16 20 18 18   Temp: 98.1 F (36.7 C) 98.1 F (36.7 C) 98.2 F (36.8 C) 98.9 F (37.2 C)  TempSrc: Oral Oral Oral   SpO2: 97% 100% 98% 95%  Weight:      Height:        Intake/Output Summary (Last 24 hours) at 12/22/2018 1215 Last data filed at 12/22/2018 0500 Gross per 24 hour  Intake 1970.52 ml  Output --  Net 1970.52 ml   Filed Weights   12/19/18 1111  Weight: 89.3 kg    Examination:  Constitutional: NAD Eyes: no scleral icterus ENMT: Mucous membranes  are moist.  Neck: normal, supple Respiratory: clear to auscultation bilaterally, no wheezing, no crackles. Normal respiratory effort.  Cardiovascular: Regular rate and rhythm, no murmurs / rubs / gallops. No LE edema.  Abdomen: non distended, no tenderness.  Musculoskeletal: no clubbing / cyanosis.  Skin: no rashes Neurologic: CN 2-12 grossly intact. Strength 5/5 in all 4.   Data Reviewed: I have independently reviewed following labs and imaging studies   CBC: Recent Labs  Lab  2019-01-05 0519 12/20/18 0325 12/21/18 0233 12/22/18 0243  WBC 7.2 5.1 5.9 6.7  NEUTROABS 5.7 4.2 5.0 5.6  HGB 11.3* 11.8* 11.0* 11.4*  HCT 33.2* 36.0* 33.8* 34.8*  MCV 86.5 86.7 86.4 87.0  PLT 289 429* 497* 541*   Basic Metabolic Panel: Recent Labs  Lab January 05, 2019 0654 12/20/18 0325 12/21/18 0233 12/22/18 0243  NA 131* 137 139 140  K 4.5 3.9 3.7 3.8  CL 89* 97* 101 102  CO2 GLUCOSE 471* 225* 196* 157*  BUN 30* 25* 21* 17  CREATININE 1.51* 0.93 0.82 0.76  CALCIUM 7.9* 8.7* 8.5* 8.7*  MG 2.9* 2.8* 2.4 2.2  PHOS 3.8 2.7 3.0 3.6   GFR: Estimated Creatinine Clearance: 109.8 mL/min (by C-G formula based on SCr of 0.76 mg/dL). Liver Function Tests: Recent Labs  Lab January 05, 2019 0654 12/20/18 0325 12/21/18 0233 12/22/18 0243  AST 53* 54* 32 24  ALT 42 50* 39 37  ALKPHOS 82 86 71 74  BILITOT 0.8 0.4 0.8 0.6  PROT 7.4 7.8 6.7 6.7  ALBUMIN 3.0* 3.1* 2.8* 2.8*   No results for input(s): LIPASE, AMYLASE in the last 168 hours. Recent Labs  Lab 12/20/18 0933  AMMONIA 16   Coagulation Profile: No results for input(s): INR, PROTIME in the last 168 hours. Cardiac Enzymes: Recent Labs  Lab 12/20/18 0933 12/21/18 0233  CKTOTAL 654* 299   BNP (last 3 results) No results for input(s): PROBNP in the last 8760 hours. HbA1C: No results for input(s): HGBA1C in the last 72 hours. CBG: Recent Labs  Lab 12/21/18 0741 12/21/18 1145 12/21/18 1628 12/22/18 0734 12/22/18 1119  GLUCAP 184* 251* 221* 155* 189*   Lipid Profile: No results for input(s): CHOL, HDL, LDLCALC, TRIG, CHOLHDL, LDLDIRECT in the last 72 hours. Thyroid Function Tests: Recent Labs    12/20/18 0933 12/20/18 0939  TSH 0.051*  --   FREET4  --  1.21*   Anemia Panel: Recent Labs    12/20/18 0933 12/21/18 0233 12/22/18 0243  VITAMINB12 1,627*  --   --   FERRITIN  --  347* 262   Urine analysis:    Component Value Date/Time   COLORURINE YELLOW 01-05-19 0721   APPEARANCEUR CLEAR  01/05/19 0721   LABSPEC 1.027 2019/01/05 0721   PHURINE 5.0 01-05-2019 0721   GLUCOSEU >=500 (A) 01-05-2019 0721   HGBUR MODERATE (A) 05-Jan-2019 0721   BILIRUBINUR NEGATIVE 01-05-2019 0721   KETONESUR NEGATIVE 2019/01/05 0721   PROTEINUR 100 (A) January 05, 2019 0721   NITRITE NEGATIVE 01/05/2019 0721   LEUKOCYTESUR NEGATIVE 01-05-2019 0721   Sepsis Labs: Invalid input(s): PROCALCITONIN, LACTICIDVEN  Recent Results (from the past 240 hour(s))  Blood culture (routine x 2)     Status: None   Collection Time: 12/13/18 10:04 AM   Specimen: BLOOD  Result Value Ref Range Status   Specimen Description   Final    BLOOD RIGHT ANTECUBITAL Performed at [facility], [address], [place], [place] [address]    Special Requests  Final    BOTTLES DRAWN AEROBIC AND ANAEROBIC Blood Culture adequate volume Performed at [facility], [address], [place], [place] [address]    Culture   Final    NO GROWTH 5 DAYS Performed at [facility], [address], [place], [place] [address]    Report Status 12/18/2018 FINAL  Final  Blood culture (routine x 2)     Status: None   Collection Time: 12/13/18 10:33 AM   Specimen: BLOOD  Result Value Ref Range Status   Specimen Description   Final    BLOOD RIGHT ARM Performed at [facility], [address], [place], [place] [address]    Special Requests   Final    BOTTLES DRAWN AEROBIC AND ANAEROBIC Blood Culture results may not be optimal due to an excessive volume of blood received in culture bottles Performed at [facility], [address], [place], [place] [address]    Culture   Final    NO GROWTH 5 DAYS Performed at [facility], [address], [place], [place] [address]    Report Status 12/18/2018 FINAL  Final  Blood Culture (routine x 2)     Status: None (Preliminary result)   Collection Time: 12/19/18  5:43 AM   Specimen: BLOOD  Result Value Ref Range  Status   Specimen Description   Final    BLOOD SITE NOT SPECIFIED Performed at [facility], [address], [place], [place] [address]    Special Requests   Final    BOTTLES DRAWN AEROBIC AND ANAEROBIC Blood Culture adequate volume Performed at [facility], [address], [place], [place] [address]    Culture   Final    NO GROWTH 3 DAYS Performed at [facility], [address] [place], [place] [address]    Report Status PENDING  Incomplete  Blood Culture (routine x 2)     Status: None (Preliminary result)   Collection Time: 12/19/18  5:55 AM   Specimen: BLOOD  Result Value Ref Range Status   Specimen Description   Final    BLOOD RIGHT ANTECUBITAL Performed at [facility], [address] [place], [place] [address]    Special Requests   Final    BOTTLES DRAWN AEROBIC AND ANAEROBIC Blood Culture adequate volume Performed at [facility], [address] [place], [place] [address]    Culture   Final    NO GROWTH 3 DAYS Performed at [facility], [address] [place], [place] [address]    Report Status PENDING  Incomplete  Culture, sputum-assessment     Status: None   Collection Time: 12/19/18 10:53 AM   Specimen: Sputum  Result Value Ref Range Status   Specimen Description SPUTUM  Final   Special Requests Immunocompromised  Final   Sputum evaluation   Final    THIS SPECIMEN IS ACCEPTABLE FOR SPUTUM CULTURE Performed at [facility], [address] [place], [place] [address]    Report Status 12/19/2018 FINAL  Final  Culture, respiratory     Status: None (Preliminary result)   Collection Time: 12/19/18 10:53 AM   Specimen: SPU  Result Value Ref Range Status   Specimen Description   Final    SPUTUM Performed at [facility], [address] [place], [place] [address]    Special Requests   Final    Immunocompromised Reflexed from  [id number] Performed at [facility], [place] [address], [place], [place] [address]    Gram Stain   Final    RARE WBC PRESENT, PREDOMINANTLY PMN MODERATE GRAM NEGATIVE RODS FEW GRAM POSITIVE COCCI IN PAIRS IN CHAINS RARE GRAM POSITIVE RODS RARE YEAST Performed at [facility], [place] [address] [place], [place] [address]    Culture   Final    FEW STAPHYLOCOCCUS AUREUS MODERATE HAEMOPHILUS INFLUENZAE    Report Status PENDING  Incomplete   Organism ID, Bacteria STAPHYLOCOCCUS AUREUS  Final      Susceptibility   Staphylococcus aureus - MIC*    CIPROFLOXACIN <=0.5 SENSITIVE Sensitive     ERYTHROMYCIN <=0.25 SENSITIVE Sensitive     GENTAMICIN <=0.5 SENSITIVE Sensitive     OXACILLIN 0.5 SENSITIVE Sensitive     TETRACYCLINE <=1 SENSITIVE Sensitive     VANCOMYCIN 1 SENSITIVE Sensitive     TRIMETH/SULFA <=10 SENSITIVE Sensitive     CLINDAMYCIN <=0.25 SENSITIVE Sensitive     RIFAMPIN <=0.5 SENSITIVE Sensitive     Inducible Clindamycin NEGATIVE Sensitive     * FEW STAPHYLOCOCCUS AUREUS      Radiology Studies: No results found.   Marzetta Board, MD, PhD [facility]  Contact via  [email or web address]  [place] P: [phone] F: [phone]

## 2018-12-23 DIAGNOSIS — E669 Obesity, unspecified: Secondary | ICD-10-CM

## 2018-12-23 LAB — COMPREHENSIVE METABOLIC PANEL
ALT: 34 U/L (ref 0–44)
AST: 19 U/L (ref 15–41)
Albumin: 3 g/dL — ABNORMAL LOW (ref 3.5–5.0)
Alkaline Phosphatase: 68 U/L (ref 38–126)
Anion gap: 9 (ref 5–15)
BUN: 19 mg/dL (ref 6–20)
CO2: 27 mmol/L (ref 22–32)
Calcium: 8.6 mg/dL — ABNORMAL LOW (ref 8.9–10.3)
Chloride: 103 mmol/L (ref 98–111)
Creatinine, Ser: 0.77 mg/dL (ref 0.61–1.24)
GFR calc Af Amer: >60 mL/min (ref >60)
GFR calc non Af Amer: >60 mL/min (ref >60)
Glucose, Bld: 231 mg/dL — ABNORMAL HIGH (ref 70–99)
Potassium: 4.2 mmol/L (ref 3.5–5.1)
Sodium: 139 mmol/L (ref 135–145)
Total Bilirubin: 0.8 mg/dL (ref 0.3–1.2)
Total Protein: 6.5 g/dL (ref 6.5–8.1)

## 2018-12-23 LAB — CBC WITH DIFFERENTIAL/PLATELET
Abs Immature Granulocytes: 0.05 10*3/uL (ref 0.00–0.07)
Basophils Absolute: 0 10*3/uL (ref 0.0–0.1)
Basophils Relative: 0 %
Eosinophils Absolute: 0 10*3/uL (ref 0.0–0.5)
Eosinophils Relative: 0 %
HCT: 36 % — ABNORMAL LOW (ref 39.0–52.0)
Hemoglobin: 11.7 g/dL — ABNORMAL LOW (ref 13.0–17.0)
Immature Granulocytes: 1 %
Lymphocytes Relative: 10 %
Lymphs Abs: 0.7 10*3/uL (ref 0.7–4.0)
MCH: 28.3 pg (ref 26.0–34.0)
MCHC: 32.5 g/dL (ref 30.0–36.0)
MCV: 87.2 fL (ref 80.0–100.0)
Monocytes Absolute: 0.3 10*3/uL (ref 0.1–1.0)
Monocytes Relative: 5 %
Neutro Abs: 6 10*3/uL (ref 1.7–7.7)
Neutrophils Relative %: 84 %
Platelets: 615 10*3/uL — ABNORMAL HIGH (ref 150–400)
RBC: 4.13 MIL/uL — ABNORMAL LOW (ref 4.22–5.81)
RDW: 11.9 % (ref 11.5–15.5)
WBC: 7 10*3/uL (ref 4.0–10.5)
nRBC: 0 % (ref 0.0–0.2)

## 2018-12-23 LAB — GLUCOSE, CAPILLARY
Glucose-Capillary: 242 mg/dL — ABNORMAL HIGH (ref 70–99)
Glucose-Capillary: 189 mg/dL — ABNORMAL HIGH (ref 70–99)
Glucose-Capillary: 129 mg/dL — ABNORMAL HIGH (ref 70–99)

## 2018-12-23 LAB — C-REACTIVE PROTEIN: CRP: 1 mg/dL — ABNORMAL HIGH (ref <1.0)

## 2018-12-23 LAB — FERRITIN: Ferritin: 239 ng/mL (ref 24–336)

## 2018-12-23 LAB — D-DIMER, QUANTITATIVE: D-Dimer, Quant: 0.7 ug/mL-FEU — ABNORMAL HIGH (ref 0.00–0.50)

## 2018-12-23 LAB — PHOSPHORUS: Phosphorus: 3.9 mg/dL (ref 2.5–4.6)

## 2018-12-23 LAB — MAGNESIUM: Magnesium: 2.3 mg/dL (ref 1.7–2.4)

## 2018-12-23 MED ORDER — DEXAMETHASONE 6 MG PO TABS: 6.0000 mg | ORAL_TABLET | Freq: Every day | ORAL | 0 refills | Status: DC

## 2018-12-23 MED ORDER — GUAIFENESIN-DM 100-10 MG/5ML PO SYRP: 10.0000 mL | ORAL_SOLUTION | ORAL | 0 refills | Status: DC | PRN

## 2018-12-23 NOTE — Progress Notes (Signed)
Pt discharged, spoke with spouse, plan to pick pt up and discharge at 3 pm today. SRP, RN

## 2018-12-23 NOTE — Progress Notes (Signed)
Pt wife [place] called ask again about discharge instructions. Pt wife crying stating pt is non-complaint with discharge instructions pertaining to COVID procedures. Pt does wear mask and ambulate around the home in the house without his mask. Educated pt and wife once again about Covid home care. SRP, RN

## 2018-12-23 NOTE — Plan of Care (Signed)
  Problem: Health Behavior/Discharge Planning: Goal: Ability to manage health-related needs will improve Outcome: Progressing   Problem: Clinical Measurements: Goal: Ability to maintain clinical measurements within normal limits will improve Outcome: Progressing Goal: Will remain free from infection Outcome: Progressing Goal: Diagnostic test results will improve Outcome: Completed/Met Goal: Respiratory complications will improve Outcome: Progressing Goal: Cardiovascular complication will be avoided Outcome: Progressing

## 2018-12-23 NOTE — Discharge Instructions (Signed)
Follow with [name], MD in 2 weeks  Please get a complete blood count and chemistry panel checked by your Primary MD at your next visit, and again as instructed by your Primary MD. Please get your medications reviewed and adjusted by your Primary MD.  Please request your Primary MD to go over all Hospital Tests and Procedure/Radiological results at the follow up, please get all Hospital records sent to your Prim MD by signing hospital release before you go home.  In some cases, there will be blood work, cultures and biopsy results pending at the time of your discharge. Please request that your primary care M.D. goes through all the records of your hospital data and follows up on these results.  If you had Pneumonia of Lung problems at the Hospital: Please get a 2 view Chest X ray done in 6-8 weeks after hospital discharge or sooner if instructed by your Primary MD.  If you have Congestive Heart Failure: Please call your Cardiologist or Primary MD anytime you have any of the following symptoms:  1) 3 pound weight gain in 24 hours or 5 pounds in 1 week  2) shortness of breath, with or without a dry hacking cough  3) swelling in the hands, feet or stomach  4) if you have to sleep on extra pillows at night in order to breathe  Follow cardiac low salt diet and 1.5 lit/day fluid restriction.  If you have diabetes Accuchecks 4 times/day, Once in AM empty stomach and then before each meal. Log in all results and show them to your primary doctor at your next visit. If any glucose reading is under 80 or above 300 call your primary MD immediately.  If you have Seizure/Convulsions/Epilepsy: Please do not drive, operate heavy machinery, participate in activities at heights or participate in high speed sports until you have seen by Primary MD or a Neurologist and advised to do so again. Per [facility] statutes, patients with seizures are not allowed to drive until they have been  seizure-free for six months.  Use caution when using heavy equipment or power tools. Avoid working on ladders or at heights. Take showers instead of baths. Ensure the water temperature is not too high on the home water heater. Do not go swimming alone. Do not lock yourself in a room alone (i.e. bathroom). When caring for infants or small children, sit down when holding, feeding, or changing them to minimize risk of injury to the child in the event you have a seizure. Maintain good sleep hygiene. Avoid alcohol.   If you had Gastrointestinal Bleeding: Please ask your Primary MD to check a complete blood count within one week of discharge or at your next visit. Your endoscopic/colonoscopic biopsies that are pending at the time of discharge, will also need to followed by your Primary MD.  Get Medicines reviewed and adjusted. Please take all your medications with you for your next visit with your Primary MD  Please request your Primary MD to go over all hospital tests and procedure/radiological results at the follow up, please ask your Primary MD to get all Hospital records sent to his/her office.  If you experience worsening of your admission symptoms, develop shortness of breath, life threatening emergency, suicidal or homicidal thoughts you must seek medical attention immediately by calling 911 or calling your MD immediately  if symptoms less severe.  You must read complete instructions/literature along with all the possible adverse reactions/side effects for all the Medicines you take  and that have been prescribed to you. Take any new Medicines after you have completely understood and accpet all the possible adverse reactions/side effects.   Do not drive or operate heavy machinery when taking Pain medications.   Do not take more than prescribed Pain, Sleep and Anxiety Medications  Special Instructions: If you have smoked or chewed Tobacco  in the last 2 yrs please stop smoking, stop any regular  Alcohol  and or any Recreational drug use.  Wear Seat belts while driving.  Please note You were cared for by a hospitalist during your hospital stay. If you have any questions about your discharge medications or the care you received while you were in the hospital after you are discharged, you can call the unit and asked to speak with the hospitalist on call if the hospitalist that took care of you is not available. Once you are discharged, your primary care physician will handle any further medical issues. Please note that NO REFILLS for any discharge medications will be authorized once you are discharged, as it is imperative that you return to your primary care physician (or establish a relationship with a primary care physician if you do not have one) for your aftercare needs so that they can reassess your need for medications and monitor your lab values.  You can reach the hospitalist office at phone [phone] or fax [phone]   If you do not have a primary care physician, you can call [phone] for a physician referral.  Activity: As tolerated with Full fall precautions use walker/cane & assistance as needed    Diet: diabetic  Disposition Home     COVID-19 COVID-19 is a respiratory infection that is caused by a virus called severe acute respiratory syndrome coronavirus 2 (SARS-CoV-2). The disease is also known as coronavirus disease or novel coronavirus. In some people, the virus may not cause any symptoms. In others, it may cause a serious infection. The infection can get worse quickly and can lead to complications, such as:  Pneumonia, or infection of the lungs.  Acute respiratory distress syndrome or ARDS. This is fluid build-up in the lungs.  Acute respiratory failure. This is a condition in which there is not enough oxygen passing from the lungs to the body.  Sepsis or septic shock. This is a serious bodily reaction to an infection.  Blood clotting  problems.  Secondary infections due to bacteria or fungus. The virus that causes COVID-19 is contagious. This means that it can spread from person to person through droplets from coughs and sneezes (respiratory secretions). What are the causes? This illness is caused by a virus. You may catch the virus by:  Breathing in droplets from an infected person's cough or sneeze.  Touching something, like a table or a doorknob, that was exposed to the virus (contaminated) and then touching your mouth, nose, or eyes. What increases the risk? Risk for infection You are more likely to be infected with this virus if you:  Live in or travel to an area with a COVID-19 outbreak.  Come in contact with a sick person who recently traveled to an area with a COVID-19 outbreak.  Provide care for or live with a person who is infected with COVID-19. Risk for serious illness You are more likely to become seriously ill from the virus if you:  Are 88 years of age or older.  Have a long-term disease that lowers your body's ability to fight infection (immunocompromised).  Live in a nursing home  or long-term care facility.  Have a long-term (chronic) disease such as: ? Chronic lung disease, including chronic obstructive pulmonary disease or asthma ? Heart disease. ? Diabetes. ? Chronic kidney disease. ? Liver disease.  Are obese. What are the signs or symptoms? Symptoms of this condition can range from mild to severe. Symptoms may appear any time from 2 to 14 days after being exposed to the virus. They include:  A fever.  A cough.  Difficulty breathing.  Chills.  Muscle pains.  A sore throat.  Loss of taste or smell. Some people may also have stomach problems, such as nausea, vomiting, or diarrhea. Other people may not have any symptoms of COVID-19. How is this diagnosed? This condition may be diagnosed based on:  Your signs and symptoms, especially if: ? You live in an area with a  COVID-19 outbreak. ? You recently traveled to or from an area where the virus is common. ? You provide care for or live with a person who was diagnosed with COVID-19.  A physical exam.  Lab tests, which may include: ? A nasal swab to take a sample of fluid from your nose. ? A throat swab to take a sample of fluid from your throat. ? A sample of mucus from your lungs (sputum). ? Blood tests.  Imaging tests, which may include, X-rays, CT scan, or ultrasound. How is this treated? At present, there is no medicine to treat COVID-19. Medicines that treat other diseases are being used on a trial basis to see if they are effective against COVID-19. Your health care provider will talk with you about ways to treat your symptoms. For most people, the infection is mild and can be managed at home with rest, fluids, and over-the-counter medicines. Treatment for a serious infection usually takes places in a hospital intensive care unit (ICU). It may include one or more of the following treatments. These treatments are given until your symptoms improve.  Receiving fluids and medicines through an IV.  Supplemental oxygen. Extra oxygen is given through a tube in the nose, a face mask, or a hood.  Positioning you to lie on your stomach (prone position). This makes it easier for oxygen to get into the lungs.  Continuous positive airway pressure (CPAP) or bi-level positive airway pressure (BPAP) machine. This treatment uses mild air pressure to keep the airways open. A tube that is connected to a motor delivers oxygen to the body.  Ventilator. This treatment moves air into and out of the lungs by using a tube that is placed in your windpipe.  Tracheostomy. This is a procedure to create a hole in the neck so that a breathing tube can be inserted.  Extracorporeal membrane oxygenation (ECMO). This procedure gives the lungs a chance to recover by taking over the functions of the heart and lungs. It supplies  oxygen to the body and removes carbon dioxide. Follow these instructions at home: Lifestyle  If you are sick, stay home except to get medical care. Your health care provider will tell you how long to stay home. Call your health care provider before you go for medical care.  Rest at home as told by your health care provider.  Do not use any products that contain nicotine or tobacco, such as cigarettes, e-cigarettes, and chewing tobacco. If you need help quitting, ask your health care provider.  Return to your normal activities as told by your health care provider. Ask your health care provider what activities are safe for  you. General instructions  Take over-the-counter and prescription medicines only as told by your health care provider.  Drink enough fluid to keep your urine pale yellow.  Keep all follow-up visits as told by your health care provider. This is important. How is this prevented?  There is no vaccine to help prevent COVID-19 infection. However, there are steps you can take to protect yourself and others from this virus. To protect yourself:   Do not travel to areas where COVID-19 is a risk. The areas where COVID-19 is reported change often. To identify high-risk areas and travel restrictions, check the CDC travel website: StageSync.si  If you live in, or must travel to, an area where COVID-19 is a risk, take precautions to avoid infection. ? Stay away from people who are sick. ? Wash your hands often with soap and water for 20 seconds. If soap and water are not available, use an alcohol-based hand sanitizer. ? Avoid touching your mouth, face, eyes, or nose. ? Avoid going out in public, follow guidance from your state and local health authorities. ? If you must go out in public, wear a cloth face covering or face mask. ? Disinfect objects and surfaces that are frequently touched every day. This may include:  Counters and tables.  Doorknobs and light  switches.  Sinks and faucets.  Electronics, such as phones, remote controls, keyboards, computers, and tablets. To protect others: If you have symptoms of COVID-19, take steps to prevent the virus from spreading to others.  If you think you have a COVID-19 infection, contact your health care provider right away. Tell your health care team that you think you may have a COVID-19 infection.  Stay home. Leave your house only to seek medical care. Do not use public transport.  Do not travel while you are sick.  Wash your hands often with soap and water for 20 seconds. If soap and water are not available, use alcohol-based hand sanitizer.  Stay away from other members of your household. Let healthy household members care for children and pets, if possible. If you have to care for children or pets, wash your hands often and wear a mask. If possible, stay in your own room, separate from others. Use a different bathroom.  Make sure that all people in your household wash their hands well and often.  Cough or sneeze into a tissue or your sleeve or elbow. Do not cough or sneeze into your hand or into the air.  Wear a cloth face covering or face mask. Where to find more information  Centers for Disease Control and Prevention: StickerEmporium.tn  World Health Organization: https://thompson-craig.com/ Contact a health care provider if:  You live in or have traveled to an area where COVID-19 is a risk and you have symptoms of the infection.  You have had contact with someone who has COVID-19 and you have symptoms of the infection. Get help right away if:  You have trouble breathing.  You have pain or pressure in your chest.  You have confusion.  You have bluish lips and fingernails.  You have difficulty waking from sleep.  You have symptoms that get worse. These symptoms may represent a serious problem that is an emergency. Do not wait to see if the  symptoms will go away. Get medical help right away. Call your local emergency services (911 in the [place] Do not drive yourself to the hospital. Let the emergency medical personnel know if you think you have COVID-19. Summary  COVID-19  is a respiratory infection that is caused by a virus. It is also known as coronavirus disease or novel coronavirus. It can cause serious infections, such as pneumonia, acute respiratory distress syndrome, acute respiratory failure, or sepsis.  The virus that causes COVID-19 is contagious. This means that it can spread from person to person through droplets from coughs and sneezes.  You are more likely to develop a serious illness if you are 41 years of age or older, have a weak immunity, live in a nursing home, or have chronic disease.  There is no medicine to treat COVID-19. Your health care provider will talk with you about ways to treat your symptoms.  Take steps to protect yourself and others from infection. Wash your hands often and disinfect objects and surfaces that are frequently touched every day. Stay away from people who are sick and wear a mask if you are sick. This information is not intended to replace advice given to you by your health care provider. Make sure you discuss any questions you have with your health care provider. Document Released: 01/24/2018 Document Revised: 05/16/2018 Document Reviewed: 01/24/2018 Elsevier Patient Education  2020 Elsevier Inc.  COVID-19: How to Protect Yourself and Others Know how it spreads  There is currently no vaccine to prevent coronavirus disease 2019 (COVID-19).  The best way to prevent illness is to avoid being exposed to this virus.  The virus is thought to spread mainly from person-to-person. ? Between people who are in close contact with one another (within about 6 feet). ? Through respiratory droplets produced when an infected person coughs, sneezes or talks. ? These droplets can land in the mouths  or noses of people who are nearby or possibly be inhaled into the lungs. ? Some recent studies have suggested that COVID-19 may be spread by people who are not showing symptoms. Everyone should Clean your hands often  Wash your hands often with soap and water for at least 20 seconds especially after you have been in a public place, or after blowing your nose, coughing, or sneezing.  If soap and water are not readily available, use a hand sanitizer that contains at least 60% alcohol. Cover all surfaces of your hands and rub them together until they feel dry.  Avoid touching your eyes, nose, and mouth with unwashed hands. Avoid close contact  Stay home if you are sick.  Avoid close contact with people who are sick.  Put distance between yourself and other people. ? Remember that some people without symptoms may be able to spread virus. ? This is especially important for people who are at higher risk of getting very RetroStamps.it Cover your mouth and nose with a cloth face cover when around others  You could spread COVID-19 to others even if you do not feel sick.  Everyone should wear a cloth face cover when they have to go out in public, for example to the grocery store or to pick up other necessities. ? Cloth face coverings should not be placed on young children under age 36, anyone who has trouble breathing, or is unconscious, incapacitated or otherwise unable to remove the mask without assistance.  The cloth face cover is meant to protect other people in case you are infected.  Do NOT use a facemask meant for a Research scientist (physical sciences).  Continue to keep about 6 feet between yourself and others. The cloth face cover is not a substitute for social distancing. Cover coughs and sneezes  If you are in  a private setting and do not have on your cloth face covering, remember to always cover your mouth and nose with a tissue  when you cough or sneeze or use the inside of your elbow.  Throw used tissues in the trash.  Immediately wash your hands with soap and water for at least 20 seconds. If soap and water are not readily available, clean your hands with a hand sanitizer that contains at least 60% alcohol. Clean and disinfect  Clean AND disinfect frequently touched surfaces daily. This includes tables, doorknobs, light switches, countertops, handles, desks, phones, keyboards, toilets, faucets, and sinks. ktimeonline.com  If surfaces are dirty, clean them: Use detergent or soap and water prior to disinfection.  Then, use a household disinfectant. You can see a list of EPA-registered household disinfectants here. SouthAmericaFlowers.co.uk 05/07/2018 This information is not intended to replace advice given to you by your health care provider. Make sure you discuss any questions you have with your health care provider. Document Released: 04/16/2018 Document Revised: 05/15/2018 Document Reviewed: 04/16/2018 Elsevier Patient Education  2020 [employer].   COVID-19 Frequently Asked Questions COVID-19 (coronavirus disease) is an infection that is caused by a large family of viruses. Some viruses cause illness in people and others cause illness in animals like camels, cats, and bats. In some cases, the viruses that cause illness in animals can spread to humans. Where did the coronavirus come from? In December 2019, [place] told the [employer] [facility]) of several cases of lung disease (human respiratory illness). These cases were linked to an open seafood and livestock market in the city of [place]. The link to the seafood and livestock market suggests that the virus may have spread from animals to humans. However, since that first outbreak in December, the virus has also been shown to spread from person to person. What is the name of the disease and the  virus? Disease name Early on, this disease was called novel coronavirus. This is because scientists determined that the disease was caused by a new (novel) respiratory virus. The World Health Organization [facility]) has now named the disease COVID-19, or coronavirus disease. Virus name The virus that causes the disease is called severe acute respiratory syndrome coronavirus 2 (SARS-CoV-2). More information on disease and virus naming World Health Organization [facility]): www.who.int/emergencies/diseases/novel-coronavirus-2019/technical-guidance/naming-the-coronavirus-disease-(covid-2019)-and-the-virus-that-causes-it Who is at risk for complications from coronavirus disease? Some people may be at higher risk for complications from coronavirus disease. This includes older adults and people who have chronic diseases, such as heart disease, diabetes, and lung disease. If you are at higher risk for complications, take these extra precautions:  Avoid close contact with people who are sick or have a fever or cough. Stay at least 3-6 ft (1-2 m) away from them, if possible.  Wash your hands often with soap and water for at least 20 seconds.  Avoid touching your face, mouth, nose, or eyes.  Keep supplies on hand at home, such as food, medicine, and cleaning supplies.  Stay home as much as possible.  Avoid social gatherings and travel. How does coronavirus disease spread? The virus that causes coronavirus disease spreads easily from person to person (is contagious). There are also cases of community-spread disease. This means the disease has spread to:  People who have no known contact with other infected people.  People who have not traveled to areas where there are known cases. It appears to spread from one person to another through droplets from coughing or sneezing. Can I get the virus  from touching surfaces or objects? There is still a lot that we do not know about the virus that causes coronavirus  disease. Scientists are basing a lot of information on what they know about similar viruses, such as:  Viruses cannot generally survive on surfaces for long. They need a human body (host) to survive.  It is more likely that the virus is spread by close contact with people who are sick (direct contact), such as through: ? Shaking hands or hugging. ? Breathing in respiratory droplets that travel through the air. This can happen when an infected person coughs or sneezes on or near other people.  It is less likely that the virus is spread when a person touches a surface or object that has the virus on it (indirect contact). The virus may be able to enter the body if the person touches a surface or object and then touches his or her face, eyes, nose, or mouth. Can a person spread the virus without having symptoms of the disease? It may be possible for the virus to spread before a person has symptoms of the disease, but this is most likely not the main way the virus is spreading. It is more likely for the virus to spread by being in close contact with people who are sick and breathing in the respiratory droplets of a sick person's cough or sneeze. What are the symptoms of coronavirus disease? Symptoms vary from person to person and can range from mild to severe. Symptoms may include:  Fever.  Cough.  Tiredness, weakness, or fatigue.  Fast breathing or feeling short of breath. These symptoms can appear anywhere from 2 to 14 days after you have been exposed to the virus. If you develop symptoms, call your health care provider. People with severe symptoms may need hospital care. If I am exposed to the virus, how long does it take before symptoms start? Symptoms of coronavirus disease may appear anywhere from 2 to 14 days after a person has been exposed to the virus. If you develop symptoms, call your health care provider. Should I be tested for this virus? Your health care provider will decide  whether to test you based on your symptoms, history of exposure, and your risk factors. How does a health care provider test for this virus? Health care providers will collect samples to send for testing. Samples may include:  Taking a swab of fluid from the nose.  Taking fluid from the lungs by having you cough up mucus (sputum) into a sterile cup.  Taking a blood sample.  Taking a stool or urine sample. Is there a treatment or vaccine for this virus? Currently, there is no vaccine to prevent coronavirus disease. Also, there are no medicines like antibiotics or antivirals to treat the virus. A person who becomes sick is given supportive care, which means rest and fluids. A person may also relieve his or her symptoms by using over-the-counter medicines that treat sneezing, coughing, and runny nose. These are the same medicines that a person takes for the common cold. If you develop symptoms, call your health care provider. People with severe symptoms may need hospital care. What can I do to protect myself and my family from this virus?     You can protect yourself and your family by taking the same actions that you would take to prevent the spread of other viruses. Take the following actions:  Wash your hands often with soap and water for at least 20 seconds.  If soap and water are not available, use alcohol-based hand sanitizer.  Avoid touching your face, mouth, nose, or eyes.  Cough or sneeze into a tissue, sleeve, or elbow. Do not cough or sneeze into your hand or the air. ? If you cough or sneeze into a tissue, throw it away immediately and wash your hands.  Disinfect objects and surfaces that you frequently touch every day.  Avoid close contact with people who are sick or have a fever or cough. Stay at least 3-6 ft (1-2 m) away from them, if possible.  Stay home if you are sick, except to get medical care. Call your health care provider before you get medical care.  Make sure  your vaccines are up to date. Ask your health care provider what vaccines you need. What should I do if I need to travel? Follow travel recommendations from your local health authority, the CDC, and WHO. Travel information and advice  Centers for Disease Control and Prevention (CDC): GeminiCard.gl  World Health Organization [facility]): PreviewDomains.se Know the risks and take action to protect your health  You are at higher risk of getting coronavirus disease if you are traveling to areas with an outbreak or if you are exposed to travelers from areas with an outbreak.  Wash your hands often and practice good hygiene to lower the risk of catching or spreading the virus. What should I do if I am sick? General instructions to stop the spread of infection  Wash your hands often with soap and water for at least 20 seconds. If soap and water are not available, use alcohol-based hand sanitizer.  Cough or sneeze into a tissue, sleeve, or elbow. Do not cough or sneeze into your hand or the air.  If you cough or sneeze into a tissue, throw it away immediately and wash your hands.  Stay home unless you must get medical care. Call your health care provider or local health authority before you get medical care.  Avoid public areas. Do not take public transportation, if possible.  If you can, wear a mask if you must go out of the house or if you are in close contact with someone who is not sick. Keep your home clean  Disinfect objects and surfaces that are frequently touched every day. This may include: ? Counters and tables. ? Doorknobs and light switches. ? Sinks and faucets. ? Electronics such as phones, remote controls, keyboards, computers, and tablets.  Wash dishes in hot, soapy water or use a dishwasher. Air-dry your dishes.  Wash laundry in hot water. Prevent infecting other household  members  Let healthy household members care for children and pets, if possible. If you have to care for children or pets, wash your hands often and wear a mask.  Sleep in a different bedroom or bed, if possible.  Do not share personal items, such as razors, toothbrushes, deodorant, combs, brushes, towels, and washcloths. Where to find more information Centers for Disease Control and Prevention (CDC)  Information and news updates: CardRetirement.cz World Health Organization [facility])  Information and news updates: AffordableSalon.es  Coronavirus health topic: https://thompson-craig.com/  Questions and answers on COVID-19: kruiseway.com  Global tracker: who.sprinklr.com American Academy of Pediatrics (AAP)  Information for families: www.healthychildren.org/English/health-issues/conditions/chest-lungs/Pages/2019-Novel-Coronavirus.aspx The coronavirus situation is changing rapidly. Check your local health authority website or the CDC and [facility] websites for updates and news. When should I contact a health care provider?  Contact your health care provider if you have symptoms of an infection, such as  fever or cough, and you: ? Have been near anyone who is known to have coronavirus disease. ? Have come into contact with a person who is suspected to have coronavirus disease. ? Have traveled outside of the country. When should I get emergency medical care?  Get help right away by calling your local emergency services (911 in the [place] if you have: ? Trouble breathing. ? Pain or pressure in your chest. ? Confusion. ? Blue-tinged lips and fingernails. ? Difficulty waking from sleep. ? Symptoms that get worse. Let the emergency medical personnel know if you think you have coronavirus disease. Summary  A new respiratory virus is spreading from person to person and causing COVID-19 (coronavirus  disease).  The virus that causes COVID-19 appears to spread easily. It spreads from one person to another through droplets from coughing or sneezing.  Older adults and those with chronic diseases are at higher risk of disease. If you are at higher risk for complications, take extra precautions.  There is currently no vaccine to prevent coronavirus disease. There are no medicines, such as antibiotics or antivirals, to treat the virus.  You can protect yourself and your family by washing your hands often, avoiding touching your face, and covering your coughs and sneezes. This information is not intended to replace advice given to you by your health care provider. Make sure you discuss any questions you have with your health care provider. Document Released: 04/16/2018 Document Revised: 04/16/2018 Document Reviewed: 04/16/2018 Elsevier Patient Education  2020 [employer].  Prevent the Spread of COVID-19 if You Are Sick If you are sick with COVID-19 or think you might have COVID-19, follow the steps below to help protect other people in your home and community. Stay home except to get medical care.  Stay home. Most people with COVID-19 have mild illness and are able to recover at home without medical care. Do not leave your home, except to get medical care. Do not visit public areas.  Take care of yourself. Get rest and stay hydrated.  Get medical care when needed. Call your doctor before you go to their office for care. But, if you have trouble breathing or other concerning symptoms, call 911 for immediate help.  Avoid public transportation, ride-sharing, or taxis. Separate yourself from other people and pets in your home.  As much as possible, stay in a specific room and away from other people and pets in your home. Also, you should use a separate bathroom, if available. If you need to be around other people or animals in or outside of the home, wear a cloth face covering. ? See COVID-19  and Animals if you have questions about pets: RebateDates.com.br Monitor your symptoms.  Common symptoms of COVID-19 include fever and cough. Trouble breathing is a more serious symptom that means you should get medical attention.  Follow care instructions from your healthcare provider and local health department. Your local health authorities will give instructions on checking your symptoms and reporting information. If you develop emergency warning signs for COVID-19 get medical attention immediately.  Emergency warning signs include*:  Trouble breathing  Persistent pain or pressure in the chest  New confusion or not able to be woken  Bluish lips or face *This list is not all inclusive. Please consult your medical provider for any other symptoms that are severe or concerning to you. Call 911 if you have a medical emergency. If you have a medical emergency and need to call 911, notify the operator that  you have or think you might have, COVID-19. If possible, put on a facemask before medical help arrives. Call ahead before visiting your doctor.  Call ahead. Many medical visits for routine care are being postponed or done by phone or telemedicine.  If you have a medical appointment that cannot be postponed, call your doctor's office. This will help the office protect themselves and other patients. If you are sick, wear a cloth covering over your nose and mouth.  You should wear a cloth face covering over your nose and mouth if you must be around other people or animals, including pets (even at home).  You don't need to wear the cloth face covering if you are alone. If you can't put on a cloth face covering (because of trouble breathing for example), cover your coughs and sneezes in some other way. Try to stay at least 6 feet away from other people. This will help protect the people around you. Note: During the COVID-19 pandemic, medical grade  facemasks are reserved for healthcare workers and some first responders. You may need to make a cloth face covering using a scarf or bandana. Cover your coughs and sneezes.  Cover your mouth and nose with a tissue when you cough or sneeze.  Throw used tissues in a lined trash can.  Immediately wash your hands with soap and water for at least 20 seconds. If soap and water are not available, clean your hands with an alcohol-based hand sanitizer that contains at least 60% alcohol. Clean your hands often.  Wash your hands often with soap and water for at least 20 seconds. This is especially important after blowing your nose, coughing, or sneezing; going to the bathroom; and before eating or preparing food.  Use hand sanitizer if soap and water are not available. Use an alcohol-based hand sanitizer with at least 60% alcohol, covering all surfaces of your hands and rubbing them together until they feel dry.  Soap and water are the best option, especially if your hands are visibly dirty.  Avoid touching your eyes, nose, and mouth with unwashed hands. Avoid sharing personal household items.  Do not share dishes, drinking glasses, cups, eating utensils, towels, or bedding with other people in your home.  Wash these items thoroughly after using them with soap and water or put them in the dishwasher. Clean all "high-touch" surfaces everyday.  Clean and disinfect high-touch surfaces in your "sick room" and bathroom. Let someone else clean and disinfect surfaces in common areas, but not your bedroom and bathroom.  If a caregiver or other person needs to clean and disinfect a sick person's bedroom or bathroom, they should do so on an as-needed basis. The caregiver/other person should wear a mask and wait as long as possible after the sick person has used the bathroom. High-touch surfaces include phones, remote controls, counters, tabletops, doorknobs, bathroom fixtures, toilets, keyboards, tablets, and  bedside tables.  Clean and disinfect areas that may have blood, stool, or body fluids on them.  Use household cleaners and disinfectants. Clean the area or item with soap and water or another detergent if it is dirty. Then use a household disinfectant. ? Be sure to follow the instructions on the label to ensure safe and effective use of the product. Many products recommend keeping the surface wet for several minutes to ensure germs are killed. Many also recommend precautions such as wearing gloves and making sure you have good ventilation during use of the product. ? Most EPA-registered household disinfectants  should be effective. How to discontinue home isolation  People with COVID-19 who have stayed home (home isolated) can stop home isolation under the following conditions: ? If you will not have a test to determine if you are still contagious, you can leave home after these three things have happened:  You have had no fever for at least 72 hours (that is three full days of no fever without the use of medicine that reduces fevers) AND  other symptoms have improved (for example, when your cough or shortness of breath has improved) AND  at least 10 days have passed since your symptoms first appeared. ? If you will be tested to determine if you are still contagious, you can leave home after these three things have happened:  You no longer have a fever (without the use of medicine that reduces fevers) AND  other symptoms have improved (for example, when your cough or shortness of breath has improved) AND  you received two negative tests in a row, 24 hours apart. Your doctor will follow CDC guidelines. In all cases, follow the guidance of your healthcare provider and local health department. The decision to stop home isolation should be made in consultation with your healthcare provider and state and local health departments. Local decisions depend on local  circumstances. [place]Flowers.co.uk 05/05/2018 This information is not intended to replace advice given to you by your health care provider. Make sure you discuss any questions you have with your health care provider. Document Released: 04/16/2018 Document Revised: 05/15/2018 Document Reviewed: 04/16/2018 Elsevier Patient Education  2020 [employer].

## 2018-12-23 NOTE — Discharge Summary (Signed)
Physician Discharge Summary  Lucas White OKH:[id number] DOB: December 23, 1960 DOA: 12/19/2018  PCP: Windy Fast, MD  Admit date: 12/19/2018 Discharge date: 12/23/2018  Admitted From: Home Disposition: Home  Recommendations for Outpatient Follow-up:  1. Follow up with PCP in 1-2 weeks 2. Continue Decadron for 5 additional days  Home Health: None Equipment/Devices: None  Discharge Condition: Stable CODE STATUS: Full code Diet recommendation: Diabetic diet  HPI: Per admitting MD, Lucas White is a 58 y.o. male with medical history significant of chronic back pain, obesity, type 2 diabetes currently, COVID-19 +6 days ago who is off he is regular medications and insulin returning today to the emergency department due to progressively worse dyspnea and weakness.  EMS on arrival found his vital signs to be BP 100/70, HR 97, RR 24, SPO2 74% on RA and CBG of 533.  He did not provide much history, but states that his does not feel good.  He does not know that he has had a fever, chills or night sweats.  Currently, he denies headache, chest pain, abdominal pain, diarrhea, nausea or urinary symptoms.  However, he continues to be unable to elaborate further on his symptoms.  Per Dr. Gareth Morgan note on 12/13/2018, the patient was acting similarly.  I spoke to the patient's wife who stated that this has been a chronic issue and he used to be on anxiolytics. ED Course: Initial vital signs temperature 98.7 F, pulse 94, respirations 27, blood pressure 119/70 mmHg and O2 sat 94% on room air.  He received 1000 mg of acetaminophen p.o. in the ED.  I added a 1000 mL 0.45% sodium chloride bolus, NovoLog 20 units SQ, ceftriaxone/Zithromax IVPB and Solu-Medrol 125 mg. He is urinalysis showed glucosuria more than 500 mg/dL, moderate hemoglobinuria, proteinuria of 100 mg/dL.  Microscopic semination showed rare bacteria.  Venous blood gas showed a PO2 of less than 35mHg, the rest of the measurements were normal.   His white count was 7.2, hemoglobin 11.3 g/dL and platelets 289.  Lactic acid was 2.4 twice.  Sodium 131, potassium 4.5, chloride 89 and CO2 27 mmol/L.  BUN 30, creatinine 1.51 and glucose 471.  (Most recent creatinine was 1.20).  Total protein 7.4, albumin 3.0 g/dL.  AST slightly elevated at 53, but normal ALT, alk phos and total bilirubin.  LDH was 377, triglycerides 171 CRP 16.8, ferritin 430, D-dimer 0.95 and fibrinogen 768.  Hospital Course / Discharge diagnoses: Acute hypoxic respiratory failure due to COVID-19 viral pneumonia-patient was admitted to the hospital with hypoxia requiring supplemental oxygen, he was started on remdesivir and completed 5 days while hospitalized.  He was also started on steroids.  He is respiratory status improved, he was able to be weaned off to room air, and he is inflammatory markers have improved significantly.  He has returned back to baseline and will be discharged home in stable condition. COVID-19 Labs  Recent Labs    12/21/18 0233 12/22/18 0243 12/23/18 0250  DDIMER 1.16* 0.81* 0.70*  FERRITIN 347* 262 239  CRP 4.9* 2.0* 1.0*   Active Problems Acute toxic metabolic encephalopathy with confusion and agitation -Possibly due to longstanding opiates as well as COVID-19, improved with treatment, negative discharge she is alert and oriented x4.  Ammonia and B12 are normal, TSH low, free T4 mildly elevated, will need to be repeated as an outpatient.  RPR was falsely positive as treponema antigen was negative. Acute kidney injury -Resolved, likely prerenal Uncontrolled type 2 diabetes mellitus with hyperglycemia -continue home medications  Chronic pain-continue home medications however I am quite concerned about the amount of narcotics that patient is at home, this will be addressed as an outpatient  Discharge Instructions   Allergies as of 12/23/2018   No Known Allergies     Medication List    TAKE these medications   cloNIDine 0.1 MG  tablet Commonly known as: CATAPRES Take 0.1 mg by mouth 3 (three) times daily.   dexamethasone 6 MG tablet Commonly known as: DECADRON Take 1 tablet (6 mg total) by mouth daily.   diltiazem 180 MG 24 hr capsule Commonly known as: TIAZAC Take 180 mg by mouth daily.   escitalopram 20 MG tablet Commonly known as: LEXAPRO Take 20 mg by mouth daily.   gabapentin 600 MG tablet Commonly known as: NEURONTIN Take 600 mg by mouth 3 (three) times daily.   glipiZIDE 10 MG tablet Commonly known as: GLUCOTROL Take 10 mg by mouth daily before breakfast.   guaiFENesin-dextromethorphan 100-10 MG/5ML syrup Commonly known as: ROBITUSSIN DM Take 10 mLs by mouth every 4 (four) hours as needed for cough.   HYDROcodone-acetaminophen 5-325 MG tablet Commonly known as: NORCO/VICODIN Take 1 tablet by mouth 3 (three) times daily.   hydrocortisone 2.5 % rectal cream Commonly known as: Anusol-HC Place 1 application rectally 2 (two) times daily.   insulin glargine 100 UNIT/ML injection Commonly known as: LANTUS Inject 30 Units into the skin at bedtime.   metFORMIN 500 MG tablet Commonly known as: GLUCOPHAGE Take 500 mg by mouth 2 (two) times daily with a meal.   Oxycodone HCl 20 MG Tabs Take 1 tablet by mouth every 6 (six) hours as needed (pain).   pravastatin 40 MG tablet Commonly known as: PRAVACHOL Take 40 mg by mouth daily.   sildenafil 100 MG tablet Commonly known as: VIAGRA Take 100 mg by mouth daily as needed for erectile dysfunction.      Follow-up Information    Windy Fast, MD Follow up in 2 week(s).   Specialty: Internal Medicine Contact information: [place] [address]           Consultations:  None   Procedures/Studies:  CT Head Wo Contrast  Result Date: 12/13/2018 CLINICAL DATA:  COVID-19 positive. Headaches. EXAM: CT HEAD WITHOUT CONTRAST TECHNIQUE: Contiguous axial images were obtained from the base of the skull through the  vertex without intravenous contrast. COMPARISON:  None. FINDINGS: Brain: No evidence of acute infarction, hemorrhage, hydrocephalus, extra-axial collection or mass lesion/mass effect. Vascular: No hyperdense vessel. Skull: Normal. Negative for fracture or focal lesion. Sinuses/Orbits: No acute finding. Other: None. IMPRESSION: No focal acute intracranial abnormality identified. Electronically Signed   By: Abelardo Diesel M.D.   On: 12/13/2018 13:11   CT ABDOMEN PELVIS W CONTRAST  Result Date: 12/13/2018 CLINICAL DATA:  COVID-19 positive. Generalized abdomen pain. EXAM: CT ABDOMEN AND PELVIS WITH CONTRAST TECHNIQUE: Multidetector CT imaging of the abdomen and pelvis was performed using the standard protocol following bolus administration of intravenous contrast. CONTRAST:  16m OMNIPAQUE IOHEXOL 300 MG/ML  SOLN COMPARISON:  February 16, 2008 FINDINGS: Lower chest: Mild patchy opacity is identified in bilateral lower lobes more prominently involving the right lower lobe. The heart size is normal. Hepatobiliary: There is diffuse low density of liver. No focal liver lesion is identified. The gallbladder is normal. The biliary tree is normal. Pancreas: Unremarkable. No pancreatic ductal dilatation or surrounding inflammatory changes. Spleen: Normal in size without focal abnormality. Adrenals/Urinary Tract: Left adrenal nodule is unchanged compared prior  exam. The right adrenal gland is normal. Nonobstructing stones are identified in both kidneys. No hydronephrosis is identified bilaterally. Bladder is normal. Stomach/Bowel: Stomach is within normal limits. Appendix appears normal. No evidence of bowel wall thickening, distention, or inflammatory changes. Vascular/Lymphatic: Aortic atherosclerosis. No enlarged abdominal or pelvic lymph nodes. Reproductive: Prostate is unremarkable. Other: None. Musculoskeletal: Degenerative joint changes of the spine are noted. IMPRESSION: 1. Mild patchy consolidation identified in  bilateral lower lobes, more prominently involving the right lower lobe suspicious for pneumonia. 2. No acute abnormality identified in the abdomen and pelvis. 3. Fatty infiltration of liver. 4. Nonobstructing stones in both kidneys. Electronically Signed   By: Abelardo Diesel M.D.   On: 12/13/2018 13:16   DG Chest Port 1 View  Result Date: 12/19/2018 CLINICAL DATA:  COVID-19. Shortness of breath. EXAM: PORTABLE CHEST 1 VIEW COMPARISON:  Radiograph 6 days ago 12/13/2018 FINDINGS: Progressive patchy opacities throughout both lungs from prior exam, basilar predominant. Unchanged heart size and mediastinal contours. No pleural fluid or pneumothorax. No acute osseous abnormalities are seen. IMPRESSION: Progressive patchy opacities throughout both lungs, consistent with multifocal pneumonia. Electronically Signed   By: Keith Rake M.D.   On: 12/19/2018 05:44   DG Chest Portable 1 View  Result Date: 12/13/2018 CLINICAL DATA:  Altered mental status. EXAM: PORTABLE CHEST 1 VIEW COMPARISON:  08/03/2010 FINDINGS: Normal sized heart. Clear lungs. Unremarkable bones. IMPRESSION: No acute abnormality. Electronically Signed   By: Claudie Revering M.D.   On: 12/13/2018 10:17     Subjective: - no chest pain, shortness of breath, no abdominal pain, nausea or vomiting.   Discharge Exam: BP 138/89 (BP Location: Right Arm)   Pulse 77   Temp 98.1 F (36.7 C) (Oral)   Resp 18   Ht '5\' 11"'$  (1.803 m)   Wt 89.3 kg   SpO2 94%   BMI 27.46 kg/m   General: Pt is alert, awake, not in acute distress Cardiovascular: RRR, S1/S2 +, no rubs, no gallops Respiratory: CTA bilaterally, no wheezing, no rhonchi Abdominal: Soft, NT, ND, bowel sounds + Extremities: no edema, no cyanosis   The results of significant diagnostics from this hospitalization (including imaging, microbiology, ancillary and laboratory) are listed below for reference.     Microbiology: Recent Results (from the past 240 hour(s))  Blood culture  (routine x 2)     Status: None   Collection Time: 12/13/18 10:04 AM   Specimen: BLOOD  Result Value Ref Range Status   Specimen Description   Final    BLOOD RIGHT ANTECUBITAL Performed at [facility] [address], [place], [place] [address]    Special Requests   Final    BOTTLES DRAWN AEROBIC AND ANAEROBIC Blood Culture adequate volume Performed at [facility] [address], [place], [place] [address]    Culture   Final    NO GROWTH 5 DAYS Performed at [facility], [place] [address], [place], [name] [address]    Report Status 12/18/2018 FINAL  Final  Blood culture (routine x 2)     Status: None   Collection Time: 12/13/18 10:33 AM   Specimen: BLOOD  Result Value Ref Range Status   Specimen Description   Final    BLOOD RIGHT ARM Performed at [facility] [address] [place], [place] [address]    Special Requests   Final    BOTTLES DRAWN AEROBIC AND ANAEROBIC Blood Culture results may not be optimal due to an excessive  volume of blood received in culture bottles Performed at [facility] [address], [place], [place] [address]    Culture   Final    NO GROWTH 5 DAYS Performed at [facility], [place] [address] [place], [place] [address]    Report Status 12/18/2018 FINAL  Final  Blood Culture (routine x 2)     Status: None (Preliminary result)   Collection Time: 12/19/18  5:43 AM   Specimen: BLOOD  Result Value Ref Range Status   Specimen Description   Final    BLOOD SITE NOT SPECIFIED Performed at [facility] [address], [place], [place] [address]    Special Requests   Final    BOTTLES DRAWN AEROBIC AND ANAEROBIC Blood Culture adequate volume Performed at [facility] [address], [place], [place] [address]    Culture   Final    NO GROWTH 4 DAYS Performed at [facility], [place] [address] [place], [place] [address]     Report Status PENDING  Incomplete  Blood Culture (routine x 2)     Status: None (Preliminary result)   Collection Time: 12/19/18  5:55 AM   Specimen: BLOOD  Result Value Ref Range Status   Specimen Description   Final    BLOOD RIGHT ANTECUBITAL Performed at [facility] [address] [place], [place] [address]    Special Requests   Final    BOTTLES DRAWN AEROBIC AND ANAEROBIC Blood Culture adequate volume Performed at [facility] [address], [place], [place] [address]    Culture   Final    NO GROWTH 4 DAYS Performed at [facility], [place] [address], [place], [place] [address]    Report Status PENDING  Incomplete  Culture, sputum-assessment     Status: None   Collection Time: 12/19/18 10:53 AM   Specimen: Sputum  Result Value Ref Range Status   Specimen Description SPUTUM  Final   Special Requests Immunocompromised  Final   Sputum evaluation   Final    THIS SPECIMEN IS ACCEPTABLE FOR SPUTUM CULTURE Performed at [facility], [place] [address] [place], [place] [address]    Report Status 12/19/2018 FINAL  Final  Culture, respiratory     Status: None   Collection Time: 12/19/18 10:53 AM   Specimen: SPU  Result Value Ref Range Status   Specimen Description   Final    SPUTUM Performed at [facility] [address] [place], [place] [address]    Special Requests   Final    Immunocompromised Reflexed from [id number] Performed at [facility], [place] [address], [place], [place] [address]    Gram Stain   Final    RARE WBC PRESENT, PREDOMINANTLY PMN MODERATE GRAM NEGATIVE RODS FEW GRAM POSITIVE COCCI IN PAIRS IN CHAINS RARE GRAM POSITIVE RODS RARE YEAST    Culture   Final    FEW STAPHYLOCOCCUS AUREUS MODERATE HAEMOPHILUS INFLUENZAE BETA LACTAMASE NEGATIVE Performed at [facility], [place] [address] [place] [address], [place],  [address]    Report Status 12/22/2018 FINAL  Final    Organism ID, Bacteria STAPHYLOCOCCUS AUREUS  Final      Susceptibility   Staphylococcus aureus - MIC*    CIPROFLOXACIN <=0.5 SENSITIVE Sensitive     ERYTHROMYCIN <=0.25 SENSITIVE Sensitive     GENTAMICIN <=0.5 SENSITIVE Sensitive     OXACILLIN 0.5 SENSITIVE Sensitive     TETRACYCLINE <=  1 SENSITIVE Sensitive     VANCOMYCIN 1 SENSITIVE Sensitive     TRIMETH/SULFA <=10 SENSITIVE Sensitive     CLINDAMYCIN <=0.25 SENSITIVE Sensitive     RIFAMPIN <=0.5 SENSITIVE Sensitive     Inducible Clindamycin NEGATIVE Sensitive     * FEW STAPHYLOCOCCUS AUREUS     Labs: Basic Metabolic Panel: Recent Labs  Lab 12/19/18 0654 12/20/18 0325 12/21/18 0233 12/22/18 0243 12/23/18 0250  NA 131* 137 139 140 139  K 4.5 3.9 3.7 3.8 4.2  CL 89* 97* 101 102 103  CO2 '27 28 28 27 27  '$ GLUCOSE 471* 225* 196* 157* 231*  BUN 30* 25* 21* 17 19  CREATININE 1.51* 0.93 0.82 0.76 0.77  CALCIUM 7.9* 8.7* 8.5* 8.7* 8.6*  MG 2.9* 2.8* 2.4 2.2 2.3  PHOS 3.8 2.7 3.0 3.6 3.9   Liver Function Tests: Recent Labs  Lab 12/19/18 0654 12/20/18 0325 12/21/18 0233 12/22/18 0243 12/23/18 0250  AST 53* 54* 32 24 19  ALT 42 50* 39 37 34  ALKPHOS 82 86 71 74 68  BILITOT 0.8 0.4 0.8 0.6 0.8  PROT 7.4 7.8 6.7 6.7 6.5  ALBUMIN 3.0* 3.1* 2.8* 2.8* 3.0*   CBC: Recent Labs  Lab 12/19/18 0519 12/20/18 0325 12/21/18 0233 12/22/18 0243 12/23/18 0250  WBC 7.2 5.1 5.9 6.7 7.0  NEUTROABS 5.7 4.2 5.0 5.6 6.0  HGB 11.3* 11.8* 11.0* 11.4* 11.7*  HCT 33.2* 36.0* 33.8* 34.8* 36.0*  MCV 86.5 86.7 86.4 87.0 87.2  PLT 289 429* 497* 541* 615*   CBG: Recent Labs  Lab 12/22/18 0734 12/22/18 1119 12/22/18 1610 12/22/18 2245 12/23/18 0815  GLUCAP 155* 189* 260* 264* 189*   Hgb A1c No results for input(s): HGBA1C in the last 72 hours. Lipid Profile No results for input(s): CHOL, HDL, LDLCALC, TRIG, CHOLHDL, LDLDIRECT in the last 72 hours. Thyroid function studies Recent Labs    12/20/18 0933  TSH 0.051*    Urinalysis    Component Value Date/Time   COLORURINE YELLOW 12/19/2018 0721   APPEARANCEUR CLEAR 12/19/2018 0721   LABSPEC 1.027 12/19/2018 0721   PHURINE 5.0 12/19/2018 0721   GLUCOSEU >=500 (A) 12/19/2018 0721   HGBUR MODERATE (A) 12/19/2018 0721   BILIRUBINUR NEGATIVE 12/19/2018 0721   KETONESUR NEGATIVE 12/19/2018 0721   PROTEINUR 100 (A) 12/19/2018 0721   NITRITE NEGATIVE 12/19/2018 0721   LEUKOCYTESUR NEGATIVE 12/19/2018 0721    FURTHER DISCHARGE INSTRUCTIONS:   Get Medicines reviewed and adjusted: Please take all your medications with you for your next visit with your Primary MD   Laboratory/radiological data: Please request your Primary MD to go over all hospital tests and procedure/radiological results at the follow up, please ask your Primary MD to get all Hospital records sent to his/her office.   In some cases, they will be blood work, cultures and biopsy results pending at the time of your discharge. Please request that your primary care M.D. goes through all the records of your hospital data and follows up on these results.   Also Note the following: If you experience worsening of your admission symptoms, develop shortness of breath, life threatening emergency, suicidal or homicidal thoughts you must seek medical attention immediately by calling 911 or calling your MD immediately  if symptoms less severe.   You must read complete instructions/literature along with all the possible adverse reactions/side effects for all the Medicines you take and that have been prescribed to you. Take any new Medicines after you have completely understood and accpet  all the possible adverse reactions/side effects.    Do not drive when taking Pain medications or sleeping medications (Benzodaizepines)   Do not take more than prescribed Pain, Sleep and Anxiety Medications. It is not advisable to combine anxiety,sleep and pain medications without talking with your primary care  practitioner   Special Instructions: If you have smoked or chewed Tobacco  in the last 2 yrs please stop smoking, stop any regular Alcohol  and or any Recreational drug use.   Wear Seat belts while driving.   Please note: You were cared for by a hospitalist during your hospital stay. Once you are discharged, your primary care physician will handle any further medical issues. Please note that NO REFILLS for any discharge medications will be authorized once you are discharged, as it is imperative that you return to your primary care physician (or establish a relationship with a primary care physician if you do not have one) for your post hospital discharge needs so that they can reassess your need for medications and monitor your lab values.  Time coordinating discharge: 40 minutes  SIGNED:  Marzetta Board, MD, PhD 12/23/2018, 9:10 AM

## 2018-12-23 NOTE — Progress Notes (Signed)
Pt discharged to home, instructions reviewed with wife and patients. Questions addressed. Acknowledged understanding. SRP, RN

## 2018-12-23 NOTE — Plan of Care (Signed)
  Problem: Health Behavior/Discharge Planning: Goal: Ability to manage health-related needs will improve 12/23/2018 1258 by Zadie Rhine, RN Outcome: Adequate for Discharge 12/23/2018 1257 by Zadie Rhine, RN Outcome: Progressing   Problem: Clinical Measurements: Goal: Ability to maintain clinical measurements within normal limits will improve 12/23/2018 1258 by Zadie Rhine, RN Outcome: Adequate for Discharge 12/23/2018 1257 by Zadie Rhine, RN Outcome: Progressing Goal: Will remain free from infection 12/23/2018 1258 by Zadie Rhine, RN Outcome: Adequate for Discharge 12/23/2018 1257 by Zadie Rhine, RN Outcome: Progressing Goal: Diagnostic test results will improve Outcome: Completed/Met Goal: Respiratory complications will improve 12/23/2018 1258 by Zadie Rhine, RN Outcome: Adequate for Discharge 12/23/2018 1257 by Zadie Rhine, RN Outcome: Progressing Goal: Cardiovascular complication will be avoided 12/23/2018 1258 by Zadie Rhine, RN Outcome: Adequate for Discharge 12/23/2018 1257 by Zadie Rhine, RN Outcome: Progressing

## 2018-12-24 LAB — CULTURE, BLOOD (ROUTINE X 2)
Culture: NO GROWTH
Culture: NO GROWTH
Special Requests: ADEQUATE
Special Requests: ADEQUATE

## 2018-12-25 LAB — VITAMIN B1: Vitamin B1 (Thiamine): 82.5 nmol/L (ref 66.5–200.0)

## 2019-08-01 ENCOUNTER — Other Ambulatory Visit: Payer: Self-pay

## 2019-08-01 ENCOUNTER — Emergency Department (HOSPITAL_COMMUNITY)
Admission: EM | Admit: 2019-08-01 | Discharge: 2019-08-02 | Disposition: A | Discharge status: Home / Self Care | Payer: Medicaid Other | Attending: Emergency Medicine | Admitting: Emergency Medicine

## 2019-08-01 ENCOUNTER — Encounter (HOSPITAL_COMMUNITY): Payer: Self-pay | Admitting: Emergency Medicine

## 2019-08-01 DIAGNOSIS — G8929 Other chronic pain: Secondary | ICD-10-CM | POA: Insufficient documentation

## 2019-08-01 DIAGNOSIS — Z760 Encounter for issue of repeat prescription: Secondary | ICD-10-CM | POA: Insufficient documentation

## 2019-08-01 DIAGNOSIS — M549 Dorsalgia, unspecified: Secondary | ICD-10-CM | POA: Insufficient documentation

## 2019-08-01 DIAGNOSIS — Z5321 Procedure and treatment not carried out due to patient leaving prior to being seen by health care provider: Secondary | ICD-10-CM | POA: Insufficient documentation

## 2019-08-01 NOTE — ED Triage Notes (Addendum)
Per EMS-ran out of oxycodone yesterday-cant see pain management doctor till Wednesday-chronic back pain-CBG 344, patient told EMS he was "tired of taking meds for his DM"-noncompliant

## 2019-08-02 NOTE — ED Notes (Signed)
Pt eloped from waiting area. Called 3X.  

## 2020-02-17 ENCOUNTER — Other Ambulatory Visit: Payer: Self-pay

## 2020-02-17 ENCOUNTER — Encounter (HOSPITAL_COMMUNITY): Payer: Self-pay

## 2020-02-17 ENCOUNTER — Inpatient Hospital Stay (HOSPITAL_COMMUNITY)
Admission: EM | Admit: 2020-02-17 | Discharge: 2020-02-23 | DRG: 896 | Disposition: A | Discharge status: Home Health | Payer: Medicare Other | Attending: Student | Admitting: Student

## 2020-02-17 ENCOUNTER — Emergency Department (HOSPITAL_COMMUNITY): Payer: Medicare Other

## 2020-02-17 DIAGNOSIS — I10 Essential (primary) hypertension: Secondary | ICD-10-CM | POA: Diagnosis present

## 2020-02-17 DIAGNOSIS — R410 Disorientation, unspecified: Secondary | ICD-10-CM | POA: Diagnosis not present

## 2020-02-17 DIAGNOSIS — Z6827 Body mass index (BMI) 27.0-27.9, adult: Secondary | ICD-10-CM

## 2020-02-17 DIAGNOSIS — F1020 Alcohol dependence, uncomplicated: Secondary | ICD-10-CM | POA: Diagnosis present

## 2020-02-17 DIAGNOSIS — G9341 Metabolic encephalopathy: Secondary | ICD-10-CM | POA: Diagnosis present

## 2020-02-17 DIAGNOSIS — F19231 Other psychoactive substance dependence with withdrawal delirium: Principal | ICD-10-CM | POA: Diagnosis present

## 2020-02-17 DIAGNOSIS — G894 Chronic pain syndrome: Secondary | ICD-10-CM | POA: Diagnosis present

## 2020-02-17 DIAGNOSIS — Z833 Family history of diabetes mellitus: Secondary | ICD-10-CM

## 2020-02-17 DIAGNOSIS — Z79899 Other long term (current) drug therapy: Secondary | ICD-10-CM

## 2020-02-17 DIAGNOSIS — Y929 Unspecified place or not applicable: Secondary | ICD-10-CM

## 2020-02-17 DIAGNOSIS — E876 Hypokalemia: Secondary | ICD-10-CM | POA: Diagnosis present

## 2020-02-17 DIAGNOSIS — Z794 Long term (current) use of insulin: Secondary | ICD-10-CM

## 2020-02-17 DIAGNOSIS — R739 Hyperglycemia, unspecified: Secondary | ICD-10-CM

## 2020-02-17 DIAGNOSIS — G312 Degeneration of nervous system due to alcohol: Secondary | ICD-10-CM | POA: Diagnosis present

## 2020-02-17 DIAGNOSIS — Z781 Physical restraint status: Secondary | ICD-10-CM

## 2020-02-17 DIAGNOSIS — E669 Obesity, unspecified: Secondary | ICD-10-CM | POA: Diagnosis present

## 2020-02-17 DIAGNOSIS — E1165 Type 2 diabetes mellitus with hyperglycemia: Secondary | ICD-10-CM

## 2020-02-17 DIAGNOSIS — Z20822 Contact with and (suspected) exposure to covid-19: Secondary | ICD-10-CM | POA: Diagnosis present

## 2020-02-17 DIAGNOSIS — Z8616 Personal history of COVID-19: Secondary | ICD-10-CM

## 2020-02-17 DIAGNOSIS — Y900 Blood alcohol level of less than 20 mg/100 ml: Secondary | ICD-10-CM | POA: Diagnosis present

## 2020-02-17 DIAGNOSIS — F419 Anxiety disorder, unspecified: Secondary | ICD-10-CM | POA: Diagnosis present

## 2020-02-17 DIAGNOSIS — D649 Anemia, unspecified: Secondary | ICD-10-CM | POA: Diagnosis present

## 2020-02-17 DIAGNOSIS — T426X5A Adverse effect of other antiepileptic and sedative-hypnotic drugs, initial encounter: Secondary | ICD-10-CM | POA: Diagnosis present

## 2020-02-17 DIAGNOSIS — E1140 Type 2 diabetes mellitus with diabetic neuropathy, unspecified: Secondary | ICD-10-CM | POA: Diagnosis present

## 2020-02-17 DIAGNOSIS — F32A Depression, unspecified: Secondary | ICD-10-CM | POA: Diagnosis present

## 2020-02-17 DIAGNOSIS — Z9114 Patient's other noncompliance with medication regimen: Secondary | ICD-10-CM

## 2020-02-17 DIAGNOSIS — G928 Other toxic encephalopathy: Secondary | ICD-10-CM | POA: Diagnosis present

## 2020-02-17 LAB — URINALYSIS, ROUTINE W REFLEX MICROSCOPIC
Bacteria, UA: NONE SEEN
Bilirubin Urine: NEGATIVE
Glucose, UA: >=500 mg/dL — AB
Hgb urine dipstick: NEGATIVE
Ketones, ur: NEGATIVE mg/dL
Leukocytes,Ua: NEGATIVE
Nitrite: NEGATIVE
Protein, ur: NEGATIVE mg/dL
Specific Gravity, Urine: 1.025 (ref 1.005–1.030)
pH: 7 (ref 5.0–8.0)

## 2020-02-17 LAB — CBC WITH DIFFERENTIAL/PLATELET
Abs Immature Granulocytes: 0.04 10*3/uL (ref 0.00–0.07)
Basophils Absolute: 0 10*3/uL (ref 0.0–0.1)
Basophils Relative: 0 %
Eosinophils Absolute: 0.1 10*3/uL (ref 0.0–0.5)
Eosinophils Relative: 1 %
HCT: 36.8 % — ABNORMAL LOW (ref 39.0–52.0)
Hemoglobin: 12.4 g/dL — ABNORMAL LOW (ref 13.0–17.0)
Immature Granulocytes: 0 %
Lymphocytes Relative: 27 %
Lymphs Abs: 2.7 10*3/uL (ref 0.7–4.0)
MCH: 29.3 pg (ref 26.0–34.0)
MCHC: 33.7 g/dL (ref 30.0–36.0)
MCV: 87 fL (ref 80.0–100.0)
Monocytes Absolute: 0.5 10*3/uL (ref 0.1–1.0)
Monocytes Relative: 5 %
Neutro Abs: 6.7 10*3/uL (ref 1.7–7.7)
Neutrophils Relative %: 67 %
Platelets: 410 10*3/uL — ABNORMAL HIGH (ref 150–400)
RBC: 4.23 MIL/uL (ref 4.22–5.81)
RDW: 11.9 % (ref 11.5–15.5)
WBC: 10 10*3/uL (ref 4.0–10.5)
nRBC: 0 % (ref 0.0–0.2)

## 2020-02-17 LAB — COMPREHENSIVE METABOLIC PANEL
ALT: 18 U/L (ref 0–44)
AST: 22 U/L (ref 15–41)
Albumin: 4 g/dL (ref 3.5–5.0)
Alkaline Phosphatase: 111 U/L (ref 38–126)
Anion gap: 11 (ref 5–15)
BUN: 7 mg/dL (ref 6–20)
CO2: 24 mmol/L (ref 22–32)
Calcium: 9.1 mg/dL (ref 8.9–10.3)
Chloride: 101 mmol/L (ref 98–111)
Creatinine, Ser: 0.9 mg/dL (ref 0.61–1.24)
GFR, Estimated: >60 mL/min (ref >60)
Glucose, Bld: 414 mg/dL — ABNORMAL HIGH (ref 70–99)
Potassium: 3.3 mmol/L — ABNORMAL LOW (ref 3.5–5.1)
Sodium: 136 mmol/L (ref 135–145)
Total Bilirubin: 0.5 mg/dL (ref 0.3–1.2)
Total Protein: 7.5 g/dL (ref 6.5–8.1)

## 2020-02-17 LAB — ETHANOL: Alcohol, Ethyl (B): <10 mg/dL (ref <10)

## 2020-02-17 LAB — LIPASE, BLOOD: Lipase: 40 U/L (ref 11–51)

## 2020-02-17 LAB — BLOOD GAS, VENOUS
Acid-Base Excess: 1.6 mmol/L (ref 0.0–2.0)
Bicarbonate: 24.3 mmol/L (ref 20.0–28.0)
O2 Saturation: 81.4 %
Patient temperature: 98.6
pCO2, Ven: 33.4 mmHg — ABNORMAL LOW (ref 44.0–60.0)
pH, Ven: 7.475 — ABNORMAL HIGH (ref 7.250–7.430)
pO2, Ven: 42.8 mmHg (ref 32.0–45.0)

## 2020-02-17 LAB — I-STAT CHEM 8, ED
BUN: 4 mg/dL — ABNORMAL LOW (ref 6–20)
Calcium, Ion: 1.18 mmol/L (ref 1.15–1.40)
Chloride: 101 mmol/L (ref 98–111)
Creatinine, Ser: 0.7 mg/dL (ref 0.61–1.24)
Glucose, Bld: 414 mg/dL — ABNORMAL HIGH (ref 70–99)
HCT: 38 % — ABNORMAL LOW (ref 39.0–52.0)
Hemoglobin: 12.9 g/dL — ABNORMAL LOW (ref 13.0–17.0)
Potassium: 3.4 mmol/L — ABNORMAL LOW (ref 3.5–5.1)
Sodium: 138 mmol/L (ref 135–145)
TCO2: 23 mmol/L (ref 22–32)

## 2020-02-17 LAB — LACTIC ACID, PLASMA: Lactic Acid, Venous: 2.2 mmol/L (ref 0.5–1.9)

## 2020-02-17 LAB — CBG MONITORING, ED: Glucose-Capillary: 433 mg/dL — ABNORMAL HIGH (ref 70–99)

## 2020-02-17 MED ORDER — DROPERIDOL 2.5 MG/ML IJ SOLN
2.5000 mg | Freq: Once | INTRAMUSCULAR | Status: AC
  Administered 2020-02-17: 2.5 mg via INTRAVENOUS
  Filled 2020-02-17: qty 2

## 2020-02-17 MED ORDER — LACTATED RINGERS IV BOLUS
1000.0000 mL | Freq: Once | INTRAVENOUS | Status: AC
  Administered 2020-02-17: 1000 mL via INTRAVENOUS

## 2020-02-17 MED ORDER — LORAZEPAM 2 MG/ML IJ SOLN
0.5000 mg | Freq: Once | INTRAMUSCULAR | Status: AC
  Administered 2020-02-17: 0.5 mg via INTRAVENOUS
  Filled 2020-02-17: qty 1

## 2020-02-17 NOTE — ED Triage Notes (Signed)
Pt to ED by EMS from home with c/o hyperglycemia. Pt is an insulin dependent diabetic that has not been compliant over the past few days (as reported by his wife). Pt's CBG was over 600 this morning, and wife reports continued intake of sweet treats. Pt has been increasingly lethargic and confused over the past couple of days. Sugar reads "high" with EMS, all other VSS.

## 2020-02-17 NOTE — ED Provider Notes (Signed)
[facility]-EMERGENCY DEPT Provider Note   CSN: [id number] Arrival date & time: 02/17/20  2106     History Chief Complaint  Patient presents with  . Hyperglycemia    Lucas White is a 60 y.o. male.  HPI Patient presents from home via EMS with concern for hyperglycemia, altered mental status.  Patient has diabetes, uses insulin and Metformin, and per EMS family reports the patient has not been taking his medication regularly.  Now over the past few days, patient has had increasing confusion.  The patient self seemingly denies pain, denies complaints, but answers questions inconsistently, is oriented roughly to place, not to time. EMS reports the patient's glucose at home was greater than 600, with concern for altered mental status he is brought for evaluation. Level 5 caveat secondary to acute of condition.    Past Medical History:  Diagnosis Date  . Chronic back pain greater than 3 months duration   . Obesity (BMI 30.0-34.9)   . Type 2 diabetes mellitus [facility])     Patient Active Problem List   Diagnosis Date Noted  . Pneumonia due to COVID-19 virus 12/19/2018  . Volume depletion 12/19/2018  . Confusion and disorientation 12/19/2018  . Type 2 diabetes mellitus (HCC)   . Chronic back pain greater than 3 months duration   . Obesity (BMI 30.0-34.9)     Past Surgical History:  Procedure Laterality Date  . BACK SURGERY    . COLONOSCOPY         Family History  Problem Relation Age of Onset  . Diabetes Father     Social History   Tobacco Use  . Smoking status: Never Smoker  . Smokeless tobacco: Never Used  Vaping Use  . Vaping Use: Never used  Substance Use Topics  . Alcohol use: No  . Drug use: Never    Home Medications Prior to Admission medications   Medication Sig Start Date End Date Taking? Authorizing Provider  cloNIDine (CATAPRES) 0.1 MG tablet Take 0.1 mg by mouth 3 (three) times daily.    [provider]  dexamethasone  (DECADRON) 6 MG tablet Take 1 tablet (6 mg total) by mouth daily. 12/23/18   Leatha Gilding, MD  diltiazem (TIAZAC) 180 MG 24 hr capsule Take 180 mg by mouth daily.    [provider]  escitalopram (LEXAPRO) 20 MG tablet Take 20 mg by mouth daily.    [provider]  gabapentin (NEURONTIN) 600 MG tablet Take 600 mg by mouth 3 (three) times daily.    [provider]  glipiZIDE (GLUCOTROL) 10 MG tablet Take 10 mg by mouth daily before breakfast.    [provider]  guaiFENesin-dextromethorphan (ROBITUSSIN DM) 100-10 MG/5ML syrup Take 10 mLs by mouth every 4 (four) hours as needed for cough. 12/23/18   Leatha Gilding, MD  HYDROcodone-acetaminophen (NORCO/VICODIN) 5-325 MG tablet Take 1 tablet by mouth 3 (three) times daily. 11/11/18   [provider]  hydrocortisone (ANUSOL-HC) 2.5 % rectal cream Place 1 application rectally 2 (two) times daily. Patient not taking: Reported on 12/21/2018 09/11/16   Emi Holes, PA-C  insulin glargine (LANTUS) 100 UNIT/ML injection Inject 30 Units into the skin at bedtime.    [provider]  metFORMIN (GLUCOPHAGE) 500 MG tablet Take 500 mg by mouth 2 (two) times daily with a meal.    [provider]  Oxycodone HCl 20 MG TABS Take 1 tablet by mouth every 6 (six) hours as needed (pain).  [provider]  pravastatin (PRAVACHOL) 40 MG tablet Take 40 mg by mouth daily.    [provider]  sildenafil (VIAGRA) 100 MG tablet Take 100 mg by mouth daily as needed for erectile dysfunction.    [provider]    Allergies    Patient has no known allergies.  Review of Systems   Review of Systems  Unable to perform ROS: Mental status change    Physical Exam Updated Vital Signs Ht 5\' 11"  (1.803 m)   Wt 90 kg   BMI 27.67 kg/m   Physical Exam Vitals and nursing note reviewed.  Constitutional:      General: He is not in acute distress.    Appearance: He is  well-developed.  HENT:     Head: Normocephalic and atraumatic.  Eyes:     Extraocular Movements: EOM normal.     Conjunctiva/sclera: Conjunctivae normal.  Cardiovascular:     Rate and Rhythm: Normal rate and regular rhythm.  Pulmonary:     Effort: Pulmonary effort is normal. No respiratory distress.     Breath sounds: No stridor.  Abdominal:     General: There is no distension.  Musculoskeletal:        General: No edema.  Skin:    General: Skin is warm and dry.  Neurological:     Mental Status: He is alert.     Comments: Confused, inconsistently following commands, moves all extremities spontaneously, has no facial asymmetry.  Psychiatric:        Mood and Affect: Mood and affect normal.     Comments: Confused     ED Results / Procedures / Treatments   Labs (all labs ordered are listed, but only abnormal results are displayed) Labs Reviewed  COMPREHENSIVE METABOLIC PANEL - Abnormal; Notable for the following components:      Result Value   Potassium 3.3 (*)    Glucose, Bld 414 (*)    All other components within normal limits  LACTIC ACID, PLASMA - Abnormal; Notable for the following components:   Lactic Acid, Venous 2.2 (*)    All other components within normal limits  URINALYSIS, ROUTINE W REFLEX MICROSCOPIC - Abnormal; Notable for the following components:   Color, Urine STRAW (*)    Glucose, UA >=500 (*)    All other components within normal limits  CBC WITH DIFFERENTIAL/PLATELET - Abnormal; Notable for the following components:   Hemoglobin 12.4 (*)    HCT 36.8 (*)    Platelets 410 (*)    All other components within normal limits  BLOOD GAS, VENOUS - Abnormal; Notable for the following components:   pH, Ven 7.475 (*)    pCO2, Ven 33.4 (*)    All other components within normal limits  CBG MONITORING, ED - Abnormal; Notable for the following components:   Glucose-Capillary 433 (*)    All other components within normal limits  I-STAT CHEM 8, ED - Abnormal;  Notable for the following components:   Potassium 3.4 (*)    BUN 4 (*)    Glucose, Bld 414 (*)    Hemoglobin 12.9 (*)    HCT 38.0 (*)    All other components within normal limits  ETHANOL  LIPASE, BLOOD  LACTIC ACID, PLASMA  RAPID URINE DRUG SCREEN, HOSP PERFORMED   Radiology No results found.  Procedures Procedures   Medications Ordered in ED Medications - No data to display  ED Course  I have reviewed the triage vital signs and the nursing notes.  Pertinent labs & imaging results that were available during my care of the patient were reviewed by me and considered in my medical decision making (see chart for details).  With consideration of altered mental status, hyperglycemia, patient was placed on continuous cardiac monitor, pulse oximetry, broad labs started.  Patient has received 800 mL of normal saline prior to my evaluation, will continue to receive resuscitation fluids. Cardiac monitor 70s sinus, unremarkable Pulse oximetry 98% room air normal  11:21 PM Patient has received Ativan, droperidol, due to increasing agitation, restlessness, continuously grabbing at the condom catheter. At this point vital signs are otherwise similar, but need for additional evaluation including CT scan given his persistent altered mental status, concern for hyperglycemia warranted additional sedation. Labs thus far notable for hyperglycemia without substantial anion gap, with alkalosis on blood gas suggesting DKA unlikely as sole etiology of his altered mental status. Differential including encephalopathy, metabolic, toxic, requires additional monitoring, labs, ongoing fluid resuscitation.  On signout, Dr. Read Drivers is aware of the patient. Final Clinical Impression(s) / ED Diagnoses Final diagnoses:  Delirium  Hyperglycemia     Gerhard Munch, MD 02/17/20 2324

## 2020-02-18 ENCOUNTER — Inpatient Hospital Stay (HOSPITAL_COMMUNITY)
Admit: 2020-02-18 | Discharge: 2020-02-18 | Disposition: A | Discharge status: Home / Self Care | Payer: Medicare Other | Attending: Acute Care | Admitting: Acute Care

## 2020-02-18 ENCOUNTER — Encounter (HOSPITAL_COMMUNITY): Payer: Self-pay | Admitting: Internal Medicine

## 2020-02-18 ENCOUNTER — Emergency Department (HOSPITAL_COMMUNITY): Payer: Medicare Other

## 2020-02-18 DIAGNOSIS — Y900 Blood alcohol level of less than 20 mg/100 ml: Secondary | ICD-10-CM | POA: Diagnosis present

## 2020-02-18 DIAGNOSIS — Z8616 Personal history of COVID-19: Secondary | ICD-10-CM | POA: Diagnosis not present

## 2020-02-18 DIAGNOSIS — G9341 Metabolic encephalopathy: Secondary | ICD-10-CM | POA: Diagnosis present

## 2020-02-18 DIAGNOSIS — G312 Degeneration of nervous system due to alcohol: Secondary | ICD-10-CM | POA: Diagnosis present

## 2020-02-18 DIAGNOSIS — E669 Obesity, unspecified: Secondary | ICD-10-CM | POA: Diagnosis present

## 2020-02-18 DIAGNOSIS — Z20822 Contact with and (suspected) exposure to covid-19: Secondary | ICD-10-CM | POA: Diagnosis present

## 2020-02-18 DIAGNOSIS — Z6827 Body mass index (BMI) 27.0-27.9, adult: Secondary | ICD-10-CM | POA: Diagnosis not present

## 2020-02-18 DIAGNOSIS — F32A Depression, unspecified: Secondary | ICD-10-CM | POA: Diagnosis present

## 2020-02-18 DIAGNOSIS — Z833 Family history of diabetes mellitus: Secondary | ICD-10-CM | POA: Diagnosis not present

## 2020-02-18 DIAGNOSIS — F1020 Alcohol dependence, uncomplicated: Secondary | ICD-10-CM | POA: Diagnosis present

## 2020-02-18 DIAGNOSIS — Z781 Physical restraint status: Secondary | ICD-10-CM | POA: Diagnosis not present

## 2020-02-18 DIAGNOSIS — T426X5A Adverse effect of other antiepileptic and sedative-hypnotic drugs, initial encounter: Secondary | ICD-10-CM | POA: Diagnosis present

## 2020-02-18 DIAGNOSIS — E876 Hypokalemia: Secondary | ICD-10-CM | POA: Diagnosis not present

## 2020-02-18 DIAGNOSIS — R410 Disorientation, unspecified: Secondary | ICD-10-CM | POA: Diagnosis not present

## 2020-02-18 DIAGNOSIS — F419 Anxiety disorder, unspecified: Secondary | ICD-10-CM | POA: Diagnosis present

## 2020-02-18 DIAGNOSIS — D649 Anemia, unspecified: Secondary | ICD-10-CM | POA: Diagnosis present

## 2020-02-18 DIAGNOSIS — G894 Chronic pain syndrome: Secondary | ICD-10-CM | POA: Diagnosis not present

## 2020-02-18 DIAGNOSIS — E1165 Type 2 diabetes mellitus with hyperglycemia: Secondary | ICD-10-CM | POA: Diagnosis present

## 2020-02-18 DIAGNOSIS — G928 Other toxic encephalopathy: Secondary | ICD-10-CM | POA: Diagnosis present

## 2020-02-18 DIAGNOSIS — E1140 Type 2 diabetes mellitus with diabetic neuropathy, unspecified: Secondary | ICD-10-CM | POA: Diagnosis present

## 2020-02-18 DIAGNOSIS — I10 Essential (primary) hypertension: Secondary | ICD-10-CM | POA: Diagnosis present

## 2020-02-18 DIAGNOSIS — Z794 Long term (current) use of insulin: Secondary | ICD-10-CM

## 2020-02-18 DIAGNOSIS — Z79899 Other long term (current) drug therapy: Secondary | ICD-10-CM | POA: Diagnosis not present

## 2020-02-18 DIAGNOSIS — R451 Restlessness and agitation: Secondary | ICD-10-CM | POA: Diagnosis not present

## 2020-02-18 DIAGNOSIS — Z9114 Patient's other noncompliance with medication regimen: Secondary | ICD-10-CM | POA: Diagnosis not present

## 2020-02-18 DIAGNOSIS — Y929 Unspecified place or not applicable: Secondary | ICD-10-CM | POA: Diagnosis not present

## 2020-02-18 DIAGNOSIS — F19231 Other psychoactive substance dependence with withdrawal delirium: Secondary | ICD-10-CM | POA: Diagnosis present

## 2020-02-18 LAB — CBC WITH DIFFERENTIAL/PLATELET
Abs Immature Granulocytes: 0.04 10*3/uL (ref 0.00–0.07)
Basophils Absolute: 0 10*3/uL (ref 0.0–0.1)
Basophils Relative: 0 %
Eosinophils Absolute: 0.1 10*3/uL (ref 0.0–0.5)
Eosinophils Relative: 1 %
HCT: 36.8 % — ABNORMAL LOW (ref 39.0–52.0)
Hemoglobin: 12.5 g/dL — ABNORMAL LOW (ref 13.0–17.0)
Immature Granulocytes: 0 %
Lymphocytes Relative: 29 %
Lymphs Abs: 2.8 10*3/uL (ref 0.7–4.0)
MCH: 29.6 pg (ref 26.0–34.0)
MCHC: 34 g/dL (ref 30.0–36.0)
MCV: 87.2 fL (ref 80.0–100.0)
Monocytes Absolute: 0.6 10*3/uL (ref 0.1–1.0)
Monocytes Relative: 6 %
Neutro Abs: 6 10*3/uL (ref 1.7–7.7)
Neutrophils Relative %: 64 %
Platelets: 398 10*3/uL (ref 150–400)
RBC: 4.22 MIL/uL (ref 4.22–5.81)
RDW: 12 % (ref 11.5–15.5)
WBC: 9.6 10*3/uL (ref 4.0–10.5)
nRBC: 0 % (ref 0.0–0.2)

## 2020-02-18 LAB — COMPREHENSIVE METABOLIC PANEL
ALT: 18 U/L (ref 0–44)
AST: 21 U/L (ref 15–41)
Albumin: 3.9 g/dL (ref 3.5–5.0)
Alkaline Phosphatase: 104 U/L (ref 38–126)
Anion gap: 9 (ref 5–15)
BUN: 6 mg/dL (ref 6–20)
CO2: 27 mmol/L (ref 22–32)
Calcium: 9 mg/dL (ref 8.9–10.3)
Chloride: 104 mmol/L (ref 98–111)
Creatinine, Ser: 0.81 mg/dL (ref 0.61–1.24)
GFR, Estimated: >60 mL/min (ref >60)
Glucose, Bld: 177 mg/dL — ABNORMAL HIGH (ref 70–99)
Potassium: 3.4 mmol/L — ABNORMAL LOW (ref 3.5–5.1)
Sodium: 140 mmol/L (ref 135–145)
Total Bilirubin: 0.7 mg/dL (ref 0.3–1.2)
Total Protein: 7.3 g/dL (ref 6.5–8.1)

## 2020-02-18 LAB — SEDIMENTATION RATE: Sed Rate: 24 mm/hr — ABNORMAL HIGH (ref 0–16)

## 2020-02-18 LAB — TSH: TSH: 0.319 u[IU]/mL — ABNORMAL LOW (ref 0.350–4.500)

## 2020-02-18 LAB — RAPID URINE DRUG SCREEN, HOSP PERFORMED
Amphetamines: NOT DETECTED
Barbiturates: NOT DETECTED
Benzodiazepines: POSITIVE — AB
Cocaine: NOT DETECTED
Opiates: NOT DETECTED
Tetrahydrocannabinol: NOT DETECTED

## 2020-02-18 LAB — GLUCOSE, CAPILLARY
Glucose-Capillary: 153 mg/dL — ABNORMAL HIGH (ref 70–99)
Glucose-Capillary: 129 mg/dL — ABNORMAL HIGH (ref 70–99)

## 2020-02-18 LAB — CK TOTAL AND CKMB (NOT AT ARMC)
CK, MB: 4.7 ng/mL (ref 0.5–5.0)
Relative Index: 1.6 (ref 0.0–2.5)
Total CK: 300 U/L (ref 49–397)

## 2020-02-18 LAB — HIV ANTIBODY (ROUTINE TESTING W REFLEX): HIV Screen 4th Generation wRfx: NONREACTIVE

## 2020-02-18 LAB — VITAMIN B12: Vitamin B-12: 1318 pg/mL — ABNORMAL HIGH (ref 180–914)

## 2020-02-18 LAB — CBG MONITORING, ED
Glucose-Capillary: 139 mg/dL — ABNORMAL HIGH (ref 70–99)
Glucose-Capillary: 199 mg/dL — ABNORMAL HIGH (ref 70–99)
Glucose-Capillary: 226 mg/dL — ABNORMAL HIGH (ref 70–99)

## 2020-02-18 LAB — T4, FREE: Free T4: 1.03 ng/dL (ref 0.61–1.12)

## 2020-02-18 LAB — MRSA PCR SCREENING: MRSA by PCR: NEGATIVE

## 2020-02-18 LAB — MAGNESIUM: Magnesium: 2.1 mg/dL (ref 1.7–2.4)

## 2020-02-18 LAB — SARS CORONAVIRUS 2 (TAT 6-24 HRS): SARS Coronavirus 2: NEGATIVE

## 2020-02-18 LAB — FOLATE: Folate: 7.6 ng/mL (ref >5.9)

## 2020-02-18 LAB — AMMONIA: Ammonia: 22 umol/L (ref 9–35)

## 2020-02-18 MED ORDER — SODIUM CHLORIDE 0.9 % IV SOLN: INTRAVENOUS | Status: DC

## 2020-02-18 MED ORDER — POLYETHYLENE GLYCOL 3350 17 G PO PACK: 17.0000 g | PACK | Freq: Every day | ORAL | Status: DC | PRN

## 2020-02-18 MED ORDER — STERILE WATER FOR INJECTION IJ SOLN
INTRAMUSCULAR | Status: AC
  Filled 2020-02-18: qty 10

## 2020-02-18 MED ORDER — DEXAMETHASONE 4 MG PO TABS: 6.0000 mg | ORAL_TABLET | Freq: Every day | ORAL | Status: DC

## 2020-02-18 MED ORDER — DILTIAZEM HCL ER COATED BEADS 180 MG PO CP24
180.0000 mg | ORAL_CAPSULE | Freq: Every day | ORAL | Status: DC
  Administered 2020-02-20 – 2020-02-23 (×3): 180 mg via ORAL
  Filled 2020-02-18 (×5): qty 1

## 2020-02-18 MED ORDER — ZIPRASIDONE MESYLATE 20 MG IM SOLR
20.0000 mg | Freq: Once | INTRAMUSCULAR | Status: AC
  Administered 2020-02-18: 20 mg via INTRAMUSCULAR
  Filled 2020-02-18: qty 20

## 2020-02-18 MED ORDER — HALOPERIDOL LACTATE 5 MG/ML IJ SOLN
5.0000 mg | Freq: Four times a day (QID) | INTRAMUSCULAR | Status: DC | PRN
  Administered 2020-02-18 – 2020-02-21 (×9): 5 mg via INTRAVENOUS
  Filled 2020-02-18 (×10): qty 1

## 2020-02-18 MED ORDER — INSULIN GLARGINE 100 UNIT/ML ~~LOC~~ SOLN
15.0000 [IU] | Freq: Every day | SUBCUTANEOUS | Status: DC
  Filled 2020-02-18: qty 0.15

## 2020-02-18 MED ORDER — ACETAMINOPHEN 650 MG RE SUPP: 650.0000 mg | Freq: Four times a day (QID) | RECTAL | Status: DC | PRN

## 2020-02-18 MED ORDER — DEXTROSE 50 % IV SOLN: 0.0000 mL | INTRAVENOUS | Status: DC | PRN

## 2020-02-18 MED ORDER — SODIUM CHLORIDE 0.9 % IV SOLN: INTRAVENOUS | Status: AC

## 2020-02-18 MED ORDER — LORAZEPAM 2 MG/ML IJ SOLN
2.0000 mg | INTRAMUSCULAR | Status: DC | PRN
  Administered 2020-02-18 – 2020-02-20 (×9): 2 mg via INTRAVENOUS
  Filled 2020-02-18 (×9): qty 1

## 2020-02-18 MED ORDER — LORAZEPAM 2 MG/ML IJ SOLN
2.0000 mg | Freq: Once | INTRAMUSCULAR | Status: AC
  Administered 2020-02-18: 2 mg via INTRAVENOUS
  Filled 2020-02-18: qty 1

## 2020-02-18 MED ORDER — LORAZEPAM 2 MG/ML IJ SOLN
2.0000 mg | INTRAMUSCULAR | Status: DC | PRN
  Administered 2020-02-18: 2 mg via INTRAVENOUS
  Filled 2020-02-18: qty 1

## 2020-02-18 MED ORDER — DEXTROSE IN LACTATED RINGERS 5 % IV SOLN: INTRAVENOUS | Status: DC

## 2020-02-18 MED ORDER — INSULIN ASPART 100 UNIT/ML ~~LOC~~ SOLN
0.0000 [IU] | Freq: Three times a day (TID) | SUBCUTANEOUS | Status: DC
  Administered 2020-02-18: 2 [IU] via SUBCUTANEOUS
  Administered 2020-02-18: 5 [IU] via SUBCUTANEOUS
  Administered 2020-02-18 – 2020-02-19 (×3): 3 [IU] via SUBCUTANEOUS
  Administered 2020-02-19: 2 [IU] via SUBCUTANEOUS
  Administered 2020-02-20 – 2020-02-21 (×4): 3 [IU] via SUBCUTANEOUS
  Administered 2020-02-21 (×2): 2 [IU] via SUBCUTANEOUS
  Administered 2020-02-22: 5 [IU] via SUBCUTANEOUS
  Administered 2020-02-22: 3 [IU] via SUBCUTANEOUS
  Filled 2020-02-18: qty 0.15

## 2020-02-18 MED ORDER — ONDANSETRON HCL 4 MG/2ML IJ SOLN: 4.0000 mg | Freq: Four times a day (QID) | INTRAMUSCULAR | Status: DC | PRN

## 2020-02-18 MED ORDER — GABAPENTIN 300 MG PO CAPS
600.0000 mg | ORAL_CAPSULE | Freq: Three times a day (TID) | ORAL | Status: DC
  Administered 2020-02-18 – 2020-02-23 (×13): 600 mg via ORAL
  Filled 2020-02-18 (×15): qty 2

## 2020-02-18 MED ORDER — POTASSIUM CHLORIDE CRYS ER 20 MEQ PO TBCR: 40.0000 meq | EXTENDED_RELEASE_TABLET | Freq: Once | ORAL | Status: DC

## 2020-02-18 MED ORDER — LACTATED RINGERS IV SOLN: INTRAVENOUS | Status: DC

## 2020-02-18 MED ORDER — ACETAMINOPHEN 325 MG PO TABS
650.0000 mg | ORAL_TABLET | Freq: Four times a day (QID) | ORAL | Status: DC | PRN
  Administered 2020-02-18 – 2020-02-22 (×2): 650 mg via ORAL
  Filled 2020-02-18 (×2): qty 2

## 2020-02-18 MED ORDER — CHLORHEXIDINE GLUCONATE CLOTH 2 % EX PADS
6.0000 | MEDICATED_PAD | Freq: Every day | CUTANEOUS | Status: DC
  Administered 2020-02-18 – 2020-02-23 (×5): 6 via TOPICAL

## 2020-02-18 MED ORDER — HALOPERIDOL LACTATE 5 MG/ML IJ SOLN
5.0000 mg | INTRAMUSCULAR | Status: AC
  Administered 2020-02-18: 5 mg via INTRAVENOUS
  Filled 2020-02-18: qty 1

## 2020-02-18 MED ORDER — ESCITALOPRAM OXALATE 20 MG PO TABS
20.0000 mg | ORAL_TABLET | Freq: Every day | ORAL | Status: DC
  Administered 2020-02-20 – 2020-02-23 (×3): 20 mg via ORAL
  Filled 2020-02-18 (×5): qty 1

## 2020-02-18 MED ORDER — THIAMINE HCL 100 MG/ML IJ SOLN
100.0000 mg | Freq: Every day | INTRAMUSCULAR | Status: DC
  Administered 2020-02-18 – 2020-02-20 (×3): 100 mg via INTRAVENOUS
  Filled 2020-02-18 (×3): qty 2

## 2020-02-18 MED ORDER — CHLORHEXIDINE GLUCONATE 0.12 % MT SOLN
15.0000 mL | Freq: Two times a day (BID) | OROMUCOSAL | Status: DC
  Administered 2020-02-19 – 2020-02-23 (×6): 15 mL via OROMUCOSAL
  Filled 2020-02-18 (×8): qty 15

## 2020-02-18 MED ORDER — INSULIN REGULAR(HUMAN) IN NACL 100-0.9 UT/100ML-% IV SOLN
INTRAVENOUS | Status: DC
  Filled 2020-02-18: qty 100

## 2020-02-18 MED ORDER — ONDANSETRON HCL 4 MG PO TABS: 4.0000 mg | ORAL_TABLET | Freq: Four times a day (QID) | ORAL | Status: DC | PRN

## 2020-02-18 MED ORDER — ENOXAPARIN SODIUM 40 MG/0.4ML ~~LOC~~ SOLN
40.0000 mg | SUBCUTANEOUS | Status: DC
  Administered 2020-02-18 – 2020-02-23 (×5): 40 mg via SUBCUTANEOUS
  Filled 2020-02-18 (×6): qty 0.4

## 2020-02-18 MED ORDER — ORAL CARE MOUTH RINSE
15.0000 mL | Freq: Two times a day (BID) | OROMUCOSAL | Status: DC
  Administered 2020-02-19: 15 mL via OROMUCOSAL

## 2020-02-18 MED ORDER — PRAVASTATIN SODIUM 20 MG PO TABS: 40.0000 mg | ORAL_TABLET | Freq: Every day | ORAL | Status: DC

## 2020-02-18 NOTE — Progress Notes (Signed)
EEG Completed; Results Pending  

## 2020-02-18 NOTE — Evaluation (Signed)
SLP Cancellation Note  Patient Details Name: Lucas White MRN: [id number] DOB: [birth year]   Cancelled treatment:       Reason Eval/Treat Not Completed: Other (comment);Patient's level of consciousness (per chart review, pt waxes and wanes from agitation to lethargic, will follow up next date 2/17 for evaluation if pt able to participate.  Thanks.)  Rolena Infante, MS [facility] SLP Acute Rehab Services Office [phone] Pager [phone]    Chales Abrahams 02/18/2020, 2:28 PM

## 2020-02-18 NOTE — ED Notes (Signed)
Pt sitting up, pulling against restraints, attempting to leave. Pt states "i'm done. I'm getting the fuck up out of here"

## 2020-02-18 NOTE — ED Notes (Signed)
Pt out of bed again, removed all monitors, cuffs and cables. Standing in the corner of the room blocking the door. Pt redirected back into bed, MD made aware.

## 2020-02-18 NOTE — ED Notes (Signed)
Wife, [name], updated via phone

## 2020-02-18 NOTE — Procedures (Signed)
Patient Name: Lucas White  MRN: [id number]  Epilepsy Attending: Charlsie Quest  Referring Physician/Provider: Zenia Resides, NP Date: 02/18/2020  Duration: 23.16 mins  Patient history: 59yo M with ams. EEG to evaluate for seizure  Level of alertness: Awake, asleep  AEDs during EEG study: GBP, LRZ  Technical aspects: This EEG study was done with scalp electrodes positioned according to the 10-20 International system of electrode placement. Electrical activity was acquired at a sampling rate of 500Hz  and reviewed with a high frequency filter of 70Hz  and a low frequency filter of 1Hz . EEG data were recorded continuously and digitally stored.   Description: The posterior dominant rhythm consists of 8 Hz activity of moderate voltage (25-35 uV) seen predominantly in posterior head regions, symmetric and reactive to eye opening and eye closing. Sleep was characterized by vertex waves, sleep spindles (12 to 14 Hz), maximal frontocentral region.  EEG showed continuous generalized 5 to 7 Hz theta as well as intermittent 2-3Hz  delta slowing. Hyperventilation and photic stimulation were not performed.     ABNORMALITY -Continuous slow, generalized  IMPRESSION: This study is suggestive of mild diffuse encephalopathy, nonspecific etiology. No seizures or epileptiform discharges were seen throughout the recording.  Quynh Basso 

## 2020-02-18 NOTE — ED Notes (Signed)
Pt removed both IV's. New IV placed and reinforced with coban. Pt linens changed. Repositioned. Warm blankets provided. Pt placed on cardiac monitor and VS obtained.

## 2020-02-18 NOTE — ED Notes (Signed)
Pt pulled right hand out of mitten

## 2020-02-18 NOTE — ED Notes (Signed)
Pt attempting to get up, pulling at restraints

## 2020-02-18 NOTE — ED Notes (Signed)
Pt states "You bout to get some of this damn chocolate, that's what you bout to get"

## 2020-02-18 NOTE — Progress Notes (Signed)
PROGRESS NOTE    Lucas Albeendrew J Fatheree  NWG:[id number] DOB: 1960/02/01 DOA: 02/17/2020 PCP: Sondra ComeWoods, Samuel, MD    Brief Narrative:  Lucas White is a 60 year old male with past medical history significant for type 2 diabetes mellitus, chronic low back pain, chronic opioid use who [facility] with lethargy and confusion that is progressed over the past few days.  Patient is significantly altered and much of the HPI obtained from chart review, ED/EMS report.  Apparently patient has been noncompliant over the last few days in regards to his home medications and intaking high amounts of carbohydrates.  In the ED, capture 98.1, HR 54, RR 20, BP 194/86, SPO2 98% on room air.  VBG with pH 7.47/PaCO2 33.4/PaO2 42.8.  Sodium 136, potassium 3.3, chloride 101, CO2 24, glucose 414, BUN 7, creatinine 0.90.  AST 22, ALT 18, total bilirubin 0.5.  Lactic acid 2.2.  WBC 10.0, hemoglobin 12.4, platelets 410.  Urinalysis with negative nitrite, negative leukocytes, negative ketones, and no bacteria seen.  EtOH level less than 10.  UDS positive for benzos.  Ammonia level 22. Chest X-ray with no acute cardiopulmonary disease process.  CT head without contrast limited study secondary to motion but no acute intracranial abnormality appreciated.Patient was given Ativan, droperidol due to increasing agitation and placed in restraints.     Assessment & Plan:   Principal Problem:   Confusion and disorientation Active Problems:   Type 2 diabetes mellitus with hyperglycemia, with long-term current use of insulin (HCC)   Hypokalemia   Chronic pain syndrome   Acute metabolic encephalopathy   Acute metabolic encephalopathy, POA Patient presenting to the ED with progressive confusion and medication noncompliance per family.  Patient is afebrile without leukocytosis.  Urinalysis unrevealing.  No concerning findings on CT head or chest x-ray.  EtOH level less than 10 and ammonia level 22.  UDS positive for  benzodiazepines.  TSH 0.319.  B12 1318 and folate 7.6.  HIV noneractive. EEG with no epileptiform discharges or seizure-like activity.  Unlikely infectious etiology.  Suspect EtOH/opiate withdrawal versus ingestion --PCCM following for severe agitation resistant to Ativan; appreciate assistance --continue Haldol 5 mg IV q6h prn agitation --Ativan 2 mg IV q2h prn aggitation if Haldol not effective --Serum osmolality: Pending --Consider MR brain and neurology evaluation next 24h if no improvement of symptoms --Continue restraints for pulling at medical equipment/IVs --Supportive care, stepdown level of care  Type 2 diabetes mellitus --Check hemoglobin A1c --moderate SSI for coverage --CBGs qAC/HS --NS at 7975mL/hr; NPO until mental status improves  Chronic pain syndrome --Gabapentin 600 mg p.o. 3 times daily if able to tolerate p.o. intake  Depression/anxiety --Lexapro 20 mg p.o. daily if able to tolerate p.o. intake  Essential hypertension --Continue diltiazem 180 mg p.o. daily if able to tolerate p.o. intake --Labetalol 10mg  IV q4h prn SBP >170 or DBP >110   DVT prophylaxis: Lovenox   Code Status: Full Code Family Communication: No family present at bedside, phone numbers in chart incorrect  Disposition Plan:  Level of care: Stepdown Status is: Inpatient  Remains inpatient appropriate because:Altered mental status, Ongoing diagnostic testing needed not appropriate for outpatient work up, Unsafe d/c plan, IV treatments appropriate due to intensity of illness or inability to take PO and Inpatient level of care appropriate due to severity of illness   Dispo: The patient is from: Home              Anticipated d/c is to: Home  Anticipated d/c date is: 3 days              Patient currently is not medically stable to d/c.   Difficult to place patient No  Consultants:   PCCM  Procedures:   None  Antimicrobials:   None   Subjective: Patient seen and examined  at bedside, continues in ED holding area.  Did overnight for metabolic encephalopathy of unclear etiology but suspect withdrawal/intoxication.  No family present.  Patient with mumbling speech otherwise incoherent.  Currently in four-point restraints.  Nursing concerned regarding amount of IV Ativan administered so far today; which has been relatively ineffective.  Ordered RN to give IV Haldol and consult PCCM for consideration of Precedex drip.  Objective: Vitals:   02/18/20 1025 02/18/20 1107 02/18/20 1300 02/18/20 1600  BP: (!) 119/108 (!) 124/91 (!) 144/130   Pulse: 95 71 82   Resp: 20 15 14    Temp:    98.8 F (37.1 C)  TempSrc:    Axillary  SpO2:  100% 100%   Weight:      Height:        Intake/Output Summary (Last 24 hours) at 02/18/2020 1716 Last data filed at 02/18/2020 0116 Gross per 24 hour  Intake 1000 ml  Output --  Net 1000 ml   Filed Weights   02/17/20 2117  Weight: 90 kg    Examination:  General exam: Confused, agitated, in four-point restraints Respiratory system: Clear to auscultation. Respiratory effort normal.  Oxygenating well on room air Cardiovascular system: S1 & S2 heard, RRR. No JVD, murmurs, rubs, gallops or clicks. No pedal edema. Gastrointestinal system: Abdomen is nondistended, soft and nontender. No organomegaly or masses felt. Normal bowel sounds heard. Central nervous system: Confused, moves all extremities independently, PERRLA, otherwise no focal neurological deficits. Extremities: Moves all extremities independently Skin: No rashes, lesions or ulcers Psychiatry: Judgement and insight appear poor.  Agitated    Data Reviewed: I have personally reviewed following labs and imaging studies  CBC: Recent Labs  Lab 02/17/20 2200 02/17/20 2208 02/18/20 0420  WBC 10.0  --  9.6  NEUTROABS 6.7  --  6.0  HGB 12.4* 12.9* 12.5*  HCT 36.8* 38.0* 36.8*  MCV 87.0  --  87.2  PLT 410*  --  398   Basic Metabolic Panel: Recent Labs  Lab  02/17/20 2200 02/17/20 2208 02/18/20 0420  NA 136 138 140  K 3.3* 3.4* 3.4*  CL 101 101 104  CO2 24  --  27  GLUCOSE 414* 414* 177*  BUN 7 4* 6  CREATININE 0.90 0.70 0.81  CALCIUM 9.1  --  9.0  MG  --   --  2.1   GFR: Estimated Creatinine Clearance: 107.2 mL/min (by C-G formula based on SCr of 0.81 mg/dL). Liver Function Tests: Recent Labs  Lab 02/17/20 2200 02/18/20 0420  AST 22 21  ALT 18 18  ALKPHOS 111 104  BILITOT 0.5 0.7  PROT 7.5 7.3  ALBUMIN 4.0 3.9   Recent Labs  Lab 02/17/20 2200  LIPASE 40   Recent Labs  Lab 02/18/20 0420  AMMONIA 22   Coagulation Profile: No results for input(s): INR, PROTIME in the last 168 hours. Cardiac Enzymes: No results for input(s): CKTOTAL, CKMB, CKMBINDEX, TROPONINI in the last 168 hours. BNP (last 3 results) No results for input(s): PROBNP in the last 8760 hours. HbA1C: No results for input(s): HGBA1C in the last 72 hours. CBG: Recent Labs  Lab 02/17/20 2118 02/18/20 0228 02/18/20  0617 02/18/20 0809 02/18/20 1611  GLUCAP 433* 139* 199* 226* 153*   Lipid Profile: No results for input(s): CHOL, HDL, LDLCALC, TRIG, CHOLHDL, LDLDIRECT in the last 72 hours. Thyroid Function Tests: Recent Labs    02/18/20 0420  TSH 0.319*  FREET4 1.03   Anemia Panel: Recent Labs    02/18/20 0420  VITAMINB12 1,318*  FOLATE 7.6   Sepsis Labs: Recent Labs  Lab 02/17/20 2200  LATICACIDVEN 2.2*    Recent Results (from the past 240 hour(s))  SARS CORONAVIRUS 2 (TAT 6-24 HRS) Nasopharyngeal Nasopharyngeal Swab     Status: None   Collection Time: 02/18/20  2:25 AM   Specimen: Nasopharyngeal Swab  Result Value Ref Range Status   SARS Coronavirus 2 NEGATIVE NEGATIVE Final    Comment: (NOTE) SARS-CoV-2 target nucleic acids are NOT DETECTED.  The SARS-CoV-2 RNA is generally detectable in upper and lower respiratory specimens during the acute phase of infection. Negative results do not preclude SARS-CoV-2 infection, do not  rule out co-infections with other pathogens, and should not be used as the sole basis for treatment or other patient management decisions. Negative results must be combined with clinical observations, patient history, and epidemiological information. The expected result is Negative.  Fact Sheet for Patients: HairSlick.no  Fact Sheet for Healthcare Providers: quierodirigir.com  This test is not yet approved or cleared by the [place] FDA and  has been authorized for detection and/or diagnosis of SARS-CoV-2 by FDA under an Emergency Use Authorization (EUA). This EUA will remain  in effect (meaning this test can be used) for the duration of the COVID-19 declaration under Se ction 564(b)(1) of the Act, 21 U.S.C. section 360bbb-3(b)(1), unless the authorization is terminated or revoked sooner.  Performed at [facility], [address], [place], [place] [address]          Radiology Studies: CT Head Wo Contrast  Result Date: 02/18/2020 CLINICAL DATA:  Altered mental status. EXAM: CT HEAD WITHOUT CONTRAST TECHNIQUE: Contiguous axial images were obtained from the base of the skull through the vertex without intravenous contrast. COMPARISON:  December 13, 2018 FINDINGS: The study is markedly limited secondary to patient motion. Brain: No evidence of acute infarction, hemorrhage, hydrocephalus, extra-axial collection or mass lesion/mass effect. Vascular: No hyperdense vessel or unexpected calcification. Skull: Normal. Negative for fracture or focal lesion. Sinuses/Orbits: No acute finding. Other: None. IMPRESSION: 1. Markedly limited study secondary to patient motion. 2. No acute intracranial abnormality. Electronically Signed   By: Aram Candela M.D.   On: 02/18/2020 02:01   DG Chest Port 1 View  Result Date: 02/17/2020 CLINICAL DATA:  Altered mental status. EXAM: PORTABLE CHEST 1 VIEW COMPARISON:  Radiograph 12/19/2018  FINDINGS: Mild chronic elevation of right hemidiaphragm.The cardiomediastinal contours are normal. The lungs are clear. Pulmonary vasculature is normal. No consolidation, pleural effusion, or pneumothorax. No acute osseous abnormalities are seen. IMPRESSION: No acute chest findings. Electronically Signed   By: Narda Rutherford M.D.   On: 02/17/2020 23:49   EEG adult  Result Date: 02/18/2020 Charlsie Quest, MD     02/18/2020  3:19 PM Patient Name: JERRIN RECORE MRN: [id number] Epilepsy Attending: Charlsie Quest Referring Physician/Provider: Zenia Resides, NP Date: 02/18/2020 Duration: 23.16 mins Patient history: 59yo M with ams. EEG to evaluate for seizure Level of alertness: Awake, asleep AEDs during EEG study: GBP, LRZ Technical aspects: This EEG study was done with scalp electrodes positioned according to the 10-20 International system of electrode placement. Electrical activity was  acquired at a sampling rate of 500Hz  and reviewed with a high frequency filter of 70Hz  and a low frequency filter of 1Hz . EEG data were recorded continuously and digitally stored. Description: The posterior dominant rhythm consists of 8 Hz activity of moderate voltage (25-35 uV) seen predominantly in posterior head regions, symmetric and reactive to eye opening and eye closing. Sleep was characterized by vertex waves, sleep spindles (12 to 14 Hz), maximal frontocentral region.  EEG showed continuous generalized 5 to 7 Hz theta as well as intermittent 2-3Hz  delta slowing. Hyperventilation and photic stimulation were not performed.   ABNORMALITY -Continuous slow, generalized IMPRESSION: This study is suggestive of mild diffuse encephalopathy, nonspecific etiology. No seizures or epileptiform discharges were seen throughout the recording. Priyanka        Scheduled Meds: . chlorhexidine  15 mL Mouth Rinse BID  . Chlorhexidine Gluconate Cloth  6 each Topical Daily  . diltiazem  180 mg Oral Daily  . enoxaparin  (LOVENOX) injection  40 mg Subcutaneous Q24H  . escitalopram  20 mg Oral Daily  . gabapentin  600 mg Oral TID  . insulin aspart  0-15 Units Subcutaneous TID AC & HS  . insulin glargine  15 Units Subcutaneous QHS  . mouth rinse  15 mL Mouth Rinse q12n4p  . potassium chloride  40 mEq Oral Once  . pravastatin  40 mg Oral Daily  . thiamine injection  100 mg Intravenous Daily   Continuous Infusions: . sodium chloride 75 mL/hr at 02/18/20 1703  . lactated ringers Stopped (02/18/20 0415)     LOS: 0 days    Time spent: 39 minutes spent on chart review, discussion with nursing staff, consultants, updating family and interview/physical exam; more than 50% of that time was spent in counseling and/or coordination of care.    Annabelle Harman 02/20/20, DO [facility] Available via Epic secure chat 7am-7pm After these hours, please refer to coverage provider listed on [email or web address] 02/18/2020, 5:16 PM

## 2020-02-18 NOTE — ED Notes (Addendum)
Pt pulled EKG leads off again. Pt placed in mittens

## 2020-02-18 NOTE — H&P (Signed)
History and Physical    Lucas White SWF:[id number] DOB: 08/21/1960 DOA: 02/17/2020  PCP: [name], MD  Patient coming from: Home   Chief Complaint:  Chief Complaint  Patient presents with  . Hyperglycemia     HPI:    60 year old male with past medical history of diabetes mellitus type 2, chronic low back pain, chronic opiate use who presents to [facility] long hospital emergency department due to family concerns for worsening hyperglycemia over the past several days as well as worsening confusion.  Poor historian due to acute agitation and therefore the majority the history is been obtained from discussions with the emergency department staff.  Attempts were made to contact the patient's wife but we were unsuccessful.  According to the patient's family, patient has developed increasingly worsening glycemic control over the past several days.  Additionally, patient has been exhibiting odd behavior eating excessive amounts of sweets.  This is been associated with increasing bouts of confusion and agitation.  Due to concerns over patient's worsening behavior EMS was contacted and the patient was promptly evaluated and brought to the hospital emergency department for evaluation.  Upon evaluation in the emergency department patient has been extremely agitated and not following commands.  Patient has not exhibited any focal weakness to bring about concern for acute stroke however CT imaging head was performed and was found to be unremarkable.  Urine toxicology screen was obtained and was also found to be unremarkable.  Patient was administered 2 doses of Ativan and 1 dose of Geodon without significant sedating effect.  Patient unfortunately had to eventually be placed in four-point restraints by the emergency department staff.  The hospitalist group was then called to assess patient for admission the hospital.   Review of Systems:   Review of Systems  Unable to perform ROS: Mental status  change    Past Medical History:  Diagnosis Date  . Chronic back pain greater than 3 months duration   . Obesity (BMI 30.0-34.9)   . Pneumonia due to COVID-19 virus 12/19/2018  . Type 2 diabetes mellitus (HCC)     Past Surgical History:  Procedure Laterality Date  . BACK SURGERY    . COLONOSCOPY       reports that he has never smoked. He has never used smokeless tobacco. He reports that he does not drink alcohol and does not use drugs.  No Known Allergies  Family History  Problem Relation Age of Onset  . Diabetes Father      Prior to Admission medications   Medication Sig Start Date End Date Taking? Authorizing Provider  cloNIDine (CATAPRES) 0.1 MG tablet Take 0.1 mg by mouth 3 (three) times daily.    [provider]  dexamethasone (DECADRON) 6 MG tablet Take 1 tablet (6 mg total) by mouth daily. 12/23/18   Leatha Gilding, MD  diltiazem (TIAZAC) 180 MG 24 hr capsule Take 180 mg by mouth daily.    [provider]  escitalopram (LEXAPRO) 20 MG tablet Take 20 mg by mouth daily.    [provider]  gabapentin (NEURONTIN) 600 MG tablet Take 600 mg by mouth 3 (three) times daily.    [provider]  glipiZIDE (GLUCOTROL) 10 MG tablet Take 10 mg by mouth daily before breakfast.    [provider]  guaiFENesin-dextromethorphan (ROBITUSSIN DM) 100-10 MG/5ML syrup Take 10 mLs by mouth every 4 (four) hours as needed for cough. 12/23/18   Leatha Gilding, MD  HYDROcodone-acetaminophen (NORCO/VICODIN) 5-325 MG tablet  Take 1 tablet by mouth 3 (three) times daily. 11/11/18   [provider]  hydrocortisone (ANUSOL-HC) 2.5 % rectal cream Place 1 application rectally 2 (two) times daily. Patient not taking: Reported on 12/21/2018 09/11/16   Emi HolesLaw, Alexandra M, PA-C  insulin glargine (LANTUS) 100 UNIT/ML injection Inject 30 Units into the skin at bedtime.    [provider]  metFORMIN (GLUCOPHAGE) 500 MG tablet Take 500 mg by mouth  2 (two) times daily with a meal.    [provider]  Oxycodone HCl 20 MG TABS Take 1 tablet by mouth every 6 (six) hours as needed (pain).     [provider]  pravastatin (PRAVACHOL) 40 MG tablet Take 40 mg by mouth daily.    [provider]  sildenafil (VIAGRA) 100 MG tablet Take 100 mg by mouth daily as needed for erectile dysfunction.    [provider]    Physical Exam: Vitals:   02/18/20 0234 02/18/20 0300 02/18/20 0433 02/18/20 0650  BP: (!) 141/81 134/74 133/73 (!) 119/100  Pulse: 68 72 84 90  Resp: 18 18 17 18   Temp: 98.1 F (36.7 C)     TempSrc: Oral     SpO2: 98% 98% 99% 100%  Weight:      Height:        Constitutional: Patient is agitated and confused.  Patient is not following commands Skin: no rashes, no lesions, good skin turgor noted. Eyes: Pupils are equally reactive to light.  No evidence of scleral icterus or conjunctival pallor.  ENMT: Moist mucous membranes noted.  Posterior pharynx clear of any exudate or lesions.   Neck: normal, supple, no masses, no thyromegaly.  No evidence of jugular venous distension.   Respiratory: clear to auscultation bilaterally, no wheezing, no crackles. Normal respiratory effort. No accessory muscle use.  Cardiovascular: Regular rate and rhythm, no murmurs / rubs / gallops. No extremity edema. 2+ pedal pulses. No carotid bruits.  Chest:   Nontender without crepitus or deformity.   Back:   Nontender without crepitus or deformity. Abdomen: Abdomen is soft and nontender.  No evidence of intra-abdominal masses.  Positive bowel sounds noted in all quadrants.   Musculoskeletal: No joint deformity upper and lower extremities. Good ROM, no contractures. Normal muscle tone.  Neurologic: Sensation grossly intact.  Patient moving all 4 extremities spontaneously.  Patient is not following commands but is responsive to verbal and painful stimuli.   Psychiatric: Patient is exhibiting extreme agitation and does not  seem to currently possess insight as to his current situation.    Labs on Admission: I have personally reviewed following labs and imaging studies -   CBC: Recent Labs  Lab 02/17/20 2200 02/17/20 2208 02/18/20 0420  WBC 10.0  --  9.6  NEUTROABS 6.7  --  6.0  HGB 12.4* 12.9* 12.5*  HCT 36.8* 38.0* 36.8*  MCV 87.0  --  87.2  PLT 410*  --  398   Basic Metabolic Panel: Recent Labs  Lab 02/17/20 2200 02/17/20 2208 02/18/20 0420  NA 136 138 140  K 3.3* 3.4* 3.4*  CL 101 101 104  CO2 24  --  27  GLUCOSE 414* 414* 177*  BUN 7 4* 6  CREATININE 0.90 0.70 0.81  CALCIUM 9.1  --  9.0  MG  --   --  2.1   GFR: Estimated Creatinine Clearance: 107.2 mL/min (by C-G formula based on SCr of 0.81 mg/dL). Liver Function Tests: Recent Labs  Lab 02/17/20 2200 02/18/20  0420  AST 22 21  ALT 18 18  ALKPHOS 111 104  BILITOT 0.5 0.7  PROT 7.5 7.3  ALBUMIN 4.0 3.9   Recent Labs  Lab 02/17/20 2200  LIPASE 40   Recent Labs  Lab 02/18/20 0420  AMMONIA 22   Coagulation Profile: No results for input(s): INR, PROTIME in the last 168 hours. Cardiac Enzymes: No results for input(s): CKTOTAL, CKMB, CKMBINDEX, TROPONINI in the last 168 hours. BNP (last 3 results) No results for input(s): PROBNP in the last 8760 hours. HbA1C: No results for input(s): HGBA1C in the last 72 hours. CBG: Recent Labs  Lab 02/17/20 2118 02/18/20 0228 02/18/20 0617 02/18/20 0809  GLUCAP 433* 139* 199* 226*   Lipid Profile: No results for input(s): CHOL, HDL, LDLCALC, TRIG, CHOLHDL, LDLDIRECT in the last 72 hours. Thyroid Function Tests: Recent Labs    02/18/20 0420  TSH 0.319*  FREET4 1.03   Anemia Panel: Recent Labs    02/18/20 0420  VITAMINB12 1,318*  FOLATE 7.6   Urine analysis:    Component Value Date/Time   COLORURINE STRAW (A) 02/17/2020 2200   APPEARANCEUR CLEAR 02/17/2020 2200   LABSPEC 1.025 02/17/2020 2200   PHURINE 7.0 02/17/2020 2200   GLUCOSEU >=500 (A) 02/17/2020 2200    HGBUR NEGATIVE 02/17/2020 2200   BILIRUBINUR NEGATIVE 02/17/2020 2200   KETONESUR NEGATIVE 02/17/2020 2200   PROTEINUR NEGATIVE 02/17/2020 2200   NITRITE NEGATIVE 02/17/2020 2200   LEUKOCYTESUR NEGATIVE 02/17/2020 2200    Radiological Exams on Admission - Personally Reviewed: CT Head Wo Contrast  Result Date: 02/18/2020 CLINICAL DATA:  Altered mental status. EXAM: CT HEAD WITHOUT CONTRAST TECHNIQUE: Contiguous axial images were obtained from the base of the skull through the vertex without intravenous contrast. COMPARISON:  December 13, 2018 FINDINGS: The study is markedly limited secondary to patient motion. Brain: No evidence of acute infarction, hemorrhage, hydrocephalus, extra-axial collection or mass lesion/mass effect. Vascular: No hyperdense vessel or unexpected calcification. Skull: Normal. Negative for fracture or focal lesion. Sinuses/Orbits: No acute finding. Other: None. IMPRESSION: 1. Markedly limited study secondary to patient motion. 2. No acute intracranial abnormality. Electronically Signed   By: Aram Candela M.D.   On: 02/18/2020 02:01   DG Chest Port 1 View  Result Date: 02/17/2020 CLINICAL DATA:  Altered mental status. EXAM: PORTABLE CHEST 1 VIEW COMPARISON:  Radiograph 12/19/2018 FINDINGS: Mild chronic elevation of right hemidiaphragm.The cardiomediastinal contours are normal. The lungs are clear. Pulmonary vasculature is normal. No consolidation, pleural effusion, or pneumothorax. No acute osseous abnormalities are seen. IMPRESSION: No acute chest findings. Electronically Signed   By: Narda Rutherford M.D.   On: 02/17/2020 23:49    EKG: Personally reviewed.  Rhythm is normal sinus rhythm with heart rate of 77 bpm.  No dynamic ST segment changes appreciated.  Assessment/Plan Principal Problem:   Confusion and disorientation   Patient is exhibiting a several day history of rapidly progressive confusion disorientation and bouts of agitation  In the emergency  department patient has become increasingly agitated requiring several doses of benzodiazepines as well as Geodon without much effect  Etiology of this delirium is not clear at this time.  Attempting to perform a thorough work-up including obtaining urine toxicology screen, CT imaging of the head evaluation of electrolytes, LFTs, ammonia level, vitamin B12, folate, TSH  Additionally evaluating for any evidence of infection with urinalysis, chest x-ray and Covid testing  Is worth noting that patient also exhibited substantial agitation requiring a thorough work-up during his 12/2018 hospitalization.  Initially, there was concern the patient may be suffering from neurosyphilis when his RPR came back abnormal however treponemal antibody testing ended up being negative.  I am repeating patient's RPR and treponemal antibody testing now.  Upon my evaluation of the patient, his symptoms do not particularly seem psychiatric in origin  Additionally, neck is seemingly supple with no fever or leukocytosis making meningitis unlikely  If initial work-up is negative we may need to get neurology's assistance in identifying a reversible medical cause for patient's presentation, perhaps an LP even  Patient may require higher level of care for administration of higher doses of sedating agents  Active Problems:   Type 2 diabetes mellitus with hyperglycemia, with long-term current use of insulin (HCC)  Patient initially presented with substantial hyperglycemia however this is rapidly improved with administration of intravenous isotonic fluids  Provide patient with Accu-Cheks and sliding scale insulin   Continuing home regimen of low-dose Lantus therapy nightly    Hypokalemia   Replacing with potassium chloride orally    Chronic pain syndrome   Holding any opiate therapies at this time    Code Status:  Full code Family Communication: I have attempted to call the wife several times and my phone  calls go to voicemail unfortunately.  Status is: Observation  The patient remains OBS appropriate and will d/c before 2 midnights.  Dispo: The patient is from: Home              Anticipated d/c is to: Home              Anticipated d/c date is: 2 days              Patient currently is not medically stable to d/c.   Difficult to place patient No        Marinda Elk MD [facility] Pager [phone]  If 7PM-7AM, please contact night-coverage [email or web address] Use universal [place] password for that web site. If you do not have the password, please call the hospital operator.  02/18/2020, 8:51 AM

## 2020-02-18 NOTE — Progress Notes (Signed)
Attempted to administer scheduled gabapentin. Pt refused to open mouth for pill; 1 300mg  pill wasted into sharps, second pill returned to pyxis

## 2020-02-18 NOTE — ED Notes (Signed)
[place], Do notified of pt's increased agitation, no new orders received

## 2020-02-18 NOTE — ED Notes (Signed)
Pt states "I got this damn shotgun you better know what I am damn talking about"

## 2020-02-18 NOTE — ED Notes (Signed)
Called report to Jamie, RN

## 2020-02-18 NOTE — ED Provider Notes (Signed)
Nursing notes and vitals signs, including pulse oximetry, reviewed.  Summary of this visit's results, reviewed by myself:  EKG:  EKG Interpretation  Date/Time:  Wednesday February 18 2020 01:00:19 EST Ventricular Rate:  77 PR Interval:    QRS Duration: 86 QT Interval:  393 QTC Calculation: 445 R Axis:   40 Text Interpretation: Sinus rhythm Low voltage, extremity leads No significant change was found Confirmed by Paula Libra ([id number]) on 02/18/2020 1:03:32 AM       Labs:  Results for orders placed or performed during the hospital encounter of 02/17/20 (from the past 24 hour(s))  CBG monitoring, ED     Status: Abnormal   Collection Time: 02/17/20  9:18 PM  Result Value Ref Range   Glucose-Capillary 433 (H) 70 - 99 mg/dL  Comprehensive metabolic panel     Status: Abnormal   Collection Time: 02/17/20 10:00 PM  Result Value Ref Range   Sodium 136 135 - 145 mmol/L   Potassium 3.3 (L) 3.5 - 5.1 mmol/L   Chloride 101 98 - 111 mmol/L   CO2 24 22 - 32 mmol/L   Glucose, Bld 414 (H) 70 - 99 mg/dL   BUN 7 6 - 20 mg/dL   Creatinine, Ser 2.54 0.61 - 1.24 mg/dL   Calcium 9.1 8.9 - 27.0 mg/dL   Total Protein 7.5 6.5 - 8.1 g/dL   Albumin 4.0 3.5 - 5.0 g/dL   AST 22 15 - 41 U/L   ALT 18 0 - 44 U/L   Alkaline Phosphatase 111 38 - 126 U/L   Total Bilirubin 0.5 0.3 - 1.2 mg/dL   GFR, Estimated >62 >37 mL/min   Anion gap 11 5 - 15  Ethanol     Status: None   Collection Time: 02/17/20 10:00 PM  Result Value Ref Range   Alcohol, Ethyl (B) <10 <10 mg/dL  Lipase, blood     Status: None   Collection Time: 02/17/20 10:00 PM  Result Value Ref Range   Lipase 40 11 - 51 U/L  Lactic acid, plasma     Status: Abnormal   Collection Time: 02/17/20 10:00 PM  Result Value Ref Range   Lactic Acid, Venous 2.2 (HH) 0.5 - 1.9 mmol/L  Urinalysis, Routine w reflex microscopic Urine, Random     Status: Abnormal   Collection Time: 02/17/20 10:00 PM  Result Value Ref Range   Color, Urine STRAW (A) YELLOW    APPearance CLEAR CLEAR   Specific Gravity, Urine 1.025 1.005 - 1.030   pH 7.0 5.0 - 8.0   Glucose, UA >=500 (A) NEGATIVE mg/dL   Hgb urine dipstick NEGATIVE NEGATIVE   Bilirubin Urine NEGATIVE NEGATIVE   Ketones, ur NEGATIVE NEGATIVE mg/dL   Protein, ur NEGATIVE NEGATIVE mg/dL   Nitrite NEGATIVE NEGATIVE   Leukocytes,Ua NEGATIVE NEGATIVE   Bacteria, UA NONE SEEN NONE SEEN  CBC with Differential     Status: Abnormal   Collection Time: 02/17/20 10:00 PM  Result Value Ref Range   WBC 10.0 4.0 - 10.5 K/uL   RBC 4.23 4.22 - 5.81 MIL/uL   Hemoglobin 12.4 (L) 13.0 - 17.0 g/dL   HCT 62.8 (L) 31.5 - 17.6 %   MCV 87.0 80.0 - 100.0 fL   MCH 29.3 26.0 - 34.0 pg   MCHC 33.7 30.0 - 36.0 g/dL   RDW 16.0 73.7 - 10.6 %   Platelets 410 (H) 150 - 400 K/uL   nRBC 0.0 0.0 - 0.2 %   Neutrophils Relative % 67 %  Neutro Abs 6.7 1.7 - 7.7 K/uL   Lymphocytes Relative 27 %   Lymphs Abs 2.7 0.7 - 4.0 K/uL   Monocytes Relative 5 %   Monocytes Absolute 0.5 0.1 - 1.0 K/uL   Eosinophils Relative 1 %   Eosinophils Absolute 0.1 0.0 - 0.5 K/uL   Basophils Relative 0 %   Basophils Absolute 0.0 0.0 - 0.1 K/uL   Immature Granulocytes 0 %   Abs Immature Granulocytes 0.04 0.00 - 0.07 K/uL  Blood gas, venous     Status: Abnormal   Collection Time: 02/17/20 10:00 PM  Result Value Ref Range   pH, Ven 7.475 (H) 7.250 - 7.430   pCO2, Ven 33.4 (L) 44.0 - 60.0 mmHg   pO2, Ven 42.8 32.0 - 45.0 mmHg   Bicarbonate 24.3 20.0 - 28.0 mmol/L   Acid-Base Excess 1.6 0.0 - 2.0 mmol/L   O2 Saturation 81.4 %   Patient temperature 98.6   I-stat chem 8, ED (not at [facility] or [facility])     Status: Abnormal   Collection Time: 02/17/20 10:08 PM  Result Value Ref Range   Sodium 138 135 - 145 mmol/L   Potassium 3.4 (L) 3.5 - 5.1 mmol/L   Chloride 101 98 - 111 mmol/L   BUN 4 (L) 6 - 20 mg/dL   Creatinine, Ser 0.99 0.61 - 1.24 mg/dL   Glucose, Bld 833 (H) 70 - 99 mg/dL   Calcium, Ion 8.25 0.53 - 1.40 mmol/L   TCO2 23 22 - 32 mmol/L    Hemoglobin 12.9 (L) 13.0 - 17.0 g/dL   HCT 97.6 (L) 73.4 - 19.3 %  Rapid urine drug screen (hospital performed)     Status: Abnormal   Collection Time: 02/17/20 11:05 PM  Result Value Ref Range   Opiates NONE DETECTED NONE DETECTED   Cocaine NONE DETECTED NONE DETECTED   Benzodiazepines POSITIVE (A) NONE DETECTED   Amphetamines NONE DETECTED NONE DETECTED   Tetrahydrocannabinol NONE DETECTED NONE DETECTED   Barbiturates NONE DETECTED NONE DETECTED  CBG monitoring, ED     Status: Abnormal   Collection Time: 02/18/20  2:28 AM  Result Value Ref Range   Glucose-Capillary 139 (H) 70 - 99 mg/dL    Imaging Studies: CT Head Wo Contrast  Result Date: 02/18/2020 CLINICAL DATA:  Altered mental status. EXAM: CT HEAD WITHOUT CONTRAST TECHNIQUE: Contiguous axial images were obtained from the base of the skull through the vertex without intravenous contrast. COMPARISON:  December 13, 2018 FINDINGS: The study is markedly limited secondary to patient motion. Brain: No evidence of acute infarction, hemorrhage, hydrocephalus, extra-axial collection or mass lesion/mass effect. Vascular: No hyperdense vessel or unexpected calcification. Skull: Normal. Negative for fracture or focal lesion. Sinuses/Orbits: No acute finding. Other: None. IMPRESSION: 1. Markedly limited study secondary to patient motion. 2. No acute intracranial abnormality. Electronically Signed   By: Aram Candela M.D.   On: 02/18/2020 02:01   DG Chest Port 1 View  Result Date: 02/17/2020 CLINICAL DATA:  Altered mental status. EXAM: PORTABLE CHEST 1 VIEW COMPARISON:  Radiograph 12/19/2018 FINDINGS: Mild chronic elevation of right hemidiaphragm.The cardiomediastinal contours are normal. The lungs are clear. Pulmonary vasculature is normal. No consolidation, pleural effusion, or pneumothorax. No acute osseous abnormalities are seen. IMPRESSION: No acute chest findings. Electronically Signed   By: Narda Rutherford M.D.   On: 02/17/2020 23:49    2:50 AM The cause of the patient's delirium is unclear but may be related to hyperglycemic hyperosmotic state.  His agitation has improved  after droperidol and Ativan IV. Sugar down to 139. Dr. Leafy Half to admit.  CRITICAL CARE Performed by: Carlisle Beers Israa Caban Total critical care time: 30 minutes Critical care time was exclusive of separately billable procedures and treating other patients. Critical care was necessary to treat or prevent imminent or life-threatening deterioration. Critical care was time spent personally by me on the following activities: development of treatment plan with patient and/or surrogate as well as nursing, discussions with consultants, evaluation of patient's response to treatment, examination of patient, obtaining history from patient or surrogate, ordering and performing treatments and interventions, ordering and review of laboratory studies, ordering and review of radiographic studies, pulse oximetry and re-evaluation of patient's condition.    Charlayne Vultaggio, Jonny Ruiz, MD 02/18/20 [phone]

## 2020-02-18 NOTE — ED Notes (Signed)
Pt states "I'm about to this damn banana peel on that damn banana"

## 2020-02-18 NOTE — ED Notes (Signed)
Pt states "I'm about to get off this couch quick and fast"

## 2020-02-18 NOTE — ED Notes (Signed)
Pt states "quick quick quack"

## 2020-02-18 NOTE — ED Notes (Signed)
Pt states "what about the elephant?"

## 2020-02-18 NOTE — Consult Note (Signed)
NAME:  Lucas White, MRN:  [id number], DOB:  [birth year], LOS: 0 ADMISSION DATE:  02/17/2020, CONSULTATION DATE:  2/16 REFERRING MD:  [place], CHIEF COMPLAINT:  Delirium    Brief History:  60 year old male who presented via EMS to [facility] w/ several day h/o poor glycemic control (increased hyperglycemia)odd behavior (eating more sweets than usual) and increased bouts of confusion and agitation. EMS called as this behavior became more escalated and persistent. In ER confused. CT neg, WBC nml, no nuchal rigidity. Initial urine tox + for benzos (but had gotten ativan prior). Received ativan total of 8.5 mg total from 2236 on 2/15 to 2/16 at 1300, geodon 20mg  at 0400 on 2/14. Apparently not responding to ativan per nursing staff. IV haldol given 5 mg IV w/ excellent initial results. Pt went from being agitated and confused to resting comfortably and sedated. PCCM asked to eval for possible precedex need.   History of Present Illness:  As above   Past Medical History:  DM type II, HTN, chronic low back pain on opiates and neurontin, had delirium in prior hospital stay w/ weakly + RPR   Significant Hospital Events:  2/15 admitted CT head neg.  2/16  Received ativan total of 8.5 mg total from 2236 on 2/15 to 2/16 at 1300, geodon 20mg  at 0400 on 2/14. Apparently not responding to ativan per nursing staff. IV haldol given 5 mg IV w/ excellent initial results. Consults:   Procedures:    Significant Diagnostic Tests:  CT head 2/16 low quality due to motion but normal Micro Data:    Antimicrobials:     Interim History / Subjective:  Now sedated   Objective   Blood pressure (Abnormal) 124/91, pulse 71, temperature 98.1 F (36.7 C), temperature source Oral, resp. rate 15, height 5\' 11"  (1.803 m), weight 90 kg, SpO2 100 %.        Intake/Output Summary (Last 24 hours) at 02/18/2020 1259 Last data filed at 02/18/2020 0116 Gross per 24 hour  Intake 1000 ml  Output no documentation  Net 1000 ml    Filed Weights   02/17/20 2117  Weight: 90 kg    Examination: General: otherwise healthy appearing black male sedated now s/p haldol HENT: NCAT  Lungs: clear currently on room air  Cardiovascular: RRR Abdomen: soft not tender  Extremities: warm and dry  Neuro: now sedated prior was awake extremely agitated. Moving all exts. Yelling out. No focal def. No nuchal rigidity  GU: due to void   Resolved Hospital Problem list     Assessment & Plan:   Acute Metabolic Encephalopathy Agitated delirium  Type II DM w/ hyperglycemia  H/o HTN  Mild hypokalemia  Mild anemia   Acute metabolic encephalopathy.   -etiology not clear. Ddx: drug w/d? Takes Neurontin wonder if stopped abruptly, seizure ( post-ictal state), CNS infection (low level of suspicion for this as no nuchal rigidity), overdose? Although his UDS was only neg (had + benzo in urine but this was after received ativan). Also could consider hypertensive encephalopathy (although BP has been nml here) More history would be helpful BUT wife not available  Plan Cont PRN haldol  Ck Qtc prior to haldol  Will order EEG Will order total CKs (might be more elevated If had seizure) Keep euglycemic Would involve neuro. MRI probably helpful.  Cont his Neurontin   0117 ACNP-BC [facility] Pulmonary/Critical Care Pager # [phone] OR # [phone] if no answer       Best  practice (evaluated daily)  Per primary   Labs   CBC: Recent Labs  Lab 02/17/20 2200 02/17/20 2208 02/18/20 0420  WBC 10.0  --  9.6  NEUTROABS 6.7  --  6.0  HGB 12.4* 12.9* 12.5*  HCT 36.8* 38.0* 36.8*  MCV 87.0  --  87.2  PLT 410*  --  398    Basic Metabolic Panel: Recent Labs  Lab 02/17/20 2200 02/17/20 2208 02/18/20 0420  NA 136 138 140  K 3.3* 3.4* 3.4*  CL 101 101 104  CO2 24  --  27  GLUCOSE 414* 414* 177*  BUN 7 4* 6  CREATININE 0.90 0.70 0.81  CALCIUM 9.1  --  9.0  MG  --   --  2.1   GFR: Estimated Creatinine Clearance:  107.2 mL/min (by C-G formula based on SCr of 0.81 mg/dL). Recent Labs  Lab 02/17/20 2200 02/18/20 0420  WBC 10.0 9.6  LATICACIDVEN 2.2*  --     Liver Function Tests: Recent Labs  Lab 02/17/20 2200 02/18/20 0420  AST 22 21  ALT 18 18  ALKPHOS 111 104  BILITOT 0.5 0.7  PROT 7.5 7.3  ALBUMIN 4.0 3.9   Recent Labs  Lab 02/17/20 2200  LIPASE 40   Recent Labs  Lab 02/18/20 0420  AMMONIA 22    ABG    Component Value Date/Time   PHART 7.395 02/17/2008 0500   PCO2ART 41.8 02/17/2008 0500   PO2ART 134.0 (H) 02/17/2008 0500   HCO3 24.3 02/17/2020 2200   TCO2 23 02/17/2020 2208   ACIDBASEDEF 2.0 12/19/2018 0630   O2SAT 81.4 02/17/2020 2200     Coagulation Profile: No results for input(s): INR, PROTIME in the last 168 hours.  Cardiac Enzymes: No results for input(s): CKTOTAL, CKMB, CKMBINDEX, TROPONINI in the last 168 hours.  HbA1C: No results found for: HGBA1C  CBG: Recent Labs  Lab 02/17/20 2118 02/18/20 0228 02/18/20 0617 02/18/20 0809  GLUCAP 433* 139* 199* 226*    Review of Systems:   Not able to give ROS  Past Medical History:  He,  has a past medical history of Chronic back pain greater than 3 months duration, Obesity (BMI 30.0-34.9), Pneumonia due to COVID-19 virus (12/19/2018), and Type 2 diabetes mellitus (HCC).   Surgical History:   Past Surgical History:  Procedure Laterality Date  . BACK SURGERY    . COLONOSCOPY       Social History:   reports that he has never smoked. He has never used smokeless tobacco. He reports that he does not drink alcohol and does not use drugs.   Family History:  His family history includes Diabetes in his father.   Allergies No Known Allergies   Home Medications  Prior to Admission medications   Medication Sig Start Date End Date Taking? Authorizing Provider  cholecalciferol (VITAMIN D3) 25 MCG (1000 UNIT) tablet Take 1,000 Units by mouth daily.   Yes [provider]  diltiazem (TIAZAC) 180  MG 24 hr capsule Take 180 mg by mouth daily.   Yes [provider]  ergocalciferol (VITAMIN D2) 1.25 MG (50000 UT) capsule Take 50,000 Units by mouth once a week.   Yes [provider]  gabapentin (NEURONTIN) 300 MG capsule Take 600 mg by mouth 4 (four) times daily.   Yes [provider]  glipiZIDE (GLUCOTROL) 10 MG tablet Take 10 mg by mouth daily before breakfast.   Yes [provider]  glucose 4 GM chewable tablet Chew 16 tablets by mouth  as needed for low blood sugar (BS less than 70).   Yes [provider]  insulin glargine (LANTUS) 100 UNIT/ML injection Inject 20 Units into the skin at bedtime.   Yes [provider]  melatonin 3 MG TABS tablet Take 6 mg by mouth at bedtime.   Yes [provider]  metFORMIN (GLUCOPHAGE) 500 MG tablet Take 500 mg by mouth 2 (two) times daily with a meal.   Yes [provider]  Oxycodone HCl 20 MG TABS Take 20 mg by mouth 4 (four) times daily.   Yes [provider]  pravastatin (PRAVACHOL) 40 MG tablet Take 40 mg by mouth daily.   Yes [provider]  sildenafil (VIAGRA) 100 MG tablet Take 100 mg by mouth daily as needed for erectile dysfunction.   Yes [provider]  escitalopram (LEXAPRO) 20 MG tablet Take 20 mg by mouth daily.    [provider]     Critical care time: NA    Simonne Martinet ACNP-BC [facility] Pulmonary/Critical Care Pager # [phone] OR # [phone] if no answer

## 2020-02-18 NOTE — ED Notes (Signed)
Pt is calling for "Lawson Fiscal" and "Aurther Loft". No one by that name is here

## 2020-02-19 DIAGNOSIS — R410 Disorientation, unspecified: Secondary | ICD-10-CM | POA: Diagnosis not present

## 2020-02-19 LAB — GLUCOSE, CAPILLARY
Glucose-Capillary: 155 mg/dL — ABNORMAL HIGH (ref 70–99)
Glucose-Capillary: 138 mg/dL — ABNORMAL HIGH (ref 70–99)
Glucose-Capillary: 156 mg/dL — ABNORMAL HIGH (ref 70–99)
Glucose-Capillary: 95 mg/dL (ref 70–99)

## 2020-02-19 LAB — COMPREHENSIVE METABOLIC PANEL
ALT: 17 U/L (ref 0–44)
AST: 25 U/L (ref 15–41)
Albumin: 3.8 g/dL (ref 3.5–5.0)
Alkaline Phosphatase: 107 U/L (ref 38–126)
Anion gap: 9 (ref 5–15)
BUN: 8 mg/dL (ref 6–20)
CO2: 23 mmol/L (ref 22–32)
Calcium: 8.9 mg/dL (ref 8.9–10.3)
Chloride: 109 mmol/L (ref 98–111)
Creatinine, Ser: 0.76 mg/dL (ref 0.61–1.24)
GFR, Estimated: >60 mL/min (ref >60)
Glucose, Bld: 135 mg/dL — ABNORMAL HIGH (ref 70–99)
Potassium: 3.8 mmol/L (ref 3.5–5.1)
Sodium: 141 mmol/L (ref 135–145)
Total Bilirubin: 1 mg/dL (ref 0.3–1.2)
Total Protein: 6.9 g/dL (ref 6.5–8.1)

## 2020-02-19 LAB — CBC
HCT: 38.9 % — ABNORMAL LOW (ref 39.0–52.0)
Hemoglobin: 12.6 g/dL — ABNORMAL LOW (ref 13.0–17.0)
MCH: 29.2 pg (ref 26.0–34.0)
MCHC: 32.4 g/dL (ref 30.0–36.0)
MCV: 90 fL (ref 80.0–100.0)
Platelets: 369 10*3/uL (ref 150–400)
RBC: 4.32 MIL/uL (ref 4.22–5.81)
RDW: 12.2 % (ref 11.5–15.5)
WBC: 10.4 10*3/uL (ref 4.0–10.5)
nRBC: 0 % (ref 0.0–0.2)

## 2020-02-19 LAB — HEMOGLOBIN A1C
Hgb A1c MFr Bld: 12.6 % — ABNORMAL HIGH (ref 4.8–5.6)
Mean Plasma Glucose: 315 mg/dL

## 2020-02-19 LAB — OSMOLALITY: Osmolality: 295 mOsm/kg (ref 275–295)

## 2020-02-19 LAB — MAGNESIUM: Magnesium: 1.9 mg/dL (ref 1.7–2.4)

## 2020-02-19 LAB — T3: T3, Total: 74 ng/dL (ref 71–180)

## 2020-02-19 LAB — RPR: RPR Ser Ql: NONREACTIVE

## 2020-02-19 LAB — FLUORESCENT TREPONEMAL AB(FTA)-IGG-BLD: Fluorescent Treponemal Ab, IgG: NONREACTIVE

## 2020-02-19 LAB — GAMMA GT: GGT: 47 U/L (ref 7–50)

## 2020-02-19 MED ORDER — OXYCODONE HCL 5 MG PO TABS
5.0000 mg | ORAL_TABLET | Freq: Four times a day (QID) | ORAL | Status: DC | PRN
  Administered 2020-02-19 – 2020-02-21 (×7): 5 mg via ORAL
  Filled 2020-02-19 (×7): qty 1

## 2020-02-19 NOTE — Progress Notes (Signed)
eLink Physician-Brief Progress Note Patient Name: Lucas White DOB: March 30, 1960 MRN: [id number]   Date of Service  02/19/2020  HPI/Events of Note  Patient needs restraints order renewed due to continuing agitation and lack of cooperation posing a risk to his safety.  eICU Interventions  Restraints order renewed.        Thomasene Lot Lucas White 02/19/2020, 4:41 AM

## 2020-02-19 NOTE — Progress Notes (Addendum)
NAME:  Lucas White, MRN:  [id number], DOB:  Dec 25, 1960, LOS: 1 ADMISSION DATE:  02/17/2020, CONSULTATION DATE:  2/16 REFERRING MD:  [place], CHIEF COMPLAINT:  Delirium    Brief History:  60 year old male who presented via EMS to [facility] w/ several day h/o poor glycemic control (increased hyperglycemia)odd behavior (eating more sweets than usual) and increased bouts of confusion and agitation. EMS called as this behavior became more escalated and persistent. In ER confused. CT neg, WBC nml, no nuchal rigidity. Initial urine tox + for benzos (but had gotten ativan prior). Received ativan total of 8.5 mg total from 2236 on 2/15 to 2/16 at 1300, geodon 20mg  at 0400 on 2/14. Apparently not responding to ativan per nursing staff. IV haldol given 5 mg IV w/ excellent initial results. Pt went from being agitated and confused to resting comfortably and sedated. PCCM asked to eval for possible precedex need.   History of Present Illness:  As above   Past Medical History:  DM type II, HTN, chronic low back pain on opiates and neurontin, had delirium in prior hospital stay w/ weakly + RPR   Significant Hospital Events:  2/15 admitted CT head neg.  2/16  Received ativan total of 8.5 mg total from 2236 on 2/15 to 2/16 at 1300, geodon 20mg  at 0400 on 2/14. Apparently not responding to ativan per nursing staff. IV haldol given 5 mg IV w/ excellent initial results. Consults:   Procedures:    Significant Diagnostic Tests:  CT head 2/16 low quality due to motion but normal Micro Data:    Antimicrobials:     Interim History / Subjective:  Less agitated and decreased orientation  Objective   Blood pressure (!) 165/83, pulse 84, temperature 98.6 F (37 C), temperature source Axillary, resp. rate 19, height 5\' 11"  (1.803 m), weight 90 kg, SpO2 99 %.        Intake/Output Summary (Last 24 hours) at 02/19/2020 0749 Last data filed at 02/19/2020 0700 Gross per 24 hour  Intake 1044.76 ml  Output 200 ml   Net 844.76 ml   Filed Weights   02/17/20 2117  Weight: 90 kg    Examination: General: Well-nourished well-developed male no acute distress. HEENT: No JVD or lymphadenopathy is appreciated Neuro: Boisterous somewhat agitated but is able to state his name and where he is located at.  Continually requested his restraints be removed.  Moves all extremities x4 follows simple commands. CV: Sounds are regular regular rate and rhythm PULM: Clear to auscultation GI: soft, bsx4 active no pain to palpation Extremities: warm/dry, negative edema, currently in four-point restraints Skin: no rashes or lesions   Resolved Hospital Problem list     Assessment & Plan:   Acute Metabolic Encephalopathy Agitated delirium  Type II DM w/ hyperglycemia  H/o HTN  Mild hypokalemia  Mild anemia   Acute metabolic encephalopathy.   Etiology not clear. Ddx: drug w/d? Takes Neurontin wonder if stopped abruptly, seizure ( post-ictal state), CNS infection (low level of suspicion for this as no nuchal rigidity), overdose? Although his UDS was only neg (had + benzo in urine but this was after received ativan). Also could consider hypertensive encephalopathy (although BP has been nml here) More history would be helpful BUT wife not available   Plan Continue as needed Haldol, note improved mental status on 02/19/2020 EEG was unremarkable consistent with his encephalopathic state If he does not improve with the next 24 to 48 hours consider neurology consult Restraints as  needed Continue benzodiazepines Pulmonary critical care will be available as needed            Best practice (evaluated daily)  Per primary   Labs   CBC: Recent Labs  Lab 02/17/20 2200 02/17/20 2208 02/18/20 0420 02/19/20 0241  WBC 10.0  --  9.6 10.4  NEUTROABS 6.7  --  6.0  --   HGB 12.4* 12.9* 12.5* 12.6*  HCT 36.8* 38.0* 36.8* 38.9*  MCV 87.0  --  87.2 90.0  PLT 410*  --  398 369    Basic Metabolic Panel: Recent  Labs  Lab 02/17/20 2200 02/17/20 2208 02/18/20 0420 02/19/20 0241  NA 136 138 140 141  K 3.3* 3.4* 3.4* 3.8  CL 101 101 104 109  CO2 24  --  27 23  GLUCOSE 414* 414* 177* 135*  BUN 7 4* 6 8  CREATININE 0.90 0.70 0.81 0.76  CALCIUM 9.1  --  9.0 8.9  MG  --   --  2.1 1.9   GFR: Estimated Creatinine Clearance: 108.5 mL/min (by C-G formula based on SCr of 0.76 mg/dL). Recent Labs  Lab 02/17/20 2200 02/18/20 0420 02/19/20 0241  WBC 10.0 9.6 10.4  LATICACIDVEN 2.2*  --   --     Liver Function Tests: Recent Labs  Lab 02/17/20 2200 02/18/20 0420 02/19/20 0241  AST 22 21 25   ALT 18 18 17   ALKPHOS 111 104 107  BILITOT 0.5 0.7 1.0  PROT 7.5 7.3 6.9  ALBUMIN 4.0 3.9 3.8   Recent Labs  Lab 02/17/20 2200  LIPASE 40   Recent Labs  Lab 02/18/20 0420  AMMONIA 22    ABG    Component Value Date/Time   PHART 7.395 02/17/2008 0500   PCO2ART 41.8 02/17/2008 0500   PO2ART 134.0 (H) 02/17/2008 0500   HCO3 24.3 02/17/2020 2200   TCO2 23 02/17/2020 2208   ACIDBASEDEF 2.0 12/19/2018 0630   O2SAT 81.4 02/17/2020 2200     Coagulation Profile: No results for input(s): INR, PROTIME in the last 168 hours.  Cardiac Enzymes: Recent Labs  Lab 02/18/20 1520  CKTOTAL 300  CKMB 4.7    HbA1C: Hgb A1c MFr Bld  Date/Time Value Ref Range Status  02/18/2020 04:20 AM 12.6 (H) 4.8 - 5.6 % Final    Comment:    (NOTE)         Prediabetes: 5.7 - 6.4         Diabetes: >6.4         Glycemic control for adults with diabetes: <7.0     CBG: Recent Labs  Lab 02/18/20 0228 02/18/20 0617 02/18/20 0809 02/18/20 1611 02/18/20 2103  GLUCAP 139* 199* 226* 153* 129*    Steve Minor ACNP Acute Care Nurse Practitioner 02/20/20 Pulmonary/Critical Care Please consult Amion 02/19/2020, 7:49 AM   I have seen and evaluated the patient with Adolph Pollack Minor, NP. I agree with assessment and plan. No further recommendations at this time. Will be available if needed again in the future.    02/21/2020, MD [place] [facility] Office: [phone]   See Amion for Pager Details

## 2020-02-19 NOTE — Evaluation (Addendum)
SLP Cancellation Note  Patient Details Name: Lucas White MRN: [id number] DOB: 1960/08/24   Cancelled treatment:       Reason Eval/Treat Not Completed: Other (comment) (RN reports pt is not alert/appropriate for swallow evaluation, agitated and won't follow directions.  Will continue efforts.)  Rolena Infante, MS [facility] SLP Acute Rehab Services Office [phone] Pager [phone]   Chales Abrahams 02/19/2020, 8:28 AM

## 2020-02-19 NOTE — Progress Notes (Signed)
PROGRESS NOTE    Lucas White  DVV:[id number] DOB: 06/26/1960 DOA: 02/17/2020 PCP: [name], MD   Chief Complaint  Patient presents with  . Hyperglycemia   Brief Narrative: 17 yom w/T2DM,chronic low back pain,chronic opiate use brought to ED w/lethargy, confusion over the past few days.     As per report chart-patient is significantly altered and much of the HPI obtained from chart review, ED/EMS report.  Apparently patient has been noncompliant over the last few days in regards to his home medications and intaking high amounts of carbohydrates. In the ED,temp 98.1, HR 54, RR 20, BP 194/86, SPO2 98% on room air.  VBG with pH 7.47/PaCO2 33.4/PaO2 42.8.  Sodium 136, potassium 3.3,chloride 101, CO2 24, glucose 414, BUN 7, creatinine 0.90.  AST 22, ALT 18, total bilirubin 0.5.Lactic acid 2.2.  WBC 10.0, hemoglobin 12.4, platelets 410.  Urinalysis with negative nitrite, negative leukocytes, negative ketones, and no bacteria seen.EtOH level <10.UDS positive for benzos.GY6-94. CXR w/ no acute cardiopulmonary disease process. CT head without contrast limited study secondary to motion but no acute intracranial abnormality appreciated.Patient was given Ativan, droperidol due to increasing agitation and placed in restraints. He was moved to step down and ICU was consulted due to ongoing agitation.   Subjective: Seen and examined this morning Mildly sleepy did wake up and able to tell his name,is on four-point restraint and had bowel movement. Overnight needed Haldol with good response and did not respond with Ativan   Assessment & Plan:  Acute encephalopathy likely toxic versus metabolic, ? Drug-opiate withdrawal/alcohol withdrawal.  On Neurontin at home wonder if if he stopped abruptly.  So far work-up shows no evidence of infection, less likely CNS infection-afebrile no leukocytosis, serum osmol normal, UDS positive for benzo (it appears it was after receiving benzo in the ED), EEG no evidence  of seizure-shows encephalopathic state, tsh low 0.3, FT4 nl, [id number], folate 7.6, CK normal, RFT/LFTs normal,  HIV non reactive. Continue with current as needed restraint and Ativan/Haldol, delirium precaution, per PCCM following closely. Try low dose oxycodone ( he is on 20 mg at home) Mentation seems to be improvin, oriented to self, but not so agitated, is restless this morning.If no significant improvement next 24 to 48 hours neurology consultation eval.  Type 2 DM, with poorly controlled hemoglobin A1c 12.6, uncontrolled hyperglycemia on admission. Continue on sliding scale insulin, monitor CBG. Recent Labs  Lab 02/18/20 0809 02/18/20 1611 02/18/20 2103 02/19/20 0750 02/19/20 1148  GLUCAP 226* 153* 129* 155* 138*   Chronic Pain syndrome, on Neurontin 600 mg 3 times daily continue once able to take p.o.  Anxiety/Depression: On Lexapro 20 mg normally. Hypokalemia:resolved. Essential Hypertension: On Cardizem 120 at home, continue as needed labetalol Overweight w/ BMI 27: Weight loss strategy as outpatient  Diet Order            Diet NPO time specified  Diet effective now               Pt's Body mass index is 27.67 kg/m.  DVT prophylaxis: enoxaparin (LOVENOX) injection 40 mg Start: 02/18/20 1000 Code Status:   Code Status: Full Code  Family Communication: no family at bedside, phone no not available for wife ( listed [phone]).  Status JJ:[id number] Remains inpatient appropriate because:Altered mental status, Ongoing diagnostic testing needed not appropriate for outpatient work up and Unsafe d/c plan  Dispo:  Patient From: Home  Planned Disposition: TBD  Expected discharge date: 02/21/2020  Medically stable for discharge: No  Consultants:see note  Procedures:see note  Unresulted Labs (From admission, onward)          Start     Ordered   02/19/20 0500  CBC  Daily,   R      02/18/20 1741   02/19/20 0500  Comprehensive metabolic panel  Daily,   R      02/18/20  1741   02/19/20 0500  Magnesium  Daily,   R      02/18/20 1741   02/18/20 0613  Fluorescent treponemal ab(fta)-IgG-bld  Add-on,   AD        02/18/20 0612          Culture/Microbiology    Component Value Date/Time   SDES SPUTUM 12/19/2018 1053   SDES  12/19/2018 1053    SPUTUM Performed at [facility], [address] [place], [place] [address]    SPECREQUEST Immunocompromised 12/19/2018 1053   SPECREQUEST  12/19/2018 1053    Immunocompromised Reflexed from [id number] Performed at [facility], [address], [place], [place] [address]    CULT  12/19/2018 1053    FEW STAPHYLOCOCCUS AUREUS MODERATE HAEMOPHILUS INFLUENZAE BETA LACTAMASE NEGATIVE Performed at [facility], [address], [place], [place] [address]    REPTSTATUS 12/19/2018 FINAL 12/19/2018 1053   REPTSTATUS 12/22/2018 FINAL 12/19/2018 1053    Other culture-see note  Medications: Scheduled Meds: . chlorhexidine  15 mL Mouth Rinse BID  . Chlorhexidine Gluconate Cloth  6 each Topical Daily  . diltiazem  180 mg Oral Daily  . enoxaparin (LOVENOX) injection  40 mg Subcutaneous Q24H  . escitalopram  20 mg Oral Daily  . gabapentin  600 mg Oral TID  . insulin aspart  0-15 Units Subcutaneous TID AC & HS  . mouth rinse  15 mL Mouth Rinse q12n4p  . thiamine injection  100 mg Intravenous Daily   Continuous Infusions: . sodium chloride 75 mL/hr at 02/19/20 0700    Antimicrobials: Anti-infectives (From admission, onward)   None     Objective: Vitals: Today's Vitals   02/19/20 0900 02/19/20 1000 02/19/20 1100 02/19/20 1200  BP:  138/84 (!) 136/91 (!) 145/87  Pulse: 76 72 72 68  Resp: 18 (!) 23 (!) 25 (!) 28  Temp:    98.1 F (36.7 C)  TempSrc:    Axillary  SpO2: 97% 97% 99% 98%  Weight:      Height:      PainSc:    Asleep    Intake/Output Summary (Last 24 hours) at 02/19/2020 1232 Last data filed at 02/19/2020 0700 Gross per 24 hour  Intake 1044.76 ml   Output 200 ml  Net 844.76 ml   Filed Weights   02/17/20 2117  Weight: 90 kg   Weight change:   Intake/Output from previous day: 02/16 0701 - 02/17 0700 In: 1044.8 [I.V.:1044.8] Out: 200 [Urine:200] Intake/Output this shift: No intake/output data recorded. Filed Weights   02/17/20 2117  Weight: 90 kg    Examination: General exam: Sleepy/sedated able to wake up and tell me his name,  HEENT:Oral mucosa moist, Ear/Nose WNL grossly,dentition normal. Respiratory system: bilaterally clear,no wheezing or crackles,no use of accessory muscle, non tender. Cardiovascular system: S1 & S2 +,regular, No JVD. Gastrointestinal system: Abdomen soft, obese,NT,ND, BS+. Nervous System: Sleepy but able to move all his extremities needing restraint, appears nonfocal  Extremities: No edema, distal peripheral pulses palpable.  Skin: No rashes,no icterus. MSK: Normal muscle bulk,tone, power   Data Reviewed: I have personally  reviewed following labs and imaging studies CBC: Recent Labs  Lab 02/17/20 2200 02/17/20 2208 02/18/20 0420 02/19/20 0241  WBC 10.0  --  9.6 10.4  NEUTROABS 6.7  --  6.0  --   HGB 12.4* 12.9* 12.5* 12.6*  HCT 36.8* 38.0* 36.8* 38.9*  MCV 87.0  --  87.2 90.0  PLT 410*  --  398 369   Basic Metabolic Panel: Recent Labs  Lab 02/17/20 2200 02/17/20 2208 02/18/20 0420 02/19/20 0241  NA 136 138 140 141  K 3.3* 3.4* 3.4* 3.8  CL 101 101 104 109  CO2 24  --  27 23  GLUCOSE 414* 414* 177* 135*  BUN 7 4* 6 8  CREATININE 0.90 0.70 0.81 0.76  CALCIUM 9.1  --  9.0 8.9  MG  --   --  2.1 1.9   GFR: Estimated Creatinine Clearance: 108.5 mL/min (by C-G formula based on SCr of 0.76 mg/dL). Liver Function Tests: Recent Labs  Lab 02/17/20 2200 02/18/20 0420 02/19/20 0241  AST 22 21 25   ALT 18 18 17   ALKPHOS 111 104 107  BILITOT 0.5 0.7 1.0  PROT 7.5 7.3 6.9  ALBUMIN 4.0 3.9 3.8   Recent Labs  Lab 02/17/20 2200  LIPASE 40   Recent Labs  Lab 02/18/20 0420   AMMONIA 22   Coagulation Profile: No results for input(s): INR, PROTIME in the last 168 hours. Cardiac Enzymes: Recent Labs  Lab 02/18/20 1520  CKTOTAL 300  CKMB 4.7   BNP (last 3 results) No results for input(s): PROBNP in the last 8760 hours. HbA1C: Recent Labs    02/18/20 0420  HGBA1C 12.6*   CBG: Recent Labs  Lab 02/18/20 0809 02/18/20 1611 02/18/20 2103 02/19/20 0750 02/19/20 1148  GLUCAP 226* 153* 129* 155* 138*   Lipid Profile: No results for input(s): CHOL, HDL, LDLCALC, TRIG, CHOLHDL, LDLDIRECT in the last 72 hours. Thyroid Function Tests: Recent Labs    02/18/20 0420  TSH 0.319*  FREET4 1.03   Anemia Panel: Recent Labs    02/18/20 0420  VITAMINB12 1,318*  FOLATE 7.6   Sepsis Labs: Recent Labs  Lab 02/17/20 2200  LATICACIDVEN 2.2*    Recent Results (from the past 240 hour(s))  SARS CORONAVIRUS 2 (TAT 6-24 HRS) Nasopharyngeal Nasopharyngeal Swab     Status: None   Collection Time: 02/18/20  2:25 AM   Specimen: Nasopharyngeal Swab  Result Value Ref Range Status   SARS Coronavirus 2 NEGATIVE NEGATIVE Final    Comment: (NOTE) SARS-CoV-2 target nucleic acids are NOT DETECTED.  The SARS-CoV-2 RNA is generally detectable in upper and lower respiratory specimens during the acute phase of infection. Negative results do not preclude SARS-CoV-2 infection, do not rule out co-infections with other pathogens, and should not be used as the sole basis for treatment or other patient management decisions. Negative results must be combined with clinical observations, patient history, and epidemiological information. The expected result is Negative.  Fact Sheet for Patients: 02/19/20  Fact Sheet for Healthcare Providers: 02/20/20  This test is not yet approved or cleared by the [employer] FDA and  has been authorized for detection and/or diagnosis of SARS-CoV-2 by FDA under an  Emergency Use Authorization (EUA). This EUA will remain  in effect (meaning this test can be used) for the duration of the COVID-19 declaration under Se ction 564(b)(1) of the Act, 21 U.S.C. section 360bbb-3(b)(1), unless the authorization is terminated or revoked sooner.  Performed at [facility], [address] [place], [place] [address]   MRSA PCR Screening     Status: None   Collection Time: 02/18/20  8:30 PM   Specimen: Nasopharyngeal  Result Value Ref Range Status   MRSA by PCR NEGATIVE NEGATIVE Final    Comment:        The GeneXpert MRSA Assay (FDA approved for NASAL specimens only), is one component of a comprehensive MRSA colonization surveillance program. It is not intended to diagnose MRSA infection nor to guide or monitor treatment for MRSA infections. Performed at [facility], [address] [place], [place] [address]      Radiology Studies: CT Head Wo Contrast  Result Date: 02/18/2020 CLINICAL DATA:  Altered mental status. EXAM: CT HEAD WITHOUT CONTRAST TECHNIQUE: Contiguous axial images were obtained from the base of the skull through the vertex without intravenous contrast. COMPARISON:  December 13, 2018 FINDINGS: The study is markedly limited secondary to patient motion. Brain: No evidence of acute infarction, hemorrhage, hydrocephalus, extra-axial collection or mass lesion/mass effect. Vascular: No hyperdense vessel or unexpected calcification. Skull: Normal. Negative for fracture or focal lesion. Sinuses/Orbits: No acute finding. Other: None. IMPRESSION: 1. Markedly limited study secondary to patient motion. 2. No acute intracranial abnormality. Electronically Signed   By: Aram Candelahaddeus  Houston M.D.   On: 02/18/2020 02:01   DG Chest Port 1 View  Result Date: 02/17/2020 CLINICAL DATA:  Altered mental status. EXAM: PORTABLE CHEST 1 VIEW COMPARISON:  Radiograph 12/19/2018 FINDINGS: Mild chronic elevation of right hemidiaphragm.The  cardiomediastinal contours are normal. The lungs are clear. Pulmonary vasculature is normal. No consolidation, pleural effusion, or pneumothorax. No acute osseous abnormalities are seen. IMPRESSION: No acute chest findings. Electronically Signed   By: Narda RutherfordMelanie  Sanford M.D.   On: 02/17/2020 23:49   EEG adult  Result Date: 02/18/2020 Charlsie QuestYadav, Priyanka O, MD     02/18/2020  3:19 PM Patient Name: Lucas White MRN: [id number] Epilepsy Attending: Charlsie QuestPriyanka O Yadav Referring Physician/Provider: Zenia ResidesPeter Babcock, NP Date: 02/18/2020 Duration: 23.16 mins Patient history: 59yo M with ams. EEG to evaluate for seizure Level of alertness: Awake, asleep AEDs during EEG study: GBP, LRZ Technical aspects: This EEG study was done with scalp electrodes positioned according to the 10-20 International system of electrode placement. Electrical activity was acquired at a sampling rate of 500Hz  and reviewed with a high frequency filter of 70Hz  and a low frequency filter of 1Hz . EEG data were recorded continuously and digitally stored. Description: The posterior dominant rhythm consists of 8 Hz activity of moderate voltage (25-35 uV) seen predominantly in posterior head regions, symmetric and reactive to eye opening and eye closing. Sleep was characterized by vertex waves, sleep spindles (12 to 14 Hz), maximal frontocentral region.  EEG showed continuous generalized 5 to 7 Hz theta as well as intermittent 2-3Hz  delta slowing. Hyperventilation and photic stimulation were not performed.   ABNORMALITY -Continuous slow, generalized IMPRESSION: This study is suggestive of mild diffuse encephalopathy, nonspecific etiology. No seizures or epileptiform discharges were seen throughout the recording. Charlsie QuestPriyanka O Yadav     LOS: 1 day   Lanae Boastamesh Nini Cavan, MD [facility]  02/19/2020, 12:32 PM

## 2020-02-19 NOTE — Progress Notes (Signed)
Patient's wife phone number updated in chart.

## 2020-02-19 NOTE — TOC Initial Note (Signed)
Transition of Care [facility]) - Initial/Assessment Note    Patient Details  Name: Lucas White MRN: [id number] Date of Birth: Jan 11, 1960  Transition of Care [facility]) CM/SW Contact:    Golda Acre, RN Phone Number: 02/19/2020, 8:05 AM  Clinical Narrative:                 60 year old male with past medical history of diabetes mellitus type 2, chronic low back pain, chronic opiate use who presents to [facility] hospital emergency department due to family concerns for worsening hyperglycemia over the past several days as well as worsening confusion.  Poor historian due to acute agitation and therefore the majority the history is been obtained from discussions with the emergency department staff.  Attempts were made to contact the patient's wife but we were unsuccessful.  According to the patient's family, patient has developed increasingly worsening glycemic control over the past several days.  Additionally, patient has been exhibiting odd behavior eating excessive amounts of sweets.  This is been associated with increasing bouts of confusion and agitation.  Due to concerns over patient's worsening behavior EMS was contacted and the patient was promptly evaluated and brought to the hospital emergency department for evaluation.  Upon evaluation in the emergency department patient has been extremely agitated and not following commands.  Patient has not exhibited any focal weakness to bring about concern for acute stroke however CT imaging head was performed and was found to be unremarkable.  Urine toxicology screen was obtained and was also found to be unremarkable.  Patient was administered 2 doses of Ativan and 1 dose of Geodon without significant sedating effect.  Patient unfortunately had to eventually be placed in four-point restraints by the emergency department staff.  The hospitalist group was then called to assess patient for admission the hospital. PLAN: to return to home with family  once diabetic episode is stable.  Expected Discharge Plan: Home/Self Care Barriers to Discharge: Continued Medical Work up   Patient Goals and CMS Choice Patient states their goals for this hospitalization and ongoing recovery are:: to go home CMS Medicare.gov Compare Post Acute Care list provided to:: Patient    Expected Discharge Plan and Services Expected Discharge Plan: Home/Self Care   Discharge Planning Services: CM Consult   Living arrangements for the past 2 months: Single Family Home                                      Prior Living Arrangements/Services Living arrangements for the past 2 months: Single Family Home Lives with:: Spouse Patient language and need for interpreter reviewed:: Yes Do you feel safe going back to the place where you live?: Yes      Need for Family Participation in Patient Care: Yes (Comment) Care giver support system in place?: Yes (comment)   Criminal Activity/Legal Involvement Pertinent to Current Situation/Hospitalization: No - Comment as needed  Activities of Daily Living Home Assistive Devices/Equipment: CBG Meter ADL Screening (condition at time of admission) Patient's cognitive ability adequate to safely complete daily activities?: No Is the patient deaf or have difficulty hearing?: No Does the patient have difficulty seeing, even when wearing glasses/contacts?: Yes Does the patient have difficulty concentrating, remembering, or making decisions?: Yes Patient able to express need for assistance with ADLs?: Yes Does the patient have difficulty dressing or bathing?: Yes Independently performs ADLs?: No Communication: Independent Dressing (OT): Needs assistance Is this  a change from baseline?: Change from baseline, expected to last <3days Grooming: Needs assistance Is this a change from baseline?: Change from baseline, expected to last <3 days Feeding: Needs assistance Is this a change from baseline?: Change from baseline,  expected to last <3 days Bathing: Needs assistance Is this a change from baseline?: Change from baseline, expected to last <3 days Toileting: Needs assistance Is this a change from baseline?: Change from baseline, expected to last <3 days In/Out Bed: Needs assistance Is this a change from baseline?: Change from baseline, expected to last <3 days Walks in Home: Needs assistance Is this a change from baseline?: Change from baseline, expected to last <3 days Does the patient have difficulty walking or climbing stairs?: Yes (secondary to weakness) Weakness of Legs: Both Weakness of Arms/Hands: None  Permission Sought/Granted                  Emotional Assessment Appearance:: Appears stated age Attitude/Demeanor/Rapport: Unable to Assess Affect (typically observed): Unable to Assess Orientation: : Fluctuating Orientation (Suspected and/or reported Sundowners) Alcohol / Substance Use: Not Applicable Psych Involvement: No (comment)  Admission diagnosis:  Delirium [R41.0] Altered mental status [R41.82] Hyperglycemia [R73.9] Confusion and disorientation [R41.0] Acute metabolic encephalopathy [G93.41] Patient Active Problem List   Diagnosis Date Noted  . Hypokalemia 02/18/2020  . Chronic pain syndrome 02/18/2020  . Acute metabolic encephalopathy 02/18/2020  . Volume depletion 12/19/2018  . Confusion and disorientation 12/19/2018  . Type 2 diabetes mellitus with hyperglycemia, with long-term current use of insulin (HCC)   . Chronic back pain greater than 3 months duration   . Obesity (BMI 30.0-34.9)    PCP:  [name], MD Pharmacy:   [facility] [phone] [place], [place] - [address] [facility] [address] [place] [address] Phone: [phone] Fax: [phone]     Social Determinants of Health (SDOH) Interventions    Readmission Risk Interventions No flowsheet data found.

## 2020-02-20 DIAGNOSIS — R410 Disorientation, unspecified: Secondary | ICD-10-CM | POA: Diagnosis not present

## 2020-02-20 LAB — COMPREHENSIVE METABOLIC PANEL
ALT: 18 U/L (ref 0–44)
AST: 23 U/L (ref 15–41)
Albumin: 3.6 g/dL (ref 3.5–5.0)
Alkaline Phosphatase: 99 U/L (ref 38–126)
Anion gap: 11 (ref 5–15)
BUN: 7 mg/dL (ref 6–20)
CO2: 20 mmol/L — ABNORMAL LOW (ref 22–32)
Calcium: 8.8 mg/dL — ABNORMAL LOW (ref 8.9–10.3)
Chloride: 108 mmol/L (ref 98–111)
Creatinine, Ser: 0.84 mg/dL (ref 0.61–1.24)
GFR, Estimated: >60 mL/min (ref >60)
Glucose, Bld: 119 mg/dL — ABNORMAL HIGH (ref 70–99)
Potassium: 3.3 mmol/L — ABNORMAL LOW (ref 3.5–5.1)
Sodium: 139 mmol/L (ref 135–145)
Total Bilirubin: 1.1 mg/dL (ref 0.3–1.2)
Total Protein: 6.9 g/dL (ref 6.5–8.1)

## 2020-02-20 LAB — CBC
HCT: 38.4 % — ABNORMAL LOW (ref 39.0–52.0)
Hemoglobin: 12.9 g/dL — ABNORMAL LOW (ref 13.0–17.0)
MCH: 29.6 pg (ref 26.0–34.0)
MCHC: 33.6 g/dL (ref 30.0–36.0)
MCV: 88.1 fL (ref 80.0–100.0)
Platelets: 416 10*3/uL — ABNORMAL HIGH (ref 150–400)
RBC: 4.36 MIL/uL (ref 4.22–5.81)
RDW: 12.4 % (ref 11.5–15.5)
WBC: 8.2 10*3/uL (ref 4.0–10.5)
nRBC: 0 % (ref 0.0–0.2)

## 2020-02-20 LAB — GLUCOSE, CAPILLARY
Glucose-Capillary: 173 mg/dL — ABNORMAL HIGH (ref 70–99)
Glucose-Capillary: 159 mg/dL — ABNORMAL HIGH (ref 70–99)
Glucose-Capillary: 117 mg/dL — ABNORMAL HIGH (ref 70–99)
Glucose-Capillary: 200 mg/dL — ABNORMAL HIGH (ref 70–99)

## 2020-02-20 LAB — MAGNESIUM: Magnesium: 1.9 mg/dL (ref 1.7–2.4)

## 2020-02-20 MED ORDER — SODIUM CHLORIDE 0.9 % IV SOLN
INTRAVENOUS | Status: DC | PRN
  Administered 2020-02-20: 1000 mL via INTRAVENOUS

## 2020-02-20 MED ORDER — POTASSIUM CHLORIDE 10 MEQ/100ML IV SOLN
10.0000 meq | INTRAVENOUS | Status: AC
  Administered 2020-02-20 (×4): 10 meq via INTRAVENOUS
  Filled 2020-02-20 (×4): qty 100

## 2020-02-20 MED ORDER — LORAZEPAM 2 MG/ML IJ SOLN
2.0000 mg | INTRAMUSCULAR | Status: DC | PRN
  Administered 2020-02-20 – 2020-02-21 (×3): 2 mg via INTRAVENOUS
  Filled 2020-02-20 (×4): qty 1

## 2020-02-20 MED ORDER — THIAMINE HCL 100 MG PO TABS
100.0000 mg | ORAL_TABLET | Freq: Every day | ORAL | Status: DC
  Administered 2020-02-22 – 2020-02-23 (×2): 100 mg via ORAL
  Filled 2020-02-20 (×3): qty 1

## 2020-02-20 MED ORDER — MAGNESIUM SULFATE 2 GM/50ML IV SOLN
2.0000 g | Freq: Once | INTRAVENOUS | Status: AC
  Administered 2020-02-20: 2 g via INTRAVENOUS
  Filled 2020-02-20: qty 50

## 2020-02-20 MED ORDER — POTASSIUM CHLORIDE CRYS ER 20 MEQ PO TBCR
20.0000 meq | EXTENDED_RELEASE_TABLET | ORAL | Status: AC
  Administered 2020-02-20 (×2): 20 meq via ORAL
  Filled 2020-02-20 (×2): qty 1

## 2020-02-20 MED ORDER — OLANZAPINE 5 MG PO TBDP
5.0000 mg | ORAL_TABLET | Freq: Every day | ORAL | Status: DC
  Administered 2020-02-20: 5 mg via ORAL
  Filled 2020-02-20: qty 1

## 2020-02-20 MED ORDER — OLANZAPINE 5 MG PO TBDP
2.5000 mg | ORAL_TABLET | Freq: Every day | ORAL | Status: DC
  Administered 2020-02-20: 2.5 mg via ORAL
  Filled 2020-02-20 (×2): qty 0.5

## 2020-02-20 NOTE — Consult Note (Signed)
60 year old male with past medical history of diabetes mellitus type 2, chronic low back pain, chronic opiate use who presents to [facility] hospital emergency department due to family concerns for worsening hyperglycemia over the past several days as well as worsening confusion.   Poor historian due to acute agitation and therefore the majority the history is been obtained from discussions with the emergency department staff.  Attempts were made to contact the patient's wife but we were unsuccessful.  According to the patient's family, patient has developed increasingly worsening glycemic control over the past several days.  Additionally, patient has been exhibiting odd behavior eating excessive amounts of sweets.  This is been associated with increasing bouts of confusion and agitation.  Due to concerns over patient's worsening behavior EMS was contacted and the patient was promptly evaluated and brought to the hospital emergency department for evaluation.   Patient was seen and assessed, unable to participate in psychiatric evaluation due to altered mental status, disorientation, and altered sensorium.  Patient appears to be very restless, slight agitation, and continues to be in two-point wrist restraints.  Patient is alert and oriented to self only.  He is observed to be very confused " [name] bring me a knife."  Further clarification was sought as to what he plan to do with a knife patient replies" I need to cut something."  Patient begins to ruminate about his cereal, when asked if he knows where he is located he states" bring me that cereal."    Patient continues to exhibit ongoing confusion, agitation, and difficulty following commands.  Current work-up has been unremarkable thus far.  Recent EEG obtained does show diffuse encephalopathy.  Patient appears to be responding to IV Haldol, after attempts to administer Ativan IV and Geodon IM x1.  Per nursing staff patient remains confused,  agitated, and combative at times this morning.  Due to his altered mental status, we are going to attempt to administer oral medication.  Patient may require speech evaluation to see if he is able to swallow.  If patient is successful in swallowing his medication, will initiate Zyprexa Zydis 2.5 mg every morning, and Zyprexa Zydis 5 mg p.o. nightly to further target confusion, disorientation, and agitation aggression.  His encephalopathy cause appears to be unknown, however he did present in a similar manner 1 year ago secondary to COVID-19 pneumonia.  His encephalopathy is likely caused by his uncontrolled diabetes, multiple medications (in which he appears to not take as prescribed).  Patient also has a history of chronic back pain, in which she is prescribed oxycodone 20 mg p.o. every 6.  Urine drug screen was negative for opiates on admission.   -Will continue to monitor on psychiatric service until patient shows improvement. -Will initiate Zyprexa Zydis 2.5 mg p.o. every morning and Zyprexa Zydis 5 mg p.o. nightly to further target confusion, disorientation, agitation, and aggression.  In the event he is able to tolerate oral medication, will need to switch his Ativan to Benadryl or hydroxyzine.  Recommend not administering Zyprexa and Ativan concurrently, due to counter interactions. -EKG obtained yesterday shows QTC of 453.  TSH is 0.319, hyperthyroidism can present as an affective psychosis although his T3 and free T4 are within normal.  -Recommend neurology consult to identify any other organic causes for altered mental status.  Writer did initiate contact with [facility] clinic, patient has been exhibiting behaviors of confusion and questionable drug-seeking behaviors.  Writer also [facility], the patient has  to be willing to detox and or going to substance abuse facility.  She verbalized understanding.  Chart review does indicate patient has received several imaging in relation  to his chronic back pain. He has not been evaluated for dementia. This does appear to be ongoing for several years also should consider his uncontrolled diabetes, can lead to early cognitive decline and impairment.  A1c 12.6.

## 2020-02-20 NOTE — Progress Notes (Signed)
NAME:  Lucas White, MRN:  [id number], DOB:  [birth year], LOS: 2 ADMISSION DATE:  02/17/2020, CONSULTATION DATE:  2/16 REFERRING MD:  [place], CHIEF COMPLAINT:  Delirium    Brief History:  60 year old male who presented via EMS to [facility] w/ several day h/o poor glycemic control (increased hyperglycemia)odd behavior (eating more sweets than usual) and increased bouts of confusion and agitation. EMS called as this behavior became more escalated and persistent. In ER confused. CT neg, WBC nml, no nuchal rigidity. Initial urine tox + for benzos (but had gotten ativan prior). Received ativan total of 8.5 mg total from 2236 on 2/15 to 2/16 at 1300, geodon 20mg  at 0400 on 2/14. Apparently not responding to ativan per nursing staff. IV haldol given 5 mg IV w/ excellent initial results. Pt went from being agitated and confused to resting comfortably and sedated. PCCM asked to eval for possible precedex need.   History of Present Illness:  As above   Past Medical History:  DM type II, HTN, chronic low back pain on opiates and neurontin, had delirium in prior hospital stay w/ weakly + RPR   Significant Hospital Events:  2/15 admitted CT head neg.  2/16  Received ativan total of 8.5 mg total from 2236 on 2/15 to 2/16 at 1300, geodon 20mg  at 0400 on 2/14. Apparently not responding to ativan per nursing staff. IV haldol given 5 mg IV w/ excellent initial results. Consults:   Procedures:    Significant Diagnostic Tests:  CT head 2/16 low quality due to motion but normal Micro Data:    Antimicrobials:     Interim History / Subjective:  Still confused but more awake   Objective   Blood pressure 130/81, pulse 86, temperature 98.2 F (36.8 C), temperature source Axillary, resp. rate (Abnormal) 27, height 5\' 11"  (1.803 m), weight 90 kg, SpO2 96 %.        Intake/Output Summary (Last 24 hours) at 02/20/2020 1026 Last data filed at 02/20/2020 0800 Gross per 24 hour  Intake 1946.93 ml  Output 1200 ml   Net 746.93 ml   Filed Weights   02/17/20 2117  Weight: 90 kg    Examination: General 60 year old male resting in bed. He is impulsive and not easy to redirect  HENT NCAT no JVD pulm clear still on room air Card rrr abd soft not tender Ext warm and dry  Neuro awake and oriented x 1. Moves all ext. Confused and agitated at times.    Resolved Hospital Problem list     Assessment & Plan:   Acute Metabolic Encephalopathy Agitated delirium  Type II DM w/ hyperglycemia  H/o HTN  Mild hypokalemia  Mild anemia   Acute metabolic encephalopathy.   Etiology still not entirely clear. I do not think this is CNS infection, initially questioned hypertensive encephalopathy but his waxing and waning agitation doesn't seem consistent with this. His EEG was negative. This would not rule out post-ictal encephalopathy but would have expected this to resolve by now it that were the case. Most likely we are dealing with some sort of drug withdrawal and residual metabolic derangements from hyperglycemia when he came in. He had been on neurontin for some time. Not sure that he was taking as prescribed. Neurontin w/d might explain this if not ETOH related.   Plan Agree w/ zyprexa PRN haldol Scheduled Neurontin  Cont lexapro Cont haldol.  Cont PRN ativan  Do not think adding precedex will fix anything and we  seem to be slowly improving with current rx.  We are available if needed. At this point nothing to add.    Simonne Martinet ACNP-BC [facility] Pulmonary/Critical Care Pager # [phone] OR # [phone] if no answer

## 2020-02-20 NOTE — Progress Notes (Addendum)
Patient has had frequent severe mood swings and has been confused/forgetful throughout the day. He is constantly trying to get OOB. Staff can sometimes talk him down but he is frequently verbally aggressive. Has asked for a gun and a knife to free himself from restraints. This PM pt was hallucinating and stating, "Somebody! There is a baby in my room next to my bed crying. Someone come and get the baby". RN attempted to work with him and tried to remove the restraints but the patient continued to pose a danger to himself and to staff so ankle restraints were resumed.

## 2020-02-20 NOTE — Progress Notes (Signed)
[facility] ADULT ICU REPLACEMENT PROTOCOL   The patient does apply for the [facility] Adult ICU Electrolyte Replacment Protocol based on the criteria listed below:   1. Is GFR >/= 30 ml/min? Yes.    Patient's GFR today is >60 2. Is SCr </= 2? Yes.   Patient's SCr is 0.84 ml/kg/hr 3. Did SCr increase >/= 0.5 in 24 hours? No. 4. Abnormal electrolyte(s): k 3.3, mag 1.9 5. Ordered repletion with:  6. If a panic level lab has been reported, has the CCM MD in charge been notified? No..   Physician:    Markus Daft A 02/20/2020 5:21 AM

## 2020-02-20 NOTE — Progress Notes (Signed)
PHARMACIST - PHYSICIAN COMMUNICATION  DR:   Jonathon Bellows  CONCERNING: IV to Oral Route Change Policy  RECOMMENDATION: This patient is receiving thiamine by the intravenous route.  Based on criteria approved by the Pharmacy and Therapeutics Committee, the intravenous medication(s) is/are being converted to the equivalent oral dose form(s) starting 2/19.   DESCRIPTION: These criteria include:  The patient is eating (either orally or via tube) and/or has been taking other orally administered medications for a least 24 hours  The patient has no evidence of active gastrointestinal bleeding or impaired GI absorption (gastrectomy, short bowel, patient on TNA or NPO).  If you have questions about this conversion, please contact the Pharmacy Department   Loralee Pacas, PharmD, BCPS Pharmacy: [phone] 02/20/2020 1:56 PM

## 2020-02-20 NOTE — Progress Notes (Addendum)
PROGRESS NOTE    CORYDON SCHWEISS  VOZ:[id number] DOB: [birth year] DOA: 02/17/2020 PCP: [name], MD   Chief Complaint  Patient presents with  . Hyperglycemia   Brief Narrative: 57 yom w/T2DM,chronic low back pain,chronic opiate use brought to ED w/lethargy, confusion over the past few days.   As per report chart-patient was significantly altered and much of the HPI obtained from chart review, ED/EMS report.  Apparently patient has been noncompliant over the last few days in regards to his home medications and intaking high amounts of carbohydrates. In the ED,temp 98.1, HR 54, RR 20, BP 194/86, SPO2 98% on room air.  VBG with pH 7.47/PaCO2 33.4/PaO2 42.8.  Sodium 136, potassium 3.3,chloride 101, CO2 24, glucose 414, BUN 7, creatinine 0.90.  AST 22, ALT 18, total bilirubin 0.5.Lactic acid 2.2.  WBC 10.0, hemoglobin 12.4, platelets 410.  Urinalysis with negative nitrite, negative leukocytes, negative ketones, and no bacteria seen.EtOH level <10.UDS positive for benzos.QQ5-95. CXR w/ no acute cardiopulmonary disease process. CT head without contrast limited study secondary to motion but no acute intracranial abnormality appreciated.Patient was given Ativan, droperidol due to increasing agitation and placed in restraints. He was moved to step down and ICU was consulted due to ongoing agitation and responded well to haldol.   Subjective:  Afebrile overnight Potassium 3.3 Renal fun/lfts  Stable Patient Nurse spoke to his wife yesterday as she made a contact to hospital yesterday and she stated "Nussen is an ugly man, and he's been confused like this for months now." He takes "medications wrong" that she tried to help him with that but that she got cussed out. This morning somewhat agitated,able to tell his name and that he is in hospital- asking if his  wife has called and if she was aware that she is in the hospital  Assessment & Plan:  Acute encephalopathy likely toxic versus metabolic, ?  Drug-opiate withdrawal/alcohol withdrawal.  On Neurontin at home wonder if if he stopped abruptly.  So far work-up shows no evidence of infection, less likely CNS infection-afebrile no leukocytosis, serum osmol normal, UDS positive for benzo (it appears it was after receiving benzo in the ED), EEG no evidence of seizure-shows encephalopathic state, tsh low 0.3, FT4 nl, [id number], folate 7.6, CK normal, RFT/LFTs normal,  HIV non reactive. As per the wife patient has been "confused like this for months"-has had similar behavior, was seen in the ED December 2020-and seems to focus on his chronic pain opiate use personality seems. Consulted/discussed with neurologist Dr. Napoleon Form advised psych eval, pain management. Cont prn Haldol/Ativan followed by PCCM. Continue supportive care, resumed on low dose oxycodone ( he is on 20 mg at home). Psych seeing today.  Nursing to monitor give his p.o. meds safely, can feed if he remains awake,with assistance.  Type 2 DM, with poorly controlled hemoglobin A1c 12.6, uncontrolled hyperglycemia on admission. Blood sugar well controlled, continue on gentle fluids, CBG monitoring.  Recent Labs  Lab 02/18/20 2103 02/19/20 0750 02/19/20 1148 02/19/20 1633 02/19/20 2101  GLUCAP 129* 155* 138* 156* 95   Chronic Pain syndrome, on Neurontin 600 mg 3 times daily-continue the same as tolerated/as able to take po. Anxiety/Depression: cont his Lexapro Hypokalemia: Repleted overnight Essential Hypertension: BP fairly stable, continue with Cardizem, opr nlabetalol Overweight w/ BMI 27: Weight loss strategy as outpatient  Diet Order            Diet NPO time specified  Diet effective now  Pt's Body mass index is 27.67 kg/m.  DVT prophylaxis: enoxaparin (LOVENOX) injection 40 mg Start: 02/18/20 1000 Code Status:   Code Status: Full Code  Family Communication: discussed w/ neuro, pccm, RN. I called his wife Alberta's phone no that RN were able to update from  yeaterday's call- but no answer.  Status [id number] Remains inpatient appropriate because:Altered mental status, Ongoing diagnostic testing needed not appropriate for outpatient work up and Unsafe d/c plan  Dispo:  Patient From: Home  Planned Disposition: TBD  Expected discharge date: 02/21/2020  Medically stable for discharge: No   Consultants:see note  Procedures:see note  Unresulted Labs (From admission, onward)          Start     Ordered   02/19/20 0500  CBC  Daily,   R      02/18/20 1741   02/19/20 0500  Comprehensive metabolic panel  Daily,   R      02/18/20 1741   02/19/20 0500  Magnesium  Daily,   R      02/18/20 1741          Culture/Microbiology    Component Value Date/Time   SDES SPUTUM 12/19/2018 1053   SDES  12/19/2018 1053    SPUTUM Performed at [facility], [address] [place], [place] [address]    SPECREQUEST Immunocompromised 12/19/2018 1053   SPECREQUEST  12/19/2018 1053    Immunocompromised Reflexed from [id number] Performed at [facility], [address], [place], [place] [address]    CULT  12/19/2018 1053    FEW STAPHYLOCOCCUS AUREUS MODERATE HAEMOPHILUS INFLUENZAE BETA LACTAMASE NEGATIVE Performed at [facility], [address] [place], [place] [address]    REPTSTATUS 12/19/2018 FINAL 12/19/2018 1053   REPTSTATUS 12/22/2018 FINAL 12/19/2018 1053    Other culture-see note  Medications: Scheduled Meds: . chlorhexidine  15 mL Mouth Rinse BID  . Chlorhexidine Gluconate Cloth  6 each Topical Daily  . diltiazem  180 mg Oral Daily  . enoxaparin (LOVENOX) injection  40 mg Subcutaneous Q24H  . escitalopram  20 mg Oral Daily  . gabapentin  600 mg Oral TID  . insulin aspart  0-15 Units Subcutaneous TID AC & HS  . mouth rinse  15 mL Mouth Rinse q12n4p  . potassium chloride  20 mEq Oral Q4H  . thiamine injection  100 mg Intravenous Daily   Continuous Infusions: . sodium chloride Stopped  (02/20/20 0611)  . potassium chloride 100 mL/hr at 02/20/20 0645    Antimicrobials: Anti-infectives (From admission, onward)   None     Objective: Vitals: Today's Vitals   02/20/20 0400 02/20/20 0500 02/20/20 0528 02/20/20 0550  BP: (!) 142/91 (!) 155/140    Pulse: 87 100 (!) 106   Resp: (!) 23 (!) 27 (!) 25   Temp:      TempSrc:      SpO2: 98% 100% 99%   Weight:      Height:      PainSc:    5     Intake/Output Summary (Last 24 hours) at 02/20/2020 0737 Last data filed at 02/20/2020 0645 Gross per 24 hour  Intake 1806.25 ml  Output 1200 ml  Net 606.25 ml   Filed Weights   02/17/20 2117  Weight: 90 kg   Weight change:   Intake/Output from previous day: 02/17 0701 - 02/18 0700 In: 1806.3 [I.V.:1718.6; IV Piggyback:87.7] Out: 1200 [Urine:1200] Intake/Output this shift: No intake/output data recorded. Filed Weights   02/17/20 2117  Weight: 90 kg    Examination: General exam: Alert awake oriented to self, place, mildly agitated needing restraint HEENT:Oral mucosa moist, Ear/Nose WNL grossly, dentition normal. Respiratory system: bilaterally clear, no wheezing or crackles,no use of accessory muscle Cardiovascular system: S1 & S2 +, No JVD,. Gastrointestinal system: Abdomen soft, NT,ND, BS+ Nervous System:Alert, awake, moving extremities and grossly nonfocal Extremities: No edema, distal peripheral pulses palpable.  Skin: No rashes,no icterus. MSK: Normal muscle bulk,tone, power  Data Reviewed: I have personally reviewed following labs and imaging studies CBC: Recent Labs  Lab 02/17/20 2200 02/17/20 2208 02/18/20 0420 02/19/20 0241 02/20/20 0228  WBC 10.0  --  9.6 10.4 8.2  NEUTROABS 6.7  --  6.0  --   --   HGB 12.4* 12.9* 12.5* 12.6* 12.9*  HCT 36.8* 38.0* 36.8* 38.9* 38.4*  MCV 87.0  --  87.2 90.0 88.1  PLT 410*  --  398 369 416*   Basic Metabolic Panel: Recent Labs  Lab 02/17/20 2200 02/17/20 2208 02/18/20 0420 02/19/20 0241 02/20/20 0228   NA 136 138 140 141 139  K 3.3* 3.4* 3.4* 3.8 3.3*  CL 101 101 104 109 108  CO2 24  --  27 23 20*  GLUCOSE 414* 414* 177* 135* 119*  BUN 7 4* 6 8 7   CREATININE 0.90 0.70 0.81 0.76 0.84  CALCIUM 9.1  --  9.0 8.9 8.8*  MG  --   --  2.1 1.9 1.9   GFR: Estimated Creatinine Clearance: 103.3 mL/min (by C-G formula based on SCr of 0.84 mg/dL). Liver Function Tests: Recent Labs  Lab 02/17/20 2200 02/18/20 0420 02/19/20 0241 02/20/20 0228  AST 22 21 25 23   ALT 18 18 17 18   ALKPHOS 111 104 107 99  BILITOT 0.5 0.7 1.0 1.1  PROT 7.5 7.3 6.9 6.9  ALBUMIN 4.0 3.9 3.8 3.6   Recent Labs  Lab 02/17/20 2200  LIPASE 40   Recent Labs  Lab 02/18/20 0420  AMMONIA 22   Coagulation Profile: No results for input(s): INR, PROTIME in the last 168 hours. Cardiac Enzymes: Recent Labs  Lab 02/18/20 1520  CKTOTAL 300  CKMB 4.7   BNP (last 3 results) No results for input(s): PROBNP in the last 8760 hours. HbA1C: Recent Labs    02/18/20 0420  HGBA1C 12.6*   CBG: Recent Labs  Lab 02/18/20 2103 02/19/20 0750 02/19/20 1148 02/19/20 1633 02/19/20 2101  GLUCAP 129* 155* 138* 156* 95   Lipid Profile: No results for input(s): CHOL, HDL, LDLCALC, TRIG, CHOLHDL, LDLDIRECT in the last 72 hours. Thyroid Function Tests: Recent Labs    02/18/20 0420  TSH 0.319*  FREET4 1.03   Anemia Panel: Recent Labs    02/18/20 0420  VITAMINB12 1,318*  FOLATE 7.6   Sepsis Labs: Recent Labs  Lab 02/17/20 2200  LATICACIDVEN 2.2*    Recent Results (from the past 240 hour(s))  SARS CORONAVIRUS 2 (TAT 6-24 HRS) Nasopharyngeal Nasopharyngeal Swab     Status: None   Collection Time: 02/18/20  2:25 AM   Specimen: Nasopharyngeal Swab  Result Value Ref Range Status   SARS Coronavirus 2 NEGATIVE NEGATIVE Final    Comment: (NOTE) SARS-CoV-2 target nucleic acids are NOT DETECTED.  The SARS-CoV-2 RNA is generally detectable in upper and lower respiratory specimens during the acute phase of  infection. Negative results do not preclude SARS-CoV-2 infection, do not rule out co-infections with other pathogens, and should not be used as the sole basis for treatment or other patient management  decisions. Negative results must be combined with clinical observations, patient history, and epidemiological information. The expected result is Negative.  Fact Sheet for Patients: HairSlick.nohttps://www.fda.gov/media/138098/download  Fact Sheet for Healthcare Providers: quierodirigir.comhttps://www.fda.gov/media/138095/download  This test is not yet approved or cleared by the [place] FDA and  has been authorized for detection and/or diagnosis of SARS-CoV-2 by FDA under an Emergency Use Authorization (EUA). This EUA will remain  in effect (meaning this test can be used) for the duration of the COVID-19 declaration under Se ction 564(b)(1) of the Act, 21 U.S.C. section 360bbb-3(b)(1), unless the authorization is terminated or revoked sooner.  Performed at [facility], [address] [place], [place] [address]   MRSA PCR Screening     Status: None   Collection Time: 02/18/20  8:30 PM   Specimen: Nasopharyngeal  Result Value Ref Range Status   MRSA by PCR NEGATIVE NEGATIVE Final    Comment:        The GeneXpert MRSA Assay (FDA approved for NASAL specimens only), is one component of a comprehensive MRSA colonization surveillance program. It is not intended to diagnose MRSA infection nor to guide or monitor treatment for MRSA infections. Performed at [facility], [address] [place], [place] [address]      Radiology Studies: EEG adult  Result Date: 02/18/2020 Charlsie QuestYadav, Priyanka O, MD     02/18/2020  3:19 PM Patient Name: Lesli Albeendrew J Maute MRN: [id number] Epilepsy Attending: Charlsie QuestPriyanka O Yadav Referring Physician/Provider: Zenia ResidesPeter Babcock, NP Date: 02/18/2020 Duration: 23.16 mins Patient history: 59yo M with ams. EEG to evaluate for seizure Level of alertness: Awake, asleep AEDs  during EEG study: GBP, LRZ Technical aspects: This EEG study was done with scalp electrodes positioned according to the 10-20 International system of electrode placement. Electrical activity was acquired at a sampling rate of 500Hz  and reviewed with a high frequency filter of 70Hz  and a low frequency filter of 1Hz . EEG data were recorded continuously and digitally stored. Description: The posterior dominant rhythm consists of 8 Hz activity of moderate voltage (25-35 uV) seen predominantly in posterior head regions, symmetric and reactive to eye opening and eye closing. Sleep was characterized by vertex waves, sleep spindles (12 to 14 Hz), maximal frontocentral region.  EEG showed continuous generalized 5 to 7 Hz theta as well as intermittent 2-3Hz  delta slowing. Hyperventilation and photic stimulation were not performed.   ABNORMALITY -Continuous slow, generalized IMPRESSION: This study is suggestive of mild diffuse encephalopathy, nonspecific etiology. No seizures or epileptiform discharges were seen throughout the recording. Charlsie QuestPriyanka O Yadav     LOS: 2 days   Lanae Boastamesh Maynard David, MD [facility]  02/20/2020, 7:37 AM

## 2020-02-21 DIAGNOSIS — R451 Restlessness and agitation: Secondary | ICD-10-CM

## 2020-02-21 DIAGNOSIS — G894 Chronic pain syndrome: Secondary | ICD-10-CM | POA: Diagnosis not present

## 2020-02-21 DIAGNOSIS — R410 Disorientation, unspecified: Secondary | ICD-10-CM | POA: Diagnosis not present

## 2020-02-21 DIAGNOSIS — E1165 Type 2 diabetes mellitus with hyperglycemia: Secondary | ICD-10-CM | POA: Diagnosis not present

## 2020-02-21 DIAGNOSIS — F419 Anxiety disorder, unspecified: Secondary | ICD-10-CM | POA: Diagnosis not present

## 2020-02-21 DIAGNOSIS — F32A Depression, unspecified: Secondary | ICD-10-CM

## 2020-02-21 DIAGNOSIS — I10 Essential (primary) hypertension: Secondary | ICD-10-CM

## 2020-02-21 LAB — GLUCOSE, CAPILLARY
Glucose-Capillary: 154 mg/dL — ABNORMAL HIGH (ref 70–99)
Glucose-Capillary: 124 mg/dL — ABNORMAL HIGH (ref 70–99)
Glucose-Capillary: 148 mg/dL — ABNORMAL HIGH (ref 70–99)
Glucose-Capillary: 101 mg/dL — ABNORMAL HIGH (ref 70–99)

## 2020-02-21 LAB — CBC
HCT: 37.3 % — ABNORMAL LOW (ref 39.0–52.0)
Hemoglobin: 12.3 g/dL — ABNORMAL LOW (ref 13.0–17.0)
MCH: 29.6 pg (ref 26.0–34.0)
MCHC: 33 g/dL (ref 30.0–36.0)
MCV: 89.7 fL (ref 80.0–100.0)
Platelets: 416 10*3/uL — ABNORMAL HIGH (ref 150–400)
RBC: 4.16 MIL/uL — ABNORMAL LOW (ref 4.22–5.81)
RDW: 12.5 % (ref 11.5–15.5)
WBC: 8.4 10*3/uL (ref 4.0–10.5)
nRBC: 0 % (ref 0.0–0.2)

## 2020-02-21 LAB — COMPREHENSIVE METABOLIC PANEL
ALT: 20 U/L (ref 0–44)
AST: 27 U/L (ref 15–41)
Albumin: 3.8 g/dL (ref 3.5–5.0)
Alkaline Phosphatase: 94 U/L (ref 38–126)
Anion gap: 10 (ref 5–15)
BUN: 8 mg/dL (ref 6–20)
CO2: 20 mmol/L — ABNORMAL LOW (ref 22–32)
Calcium: 9 mg/dL (ref 8.9–10.3)
Chloride: 110 mmol/L (ref 98–111)
Creatinine, Ser: 0.82 mg/dL (ref 0.61–1.24)
GFR, Estimated: >60 mL/min (ref >60)
Glucose, Bld: 89 mg/dL (ref 70–99)
Potassium: 3.6 mmol/L (ref 3.5–5.1)
Sodium: 140 mmol/L (ref 135–145)
Total Bilirubin: 0.8 mg/dL (ref 0.3–1.2)
Total Protein: 7.1 g/dL (ref 6.5–8.1)

## 2020-02-21 LAB — MAGNESIUM: Magnesium: 2.3 mg/dL (ref 1.7–2.4)

## 2020-02-21 MED ORDER — DIPHENHYDRAMINE HCL 50 MG/ML IJ SOLN: 50.0000 mg | Freq: Four times a day (QID) | INTRAMUSCULAR | Status: DC | PRN

## 2020-02-21 MED ORDER — LORAZEPAM 2 MG/ML IJ SOLN
1.0000 mg | Freq: Four times a day (QID) | INTRAMUSCULAR | Status: DC | PRN
  Administered 2020-02-21 – 2020-02-23 (×4): 1 mg via INTRAVENOUS
  Filled 2020-02-21 (×4): qty 1

## 2020-02-21 MED ORDER — HALOPERIDOL LACTATE 5 MG/ML IJ SOLN
5.0000 mg | Freq: Four times a day (QID) | INTRAMUSCULAR | Status: DC | PRN
  Administered 2020-02-21: 5 mg via INTRAVENOUS
  Filled 2020-02-21: qty 1

## 2020-02-21 NOTE — Consult Note (Signed)
[facility] Face-to-Face Psychiatry Consult   Reason for Consult:  Agitation  Referring Physician:  EDP Patient Identification: Lucas White MRN:  [id number] Principal Diagnosis: Confusion and disorientation Diagnosis:  Principal Problem:   Confusion and disorientation Active Problems:   Acute metabolic encephalopathy   Type 2 diabetes mellitus with hyperglycemia, with long-term current use of insulin (HCC)   Hypokalemia   Chronic pain syndrome   Total Time spent with patient: 45 minutes  Subjective:   Lucas White is a 60 y.o. male patient admitted with AMS and agitation.  HPI: 60 year old male with past medical history of diabetes mellitus type 2, chronic low back pain, chronic opiate use who presents to [facility] emergency department due to family concerns for worsening hyperglycemia over the past several days as well as worsening confusion.   Today, he was asleep after being agitated earlier and given Haldol.  This provider spoke with his Designer, television/film set.  The RN reports continuous symptoms of confusion, agitation, AMS--not fluctuating.  Earlier he kept asking for his Xanax, not ordered nor on his PDMP.  Minimal decrease in agitation with Zyprexa.  Based on notes stating overuse of medications at times, started Haldol 5 mg BID with Ativan 1 mg BID--decreased Ativan 2 mg every six hours to 1 mg BID due to respirations at 10 on assessment.  Discontinue pain medication as he is prescribed this medication but UDS was negative for opiates.  Continue fluids and monitoring.  Per PMHNP Starkes: Poor historian due to acute agitation and therefore the majority the history is been obtained from discussions with the emergency department staff.  Attempts were made to contact the patient's wife but we were unsuccessful.  According to the patient's family, patient has developed increasingly worsening glycemic control over the past several days.  Additionally, patient has been exhibiting odd  behavior eating excessive amounts of sweets.  This is been associated with increasing bouts of confusion and agitation.  Due to concerns over patient's worsening behavior EMS was contacted and the patient was promptly evaluated and brought to the hospital emergency department for evaluation.   Patient was seen and assessed, unable to participate in psychiatric evaluation due to altered mental status, disorientation, and altered sensorium.  Patient appears to be very restless, slight agitation, and continues to be in two-point wrist restraints.  Patient is alert and oriented to self only.  He is observed to be very confused " Lucas White bring me a knife."  Further clarification was sought as to what he plan to do with a knife patient replies" I need to cut something."  Patient begins to ruminate about his cereal, when asked if he knows where he is located he states" bring me that cereal."    Patient continues to exhibit ongoing confusion, agitation, and difficulty following commands.  Current work-up has been unremarkable thus far.  Recent EEG obtained does show diffuse encephalopathy.  Patient appears to be responding to IV Haldol, after attempts to administer Ativan IV and Geodon IM x1.  Per nursing staff patient remains confused, agitated, and combative at times this morning.  Due to his altered mental status, we are going to attempt to administer oral medication.  Patient may require speech evaluation to see if he is able to swallow.  If patient is successful in swallowing his medication, will initiate Zyprexa Zydis 2.5 mg every morning, and Zyprexa Zydis 5 mg p.o. nightly to further target confusion, disorientation, and agitation aggression.  His encephalopathy cause appears to  be unknown, however he did present in a similar manner 1 year ago secondary to COVID-19 pneumonia.  His encephalopathy is likely caused by his uncontrolled diabetes, multiple medications (in which he appears to not take as  prescribed).  Patient also has a history of chronic back pain, in which she is prescribed oxycodone 20 mg p.o. every 6.  Urine drug screen was negative for opiates on admission.   -Will continue to monitor on psychiatric service until patient shows improvement. -Will initiate Zyprexa Zydis 2.5 mg p.o. every morning and Zyprexa Zydis 5 mg p.o. nightly to further target confusion, disorientation, agitation, and aggression.  In the event he is able to tolerate oral medication, will need to switch his Ativan to Benadryl or hydroxyzine.  Recommend not administering Zyprexa and Ativan concurrently, due to counter interactions. -EKG obtained yesterday shows QTC of 453.  TSH is 0.319, hyperthyroidism can present as an affective psychosis although his T3 and free T4 are within normal.  -Recommend neurology consult to identify any other organic causes for altered mental status.  Writer did initiate contact with [facility] clinic, patient has been exhibiting behaviors of confusion and questionable drug-seeking behaviors.  Writer also discussed with [facility], the patient has to be willing to detox and or going to substance abuse facility.  She verbalized understanding.  Chart review does indicate patient has received several imaging in relation to his chronic back pain. He has not been evaluated for dementia. This does appear to be ongoing for several years also should consider his uncontrolled diabetes, can lead to early cognitive decline and impairment.  A1c 12.6.   Past Psychiatric History: substance abuse  Risk to Self:  r/t confusion and agitation Risk to Others:  r/t confusion and agitation Prior Inpatient Therapy:  none Prior Outpatient Therapy:  unknown  Past Medical History:  Past Medical History:  Diagnosis Date  . Chronic back pain greater than 3 months duration   . Obesity (BMI 30.0-34.9)   . Pneumonia due to COVID-19 virus 12/19/2018  . Type 2 diabetes mellitus (HCC)     Past  Surgical History:  Procedure Laterality Date  . BACK SURGERY    . COLONOSCOPY     Family History:  Family History  Problem Relation Age of Onset  . Diabetes Father    Family Psychiatric  History: unknown Social History:  Social History   Substance and Sexual Activity  Alcohol Use No     Social History   Substance and Sexual Activity  Drug Use Never    Social History   Socioeconomic History  . Marital status: Married    Spouse name: Not on file  . Number of children: Not on file  . Years of education: Not on file  . Highest education level: Not on file  Occupational History  . Not on file  Tobacco Use  . Smoking status: Never Smoker  . Smokeless tobacco: Never Used  Vaping Use  . Vaping Use: Never used  Substance and Sexual Activity  . Alcohol use: No  . Drug use: Never  . Sexual activity: Not on file  Other Topics Concern  . Not on file  Social History Narrative  . Not on file   Social Determinants of Health   Financial Resource Strain: Not on file  Food Insecurity: Not on file  Transportation Needs: Not on file  Physical Activity: Not on file  Stress: Not on file  Social Connections: Not on file   Additional Social History:  Allergies:  No Known Allergies  Labs:  Results for orders placed or performed during the hospital encounter of 02/17/20 (from the past 48 hour(s))  Glucose, capillary     Status: Abnormal   Collection Time: 02/19/20  4:33 PM  Result Value Ref Range   Glucose-Capillary 156 (H) 70 - 99 mg/dL    Comment: Glucose reference range applies only to samples taken after fasting for at least 8 hours.  Glucose, capillary     Status: None   Collection Time: 02/19/20  9:01 PM  Result Value Ref Range   Glucose-Capillary 95 70 - 99 mg/dL    Comment: Glucose reference range applies only to samples taken after fasting for at least 8 hours.  CBC     Status: Abnormal   Collection Time: 02/20/20  2:28 AM  Result Value Ref Range   WBC 8.2  4.0 - 10.5 K/uL   RBC 4.36 4.22 - 5.81 MIL/uL   Hemoglobin 12.9 (L) 13.0 - 17.0 g/dL   HCT 40.9 (L) 81.1 - 91.4 %   MCV 88.1 80.0 - 100.0 fL   MCH 29.6 26.0 - 34.0 pg   MCHC 33.6 30.0 - 36.0 g/dL   RDW 78.2 95.6 - 21.3 %   Platelets 416 (H) 150 - 400 K/uL   nRBC 0.0 0.0 - 0.2 %    Comment: Performed at [facility], [address] [place], [place] [address]  Comprehensive metabolic panel     Status: Abnormal   Collection Time: 02/20/20  2:28 AM  Result Value Ref Range   Sodium 139 135 - 145 mmol/L   Potassium 3.3 (L) 3.5 - 5.1 mmol/L   Chloride 108 98 - 111 mmol/L   CO2 20 (L) 22 - 32 mmol/L   Glucose, Bld 119 (H) 70 - 99 mg/dL    Comment: Glucose reference range applies only to samples taken after fasting for at least 8 hours.   BUN 7 6 - 20 mg/dL   Creatinine, Ser 8.46 0.61 - 1.24 mg/dL   Calcium 8.8 (L) 8.9 - 10.3 mg/dL   Total Protein 6.9 6.5 - 8.1 g/dL   Albumin 3.6 3.5 - 5.0 g/dL   AST 23 15 - 41 U/L   ALT 18 0 - 44 U/L   Alkaline Phosphatase 99 38 - 126 U/L   Total Bilirubin 1.1 0.3 - 1.2 mg/dL   GFR, Estimated >96 >29 mL/min    Comment: (NOTE) Calculated using the CKD-EPI Creatinine Equation (2021)    Anion gap 11 5 - 15    Comment: Performed at [facility], [address], [place], [place] [address]  Magnesium     Status: None   Collection Time: 02/20/20  2:28 AM  Result Value Ref Range   Magnesium 1.9 1.7 - 2.4 mg/dL    Comment: Performed at [facility], [address] [place], [place] [address]  Glucose, capillary     Status: Abnormal   Collection Time: 02/20/20  7:46 AM  Result Value Ref Range   Glucose-Capillary 173 (H) 70 - 99 mg/dL    Comment: Glucose reference range applies only to samples taken after fasting for at least 8 hours.  Glucose, capillary     Status: Abnormal   Collection Time: 02/20/20 11:51 AM  Result Value Ref Range   Glucose-Capillary 159 (H) 70 - 99 mg/dL    Comment: Glucose  reference range applies only to samples taken after fasting for at least 8 hours.  Glucose, capillary     Status: Abnormal   Collection Time: 02/20/20  4:25 PM  Result Value Ref Range   Glucose-Capillary 117 (H) 70 - 99 mg/dL    Comment: Glucose reference range applies only to samples taken after fasting for at least 8 hours.  Glucose, capillary     Status: Abnormal   Collection Time: 02/20/20  9:24 PM  Result Value Ref Range   Glucose-Capillary 200 (H) 70 - 99 mg/dL    Comment: Glucose reference range applies only to samples taken after fasting for at least 8 hours.   Comment 1 Notify RN    Comment 2 Document in Chart   CBC     Status: Abnormal   Collection Time: 02/21/20 12:31 AM  Result Value Ref Range   WBC 8.4 4.0 - 10.5 K/uL   RBC 4.16 (L) 4.22 - 5.81 MIL/uL   Hemoglobin 12.3 (L) 13.0 - 17.0 g/dL   HCT 32.1 (L) 22.4 - 82.5 %   MCV 89.7 80.0 - 100.0 fL   MCH 29.6 26.0 - 34.0 pg   MCHC 33.0 30.0 - 36.0 g/dL   RDW 00.3 70.4 - 88.8 %   Platelets 416 (H) 150 - 400 K/uL   nRBC 0.0 0.0 - 0.2 %    Comment: Performed at [facility], [address], [place], [place] [address]  Comprehensive metabolic panel     Status: Abnormal   Collection Time: 02/21/20 12:31 AM  Result Value Ref Range   Sodium 140 135 - 145 mmol/L   Potassium 3.6 3.5 - 5.1 mmol/L   Chloride 110 98 - 111 mmol/L   CO2 20 (L) 22 - 32 mmol/L   Glucose, Bld 89 70 - 99 mg/dL    Comment: Glucose reference range applies only to samples taken after fasting for at least 8 hours.   BUN 8 6 - 20 mg/dL   Creatinine, Ser 5.03 0.61 - 1.24 mg/dL   Calcium 9.0 8.9 - 88.8 mg/dL   Total Protein 7.1 6.5 - 8.1 g/dL   Albumin 3.8 3.5 - 5.0 g/dL   AST 27 15 - 41 U/L   ALT 20 0 - 44 U/L   Alkaline Phosphatase 94 38 - 126 U/L   Total Bilirubin 0.8 0.3 - 1.2 mg/dL   GFR, Estimated >28 >00 mL/min    Comment: (NOTE) Calculated using the CKD-EPI Creatinine Equation (2021)    Anion gap 10 5 - 15    Comment:  Performed at [facility], [address] [place], [place] [address]  Magnesium     Status: None   Collection Time: 02/21/20 12:31 AM  Result Value Ref Range   Magnesium 2.3 1.7 - 2.4 mg/dL    Comment: Performed at [facility], [address] [place], [place] [address]  Glucose, capillary     Status: Abnormal   Collection Time: 02/21/20  7:48 AM  Result Value Ref Range   Glucose-Capillary 154 (H) 70 - 99 mg/dL    Comment: Glucose reference range applies only to samples taken after fasting for at least 8 hours.   Comment 1 Notify RN    Comment 2 Document in Chart   Glucose, capillary     Status: Abnormal   Collection Time: 02/21/20 12:07 PM  Result Value Ref Range   Glucose-Capillary 124 (H) 70 - 99 mg/dL    Comment: Glucose reference range applies only to samples taken after fasting for at least 8 hours.  Comment 1 Notify RN    Comment 2 Document in Chart     Current Facility-Administered Medications  Medication Dose Route Frequency Provider Last Rate Last Admin  . 0.9 %  sodium chloride infusion   Intravenous PRN Lanae Boast, MD 10 mL/hr at 02/21/20 0200 Infusion Verify at 02/21/20 0200  . acetaminophen (TYLENOL) tablet 650 mg  650 mg Oral Q6H PRN Marinda Elk, MD   650 mg at 02/18/20 2136   Or  . acetaminophen (TYLENOL) suppository 650 mg  650 mg Rectal Q6H PRN Marinda Elk, MD      . chlorhexidine (PERIDEX) 0.12 % solution 15 mL  15 mL Mouth Rinse BID [place], Eric J, DO   15 mL at 02/20/20 2144  . Chlorhexidine Gluconate Cloth 2 % PADS 6 each  6 each Topical Daily [place], Eric J, DO   6 each at 02/21/20 1228  . diltiazem (CARDIZEM CD) 24 hr capsule 180 mg  180 mg Oral Daily Shalhoub, Deno Lunger, MD   180 mg at 02/20/20 0954  . enoxaparin (LOVENOX) injection 40 mg  40 mg Subcutaneous Q24H Shalhoub, Deno Lunger, MD   40 mg at 02/20/20 1006  . escitalopram (LEXAPRO) tablet 20 mg  20 mg Oral Daily Shalhoub, Deno Lunger, MD   20 mg at  02/20/20 0954  . gabapentin (NEURONTIN) capsule 600 mg  600 mg Oral TID Marinda Elk, MD   600 mg at 02/21/20 1500  . haloperidol lactate (HALDOL) injection 5 mg  5 mg Intravenous Q6H PRN Charm Rings, NP   5 mg at 02/21/20 1500   And  . LORazepam (ATIVAN) injection 1 mg  1 mg Intravenous Q6H PRN Charm Rings, NP   1 mg at 02/21/20 1500  . insulin aspart (novoLOG) injection 0-15 Units  0-15 Units Subcutaneous TID AC & HS Shalhoub, Deno Lunger, MD   2 Units at 02/21/20 1224  . MEDLINE mouth rinse  15 mL Mouth Rinse q12n4p [place], Eric J, DO   15 mL at 02/19/20 1557  . ondansetron (ZOFRAN) tablet 4 mg  4 mg Oral Q6H PRN Shalhoub, Deno Lunger, MD       Or  . ondansetron (ZOFRAN) injection 4 mg  4 mg Intravenous Q6H PRN Shalhoub, Deno Lunger, MD      . polyethylene glycol (MIRALAX / GLYCOLAX) packet 17 g  17 g Oral Daily PRN Shalhoub, Deno Lunger, MD      . thiamine tablet 100 mg  100 mg Oral Daily Lucas, Dayna Barker, MD        Musculoskeletal: Strength & Muscle Tone: decreased Gait & Station: did not witness Patient leans: N/A  Psychiatric Specialty Exam: Physical Exam Vitals and nursing note reviewed.  Constitutional:      Appearance: Normal appearance.  HENT:     Head: Normocephalic.     Nose: Nose normal.  Pulmonary:     Effort: Pulmonary effort is normal.  Musculoskeletal:     Cervical back: Normal range of motion.  Psychiatric:        Mood and Affect: Affect is blunt.        Behavior: Behavior is agitated and combative.        Thought Content: Thought content is delusional.        Cognition and Memory: Cognition is impaired.        Judgment: Judgment is impulsive.     Review of Systems  Psychiatric/Behavioral: Positive for agitation and confusion. The patient is nervous/anxious.  All other systems reviewed and are negative.   Blood pressure (!) 154/95, pulse 69, temperature 97.8 F (36.6 C), temperature source Axillary, resp. rate 16, height 5\' 11"  (1.803 m), weight 90 kg,  SpO2 95 %.Body mass index is 27.67 kg/m.  General Appearance: Casual  Eye Contact:  None, resting quietly, eyes closed  Speech:  Negative  Volume:  negative  Mood:  Anxious at times  Affect:  Blunt  Thought Process:  Unable to assess, UTA sleeping  Orientation:  Other:  sleeping  Thought Content:  UTA  Suicidal Thoughts:  UTA  Homicidal Thoughts:  UTA  Memory:  UTA  Judgement:  Impaired  Insight:  UTA  Psychomotor Activity:  Decreased  Concentration:  UTA  Recall:  UTA  Fund of Knowledge:  UTA  Language:  Negative  Akathisia:  UTA  Handed:  Right  AIMS (if indicated):     Assets:  Leisure Time Physical Health Resilience Social Support  ADL's:  Impaired  Cognition:  Impaired,  Moderate  Sleep:        Treatment Plan Summary: Patient likely has ongoing delirium or substance withdrawals as delirium presents with fluctuating cognition.  At time, patient per RN has consistently been confused, agitated, and difficult to redirect.  The patient's exam is notable for altered sensorium, perceptual disturbances, disorientation and cognitive deficits that appear markedly different than their baseline, suggesting a diagnosis of delirium.  Virtually any medical condition or physiologic stress can precipitate delirium in a susceptible individual, with risk increasing in those with: advanced age, sensory impairments, organic brain disease (stroke, dementia, Parkinsons), psychiatric illness, major chronic medical issues, prolonged hospitalizations, postoperative status, anemia, insomnia/disturbed sleep, and severe pain. Addressing the underlying medical condition and institution of preventative measures are recommended.  - Continue to monitor and treat underlying medical causes of delirium, including infection, electrolyte disturbances, etc. - Delirium precautions - Minimize/avoid deliriogenic meds including: anticholinergic, opiates, benzodiazepines           - Maintain hydration,  oxygenation, nutrition           - Limit use of restraints and catheters           - Normalize sleep patterns by minimizing nighttime noise, light and interruptions by     -Ensure sleep apnea treatment is provided overnight.             clustering care, opening blinds during the day           - Reorient the patient frequently, provide easily visible clock and calendar           - Provide sensory aids like glasses, hearing aids           - Encourage ambulation, regular activities and visitors to maintain cognitive stimulation   -Patient would benefit from having family members at bedside to reinforce his orientation.  Delirium vs Substance Withdrawal: -Discontinue Zyprexa and ATivan 2 mg -Started Haldol 5 mg and Ativan 1 mg BID  Disposition: Supportive therapy provided about ongoing stressors.  Nanine MeansJamison Roshunda Keir, NP 02/21/2020 3:03 PM

## 2020-02-21 NOTE — Progress Notes (Signed)
PROGRESS NOTE  Lucas White HYI:[id number] DOB: 09/23/1960   PCP: [name], MD  Patient is from: Home  DOA: 02/17/2020 LOS: 3  Chief complaints: Altered mental status/confusion  Brief Narrative / Interim history: 60 year old M with history of DM-2, chronic back pain on chronic opiate, COVID-19 infection, HTN, anxiety and depression brought to ED by EMS with confusion and agitation, and admitted to stepdown unit with acute encephalopathy and uncontrolled diabetes with hyperglycemia.  Extensive encephalopathy work-up but unrevealing.  Neurology recommended psych evaluation and pain management.  Psych started Zyprexa.  Patient remains confused and agitated requiring chemical and physical restraints.   Subjective: Seen and examined earlier this morning.  He is in four-point restraints.  Refusing to take his morning medications.  Asking for knife to cut restraints.  Also asking to walk up stairs.  He is only oriented to self but not place.  He refused to answer further questions.  He responds no to pain.  Objective: Vitals:   02/21/20 0500 02/21/20 0600 02/21/20 0800 02/21/20 1000  BP:  (!) 145/64 138/86 (!) 153/91  Pulse:   71   Resp:   18 (!) 21  Temp: 98 F (36.7 C)  97.8 F (36.6 C)   TempSrc: Oral  Axillary   SpO2:   99% 99%  Weight:      Height:        Intake/Output Summary (Last 24 hours) at 02/21/2020 1044 Last data filed at 02/21/2020 0900 Gross per 24 hour  Intake 1184.56 ml  Output 3600 ml  Net -2415.44 ml   Filed Weights   02/17/20 2117  Weight: 90 kg    Examination:  GENERAL: No apparent distress.  Nontoxic. HEENT: MMM.  Vision and hearing grossly intact.  NECK: Supple.  No apparent JVD.  RESP: On RA.  No IWOB.  Fair aeration bilaterally. CVS:  RRR. Heart sounds normal.  ABD/GI/GU: BS+. Abd soft, NTND.  MSK/EXT:  Moves extremities. No apparent deformity. No edema.  Four-point restraints. SKIN: no apparent skin lesion or wound NEURO: Awake and alert.   Only oriented to self but refused to answer further questions.  No apparent focal neuro deficit but limited exam. PSYCH: Confused and agitated.  He is in four-point restraints.  Procedures:  None  Microbiology summarized: COVID-19 PCR nonreactive.  Assessment & Plan: Acute encephalopathy/agitation-he is awake and alert but confused and agitated.  Seems to be recurrent issue.  Previously hospitalized for the same.  He has had extensive work-up but unrevealing.  Some concern about drug (opiate and gabapentin) or alcohol withdrawal but symptoms not consistent with EtOH withdrawal. Dr. Jonathon Bellows discussed with neurology, Dr. Napoleon Form who recommended psych evaluation.  Patient was started on Zyprexa but refusing to take p.o. now.  -Psychiatry following-May need to consider IV antipsychotics as he refuses p.o. -Continue as needed Haldol and Ativan -Okay to continue low-dose oxycodone-appears to take 20 mg daily at home -Continue home gabapentin. -Continue bilateral wrist and ankle restraints   Uncontrolled NIDDM-2 with hyperglycemia: A1c 12.6%.  Likely noncompliant with his medications. Recent Labs  Lab 02/20/20 0746 02/20/20 1151 02/20/20 1625 02/20/20 2124 02/21/20 0748  GLUCAP 173* 159* 117* 200* 154*  -Continue current insulin regimen  Chronic pain syndrome/chronic lower back pain -Continue Tylenol, oxycodone and gabapentin  Anxiety/Depression:  -Continue Lexapro and as needed Ativan.  Hypokalemia:  Resolved.  Essential Hypertension: BP fairly stable,  -continue home Cardizem  Overweight:  Body mass index is 27.67 kg/m.  DVT prophylaxis:  enoxaparin (LOVENOX) injection 40 mg Start: 02/18/20 1000  Code Status: Full code Family Communication: Updated patient's wife over the phone. Level of care: Stepdown Status is: Inpatient  Remains inpatient appropriate because:Altered mental status, Unsafe d/c plan, IV treatments appropriate due to intensity of illness or  inability to take PO and Inpatient level of care appropriate due to severity of illness   Dispo:  Patient From: Home  Planned Disposition: Home  Expected discharge date: 02/21/2020  Medically stable for discharge: No         Consultants:  PCCM Neurology over the phone Psychiatry   Sch Meds:  Scheduled Meds: . chlorhexidine  15 mL Mouth Rinse BID  . Chlorhexidine Gluconate Cloth  6 each Topical Daily  . diltiazem  180 mg Oral Daily  . enoxaparin (LOVENOX) injection  40 mg Subcutaneous Q24H  . escitalopram  20 mg Oral Daily  . gabapentin  600 mg Oral TID  . insulin aspart  0-15 Units Subcutaneous TID AC & HS  . mouth rinse  15 mL Mouth Rinse q12n4p  . OLANZapine zydis  2.5 mg Oral Daily   And  . OLANZapine zydis  5 mg Oral QHS  . thiamine  100 mg Oral Daily   Continuous Infusions: . sodium chloride 10 mL/hr at 02/21/20 0200   PRN Meds:.sodium chloride, acetaminophen **OR** acetaminophen, haloperidol lactate, LORazepam, ondansetron **OR** ondansetron (ZOFRAN) IV, oxyCODONE, polyethylene glycol  Antimicrobials: Anti-infectives (From admission, onward)   None       I have personally reviewed the following labs and images: CBC: Recent Labs  Lab 02/17/20 2200 02/17/20 2208 02/18/20 0420 02/19/20 0241 02/20/20 0228 02/21/20 0031  WBC 10.0  --  9.6 10.4 8.2 8.4  NEUTROABS 6.7  --  6.0  --   --   --   HGB 12.4* 12.9* 12.5* 12.6* 12.9* 12.3*  HCT 36.8* 38.0* 36.8* 38.9* 38.4* 37.3*  MCV 87.0  --  87.2 90.0 88.1 89.7  PLT 410*  --  398 369 416* 416*   BMP &GFR Recent Labs  Lab 02/17/20 2200 02/17/20 2208 02/18/20 0420 02/19/20 0241 02/20/20 0228 02/21/20 0031  NA 136 138 140 141 139 140  K 3.3* 3.4* 3.4* 3.8 3.3* 3.6  CL 101 101 104 109 108 110  CO2 24  --  27 23 20* 20*  GLUCOSE 414* 414* 177* 135* 119* 89  BUN 7 4* 6 8 7 8   CREATININE 0.90 0.70 0.81 0.76 0.84 0.82  CALCIUM 9.1  --  9.0 8.9 8.8* 9.0  MG  --   --  2.1 1.9 1.9 2.3   Estimated  Creatinine Clearance: 105.9 mL/min (by C-G formula based on SCr of 0.82 mg/dL). Liver & Pancreas: Recent Labs  Lab 02/17/20 2200 02/18/20 0420 02/19/20 0241 02/20/20 0228 02/21/20 0031  AST 22 21 25 23 27   ALT 18 18 17 18 20   ALKPHOS 111 104 107 99 94  BILITOT 0.5 0.7 1.0 1.1 0.8  PROT 7.5 7.3 6.9 6.9 7.1  ALBUMIN 4.0 3.9 3.8 3.6 3.8   Recent Labs  Lab 02/17/20 2200  LIPASE 40   Recent Labs  Lab 02/18/20 0420  AMMONIA 22   Diabetic: No results for input(s): HGBA1C in the last 72 hours. Recent Labs  Lab 02/20/20 0746 02/20/20 1151 02/20/20 1625 02/20/20 2124 02/21/20 0748  GLUCAP 173* 159* 117* 200* 154*   Cardiac Enzymes: Recent Labs  Lab 02/18/20 1520  CKTOTAL 300  CKMB 4.7   No results  for input(s): PROBNP in the last 8760 hours. Coagulation Profile: No results for input(s): INR, PROTIME in the last 168 hours. Thyroid Function Tests: No results for input(s): TSH, T4TOTAL, FREET4, T3FREE, THYROIDAB in the last 72 hours. Lipid Profile: No results for input(s): CHOL, HDL, LDLCALC, TRIG, CHOLHDL, LDLDIRECT in the last 72 hours. Anemia Panel: No results for input(s): VITAMINB12, FOLATE, FERRITIN, TIBC, IRON, RETICCTPCT in the last 72 hours. Urine analysis:    Component Value Date/Time   COLORURINE STRAW (A) 02/17/2020 2200   APPEARANCEUR CLEAR 02/17/2020 2200   LABSPEC 1.025 02/17/2020 2200   PHURINE 7.0 02/17/2020 2200   GLUCOSEU >=500 (A) 02/17/2020 2200   HGBUR NEGATIVE 02/17/2020 2200   BILIRUBINUR NEGATIVE 02/17/2020 2200   KETONESUR NEGATIVE 02/17/2020 2200   PROTEINUR NEGATIVE 02/17/2020 2200   NITRITE NEGATIVE 02/17/2020 2200   LEUKOCYTESUR NEGATIVE 02/17/2020 2200   Sepsis Labs: Invalid input(s): PROCALCITONIN, LACTICIDVEN  Microbiology: Recent Results (from the past 240 hour(s))  SARS CORONAVIRUS 2 (TAT 6-24 HRS) Nasopharyngeal Nasopharyngeal Swab     Status: None   Collection Time: 02/18/20  2:25 AM   Specimen: Nasopharyngeal Swab   Result Value Ref Range Status   SARS Coronavirus 2 NEGATIVE NEGATIVE Final    Comment: (NOTE) SARS-CoV-2 target nucleic acids are NOT DETECTED.  The SARS-CoV-2 RNA is generally detectable in upper and lower respiratory specimens during the acute phase of infection. Negative results do not preclude SARS-CoV-2 infection, do not rule out co-infections with other pathogens, and should not be used as the sole basis for treatment or other patient management decisions. Negative results must be combined with clinical observations, patient history, and epidemiological information. The expected result is Negative.  Fact Sheet for Patients: HairSlick.no  Fact Sheet for Healthcare Providers: quierodirigir.com  This test is not yet approved or cleared by the [place] FDA and  has been authorized for detection and/or diagnosis of SARS-CoV-2 by FDA under an Emergency Use Authorization (EUA). This EUA will remain  in effect (meaning this test can be used) for the duration of the COVID-19 declaration under Se ction 564(b)(1) of the Act, 21 U.S.C. section 360bbb-3(b)(1), unless the authorization is terminated or revoked sooner.  Performed at [facility], [address], [place], [place] [address]   MRSA PCR Screening     Status: None   Collection Time: 02/18/20  8:30 PM   Specimen: Nasopharyngeal  Result Value Ref Range Status   MRSA by PCR NEGATIVE NEGATIVE Final    Comment:        The GeneXpert MRSA Assay (FDA approved for NASAL specimens only), is one component of a comprehensive MRSA colonization surveillance program. It is not intended to diagnose MRSA infection nor to guide or monitor treatment for MRSA infections. Performed at [facility], [address] [place], [place] [address]     Radiology Studies: No results found.    Navaya Wiatrek T. Jonaven Hilgers [facility]  If 7PM-7AM, please contact  night-coverage [email or web address] 02/21/2020, 10:44 AM

## 2020-02-22 DIAGNOSIS — R410 Disorientation, unspecified: Secondary | ICD-10-CM | POA: Diagnosis not present

## 2020-02-22 DIAGNOSIS — E1165 Type 2 diabetes mellitus with hyperglycemia: Secondary | ICD-10-CM | POA: Diagnosis not present

## 2020-02-22 DIAGNOSIS — F419 Anxiety disorder, unspecified: Secondary | ICD-10-CM | POA: Diagnosis not present

## 2020-02-22 DIAGNOSIS — G894 Chronic pain syndrome: Secondary | ICD-10-CM | POA: Diagnosis not present

## 2020-02-22 LAB — GLUCOSE, CAPILLARY
Glucose-Capillary: 168 mg/dL — ABNORMAL HIGH (ref 70–99)
Glucose-Capillary: 214 mg/dL — ABNORMAL HIGH (ref 70–99)
Glucose-Capillary: 176 mg/dL — ABNORMAL HIGH (ref 70–99)
Glucose-Capillary: 171 mg/dL — ABNORMAL HIGH (ref 70–99)

## 2020-02-22 MED ORDER — OXYCODONE HCL 5 MG PO TABS: 10.0000 mg | ORAL_TABLET | Freq: Four times a day (QID) | ORAL | Status: DC | PRN

## 2020-02-22 MED ORDER — INSULIN ASPART 100 UNIT/ML ~~LOC~~ SOLN
0.0000 [IU] | Freq: Three times a day (TID) | SUBCUTANEOUS | Status: DC
  Administered 2020-02-22: 3 [IU] via SUBCUTANEOUS
  Administered 2020-02-23: 2 [IU] via SUBCUTANEOUS
  Administered 2020-02-23: 3 [IU] via SUBCUTANEOUS

## 2020-02-22 MED ORDER — LIP MEDEX EX OINT: TOPICAL_OINTMENT | CUTANEOUS | Status: DC | PRN

## 2020-02-22 MED ORDER — INSULIN ASPART 100 UNIT/ML ~~LOC~~ SOLN
4.0000 [IU] | Freq: Three times a day (TID) | SUBCUTANEOUS | Status: DC
  Administered 2020-02-22 – 2020-02-23 (×3): 4 [IU] via SUBCUTANEOUS

## 2020-02-22 MED ORDER — OXYCODONE HCL 5 MG PO TABS
10.0000 mg | ORAL_TABLET | ORAL | Status: DC | PRN
  Administered 2020-02-22 – 2020-02-23 (×6): 10 mg via ORAL
  Filled 2020-02-22 (×6): qty 2

## 2020-02-22 MED ORDER — INSULIN ASPART 100 UNIT/ML ~~LOC~~ SOLN: 0.0000 [IU] | Freq: Every day | SUBCUTANEOUS | Status: DC

## 2020-02-22 NOTE — Progress Notes (Addendum)
PROGRESS NOTE  Lucas White HBZ:[id number] DOB: [birth year]   PCP: Lucas Come, MD  Patient is from: Home  DOA: 02/17/2020 LOS: 4  Chief complaints: Altered mental status/confusion  Brief Narrative / Interim history: 60 year old M with history of DM-2, chronic back pain on chronic opiate, COVID-19 infection, HTN, anxiety and depression brought to ED by EMS with confusion and agitation, and admitted to stepdown unit with acute encephalopathy and uncontrolled diabetes with hyperglycemia.  Extensive encephalopathy work-up but unrevealing.  Neurology recommended psych evaluation and pain management.  Now on as needed Haldol and Ativan per psych.  Also with four-point restraints.   Subjective: Seen and examined earlier this morning. Patient is sleepy but awakes to voice and falls back sleep quickly.  He received an Ativan earlier this morning.  He has been on Haldol since 3 pm yesterday.  He does not appear to be in distress.  Vital signs within normal.  Per RN, calmer and no agitation but confused at times.  Objective: Vitals:   02/22/20 0400 02/22/20 0600 02/22/20 0800 02/22/20 0840  BP: 115/66 120/69 (!) 121/35 125/82  Pulse:   94   Resp: (!) 27 14 15    Temp: 98.9 F (37.2 C)     TempSrc: Axillary     SpO2: 97% 96% 95%   Weight:      Height:        Intake/Output Summary (Last 24 hours) at 02/22/2020 1147 Last data filed at 02/22/2020 0745 Gross per 24 hour  Intake 1172.26 ml  Output 1600 ml  Net -427.74 ml   Filed Weights   02/17/20 2117  Weight: 90 kg    Examination:  GENERAL: No apparent distress.  Nontoxic. HEENT: MMM.  Vision and hearing grossly intact.  NECK: Supple.  No apparent JVD.  RESP: On RA.  No IWOB.  Fair aeration bilaterally. CVS:  RRR. Heart sounds normal.  ABD/GI/GU: BS+. Abd soft, NTND.  MSK/EXT:  Moves extremities. No apparent deformity. No edema.  Bilateral wrist and ankle restraints. SKIN: no apparent skin lesion or wound NEURO: Sleepy but  wakes to voice easily although he falls back asleep quickly.  No apparent focal neuro deficit but limited exam due to mental status PSYCH: Calm.  No distress or agitation.  Procedures:  None  Microbiology summarized: COVID-19 PCR nonreactive.  Assessment & Plan: Acute encephalopathy/agitation-improved.  Seems to be recurrent issue.  Previously hospitalized for the same.  He has had extensive work-up but unrevealing.  Some concern about drug (opiate and gabapentin) or alcohol withdrawal but symptoms not consistent with EtOH withdrawal. Dr. 2118 discussed with neurology, Dr. Jonathon Bellows who recommended psych evaluation.  -Psychiatry following-on as needed Haldol and Ativan. -Okay to continue low-dose oxycodone for lower back pain-appears to take 20 mg daily at home -Continue home gabapentin. -Physical restraints discontinued. -Fall precautions.   Uncontrolled NIDDM-2 with hyperglycemia: A1c 12.6%.  Likely noncompliant with his medications. Recent Labs  Lab 02/21/20 1207 02/21/20 1725 02/21/20 2132 02/22/20 0801 02/22/20 1124  GLUCAP 124* 148* 101* 168* 214*  -Continue SSI-moderate.  -Add nightly coverage and mealtime coverage.  Chronic pain syndrome/chronic lower back pain on chronic opiates -Continue Tylenol, oxycodone (reduced dose) and gabapentin  Anxiety/Depression:  -Continue Lexapro and as needed Ativan.  Hypokalemia:  Resolved.  Essential Hypertension: BP fairly stable,  -continue home Cardizem  Overweight:  Body mass index is 27.67 kg/m.         DVT prophylaxis:  enoxaparin (LOVENOX) injection 40 mg Start: 02/18/20 1000  Code Status:  Full code Family Communication: Updated patient's wife over the phone on 2/19. Level of care: Stepdown.  Transfer to progressive care unit Status is: Inpatient  Remains inpatient appropriate because:Altered mental status, Unsafe d/c plan, IV treatments appropriate due to intensity of illness or inability to take PO and Inpatient  level of care appropriate due to severity of illness   Dispo:  Patient From: Home  Planned Disposition: Home  Expected discharge date: 02/21/2020  Medically stable for discharge: No         Consultants:  PCCM-signed off Neurology over the phone Psychiatry   Sch Meds:  Scheduled Meds: . chlorhexidine  15 mL Mouth Rinse BID  . Chlorhexidine Gluconate Cloth  6 each Topical Daily  . diltiazem  180 mg Oral Daily  . enoxaparin (LOVENOX) injection  40 mg Subcutaneous Q24H  . escitalopram  20 mg Oral Daily  . gabapentin  600 mg Oral TID  . insulin aspart  0-15 Units Subcutaneous TID AC & HS  . mouth rinse  15 mL Mouth Rinse q12n4p  . thiamine  100 mg Oral Daily   Continuous Infusions: . sodium chloride Stopped (02/22/20 0138)   PRN Meds:.sodium chloride, acetaminophen **OR** acetaminophen, haloperidol lactate **AND** LORazepam, lip balm, ondansetron **OR** ondansetron (ZOFRAN) IV, oxyCODONE, polyethylene glycol  Antimicrobials: Anti-infectives (From admission, onward)   None       I have personally reviewed the following labs and images: CBC: Recent Labs  Lab 02/17/20 2200 02/17/20 2208 02/18/20 0420 02/19/20 0241 02/20/20 0228 02/21/20 0031  WBC 10.0  --  9.6 10.4 8.2 8.4  NEUTROABS 6.7  --  6.0  --   --   --   HGB 12.4* 12.9* 12.5* 12.6* 12.9* 12.3*  HCT 36.8* 38.0* 36.8* 38.9* 38.4* 37.3*  MCV 87.0  --  87.2 90.0 88.1 89.7  PLT 410*  --  398 369 416* 416*   BMP &GFR Recent Labs  Lab 02/17/20 2200 02/17/20 2208 02/18/20 0420 02/19/20 0241 02/20/20 0228 02/21/20 0031  NA 136 138 140 141 139 140  K 3.3* 3.4* 3.4* 3.8 3.3* 3.6  CL 101 101 104 109 108 110  CO2 24  --  27 23 20* 20*  GLUCOSE 414* 414* 177* 135* 119* 89  BUN 7 4* 6 8 7 8   CREATININE 0.90 0.70 0.81 0.76 0.84 0.82  CALCIUM 9.1  --  9.0 8.9 8.8* 9.0  MG  --   --  2.1 1.9 1.9 2.3   Estimated Creatinine Clearance: 105.9 mL/min (by C-G formula based on SCr of 0.82 mg/dL). Liver &  Pancreas: Recent Labs  Lab 02/17/20 2200 02/18/20 0420 02/19/20 0241 02/20/20 0228 02/21/20 0031  AST 22 21 25 23 27   ALT 18 18 17 18 20   ALKPHOS 111 104 107 99 94  BILITOT 0.5 0.7 1.0 1.1 0.8  PROT 7.5 7.3 6.9 6.9 7.1  ALBUMIN 4.0 3.9 3.8 3.6 3.8   Recent Labs  Lab 02/17/20 2200  LIPASE 40   Recent Labs  Lab 02/18/20 0420  AMMONIA 22   Diabetic: No results for input(s): HGBA1C in the last 72 hours. Recent Labs  Lab 02/21/20 1207 02/21/20 1725 02/21/20 2132 02/22/20 0801 02/22/20 1124  GLUCAP 124* 148* 101* 168* 214*   Cardiac Enzymes: Recent Labs  Lab 02/18/20 1520  CKTOTAL 300  CKMB 4.7   No results for input(s): PROBNP in the last 8760 hours. Coagulation Profile: No results for input(s): INR, PROTIME in the last 168 hours. Thyroid Function Tests: No  results for input(s): TSH, T4TOTAL, FREET4, T3FREE, THYROIDAB in the last 72 hours. Lipid Profile: No results for input(s): CHOL, HDL, LDLCALC, TRIG, CHOLHDL, LDLDIRECT in the last 72 hours. Anemia Panel: No results for input(s): VITAMINB12, FOLATE, FERRITIN, TIBC, IRON, RETICCTPCT in the last 72 hours. Urine analysis:    Component Value Date/Time   COLORURINE STRAW (A) 02/17/2020 2200   APPEARANCEUR CLEAR 02/17/2020 2200   LABSPEC 1.025 02/17/2020 2200   PHURINE 7.0 02/17/2020 2200   GLUCOSEU >=500 (A) 02/17/2020 2200   HGBUR NEGATIVE 02/17/2020 2200   BILIRUBINUR NEGATIVE 02/17/2020 2200   KETONESUR NEGATIVE 02/17/2020 2200   PROTEINUR NEGATIVE 02/17/2020 2200   NITRITE NEGATIVE 02/17/2020 2200   LEUKOCYTESUR NEGATIVE 02/17/2020 2200   Sepsis Labs: Invalid input(s): PROCALCITONIN, LACTICIDVEN  Microbiology: Recent Results (from the past 240 hour(s))  SARS CORONAVIRUS 2 (TAT 6-24 HRS) Nasopharyngeal Nasopharyngeal Swab     Status: None   Collection Time: 02/18/20  2:25 AM   Specimen: Nasopharyngeal Swab  Result Value Ref Range Status   SARS Coronavirus 2 NEGATIVE NEGATIVE Final    Comment:  (NOTE) SARS-CoV-2 target nucleic acids are NOT DETECTED.  The SARS-CoV-2 RNA is generally detectable in upper and lower respiratory specimens during the acute phase of infection. Negative results do not preclude SARS-CoV-2 infection, do not rule out co-infections with other pathogens, and should not be used as the sole basis for treatment or other patient management decisions. Negative results must be combined with clinical observations, patient history, and epidemiological information. The expected result is Negative.  Fact Sheet for Patients: HairSlick.no  Fact Sheet for Healthcare Providers: quierodirigir.com  This test is not yet approved or cleared by the [place] FDA and  has been authorized for detection and/or diagnosis of SARS-CoV-2 by FDA under an Emergency Use Authorization (EUA). This EUA will remain  in effect (meaning this test can be used) for the duration of the COVID-19 declaration under Se ction 564(b)(1) of the Act, 21 U.S.C. section 360bbb-3(b)(1), unless the authorization is terminated or revoked sooner.  Performed at [facility], [address] [place], [place] [address]   MRSA PCR Screening     Status: None   Collection Time: 02/18/20  8:30 PM   Specimen: Nasopharyngeal  Result Value Ref Range Status   MRSA by PCR NEGATIVE NEGATIVE Final    Comment:        The GeneXpert MRSA Assay (FDA approved for NASAL specimens only), is one component of a comprehensive MRSA colonization surveillance program. It is not intended to diagnose MRSA infection nor to guide or monitor treatment for MRSA infections. Performed at [facility], [address], [place], [place] [address]     Radiology Studies: No results found.    Amaria Mundorf T. Lakin Romer [facility]  If 7PM-7AM, please contact night-coverage [email or web address] 02/22/2020, 11:47 AM

## 2020-02-23 DIAGNOSIS — G8929 Other chronic pain: Secondary | ICD-10-CM

## 2020-02-23 DIAGNOSIS — G894 Chronic pain syndrome: Secondary | ICD-10-CM | POA: Diagnosis not present

## 2020-02-23 DIAGNOSIS — M545 Low back pain, unspecified: Secondary | ICD-10-CM

## 2020-02-23 DIAGNOSIS — R451 Restlessness and agitation: Secondary | ICD-10-CM | POA: Diagnosis not present

## 2020-02-23 DIAGNOSIS — G9341 Metabolic encephalopathy: Secondary | ICD-10-CM | POA: Diagnosis not present

## 2020-02-23 DIAGNOSIS — R410 Disorientation, unspecified: Secondary | ICD-10-CM | POA: Diagnosis not present

## 2020-02-23 LAB — CBC
HCT: 37.3 % — ABNORMAL LOW (ref 39.0–52.0)
Hemoglobin: 12.4 g/dL — ABNORMAL LOW (ref 13.0–17.0)
MCH: 29.7 pg (ref 26.0–34.0)
MCHC: 33.2 g/dL (ref 30.0–36.0)
MCV: 89.4 fL (ref 80.0–100.0)
Platelets: 444 10*3/uL — ABNORMAL HIGH (ref 150–400)
RBC: 4.17 MIL/uL — ABNORMAL LOW (ref 4.22–5.81)
RDW: 12.6 % (ref 11.5–15.5)
WBC: 7.9 10*3/uL (ref 4.0–10.5)
nRBC: 0 % (ref 0.0–0.2)

## 2020-02-23 LAB — RENAL FUNCTION PANEL
Albumin: 3.7 g/dL (ref 3.5–5.0)
Anion gap: 9 (ref 5–15)
BUN: 10 mg/dL (ref 6–20)
CO2: 24 mmol/L (ref 22–32)
Calcium: 8.8 mg/dL — ABNORMAL LOW (ref 8.9–10.3)
Chloride: 105 mmol/L (ref 98–111)
Creatinine, Ser: 0.85 mg/dL (ref 0.61–1.24)
GFR, Estimated: >60 mL/min (ref >60)
Glucose, Bld: 225 mg/dL — ABNORMAL HIGH (ref 70–99)
Phosphorus: 3.7 mg/dL (ref 2.5–4.6)
Potassium: 3.6 mmol/L (ref 3.5–5.1)
Sodium: 138 mmol/L (ref 135–145)

## 2020-02-23 LAB — GLUCOSE, CAPILLARY
Glucose-Capillary: 167 mg/dL — ABNORMAL HIGH (ref 70–99)
Glucose-Capillary: 144 mg/dL — ABNORMAL HIGH (ref 70–99)

## 2020-02-23 LAB — MAGNESIUM: Magnesium: 2 mg/dL (ref 1.7–2.4)

## 2020-02-23 MED ORDER — LORAZEPAM 1 MG PO TABS: 1.0000 mg | ORAL_TABLET | Freq: Two times a day (BID) | ORAL | Status: DC | PRN

## 2020-02-23 MED ORDER — HALOPERIDOL 5 MG PO TABS
5.0000 mg | ORAL_TABLET | Freq: Two times a day (BID) | ORAL | Status: DC | PRN
  Filled 2020-02-23: qty 1

## 2020-02-23 MED ORDER — GABAPENTIN 300 MG PO CAPS: 600.0000 mg | ORAL_CAPSULE | Freq: Three times a day (TID) | ORAL | 0 refills | Status: DC

## 2020-02-23 MED ORDER — OXYCODONE HCL 20 MG PO TABS
20.0000 mg | ORAL_TABLET | Freq: Three times a day (TID) | ORAL | 0 refills | Status: AC | PRN
Start: 2020-02-23 — End: 2020-02-26

## 2020-02-23 NOTE — Care Management Important Message (Signed)
Important Message  Patient Details IM Letter given to the Patient. Name: NEWEL OIEN MRN: [id number] Date of Birth: Jan 01, 1961   Medicare Important Message Given:  Yes     Caren Macadam 02/23/2020, 12:43 PM

## 2020-02-23 NOTE — Evaluation (Signed)
Physical Therapy Evaluation Patient Details Name: Lucas White MRN: [id number] DOB: [birth year] Today's Date: 02/23/2020   History of Present Illness  60 yo male admitted with confusion, disorientation. Hx of DM, chronic back pain, COVID  Clinical Impression  On eval, pt was Min guard-Min assist for mobility. He walked ~400 feet while using hallway  Handrail for support mostly. He is unsteady and at risk for falls. He participated fairly well although he appears generally slow with respect to cognition and mobility. He did c/o some lightheadedness during session. No family present during session. Recommend HHPT, 24 hour supervision/assist, and RW use for ambulation safety. Will continue to follow during hospital stay.     Follow Up Recommendations Home health PT;Supervision/Assistance - 24 hour    Equipment Recommendations  None recommended by PT    Recommendations for Other Services       Precautions / Restrictions Precautions Precautions: Fall Restrictions Weight Bearing Restrictions: No      Mobility  Bed Mobility Overal bed mobility: Modified Independent                  Transfers Overall transfer level: Needs assistance   Transfers: Sit to/from Stand Sit to Stand: Supervision         General transfer comment: close supervision. unsteady.  Ambulation/Gait Ambulation/Gait assistance: Min guard;Min assist Gait Distance (Feet): 400 Feet Assistive device: None (vs hallway handrail) Gait Pattern/deviations: Step-through pattern;Decreased stride length;Wide base of support     General Gait Details: Intermittent assist to steady. Fall risk. Pt c/o some lightheadedness. He tended to close his eyes at times while ambulating  Stairs            Wheelchair Mobility    Modified Rankin (Stroke Patients Only)       Balance Overall balance assessment: Needs assistance           Standing balance-Leahy Scale: Fair                                Pertinent Vitals/Pain Pain Assessment: 0-10 Pain Score: 7  Pain Location: back Pain Descriptors / Indicators: Discomfort;Aching;Sore Pain Intervention(s): Monitored during session;Repositioned    Home Living Family/patient expects to be discharged to:: Private residence Living Arrangements: Spouse/significant other Available Help at Discharge: Family Type of Home: Apartment Home Access: Stairs to enter Entrance Stairs-Rails: Right Entrance Stairs-Number of Steps: 1 flight Home Layout: One level Home Equipment: Environmental consultant - 2 wheels;Cane - single point      Prior Function Level of Independence: Independent with assistive device(s)         Comments: uses cane vs RW PRN     Hand Dominance        Extremity/Trunk Assessment   Upper Extremity Assessment Upper Extremity Assessment: Defer to OT evaluation    Lower Extremity Assessment Lower Extremity Assessment: Generalized weakness    Cervical / Trunk Assessment Cervical / Trunk Assessment: Normal  Communication   Communication: No difficulties  Cognition Arousal/Alertness: Awake/alert Behavior During Therapy: WFL for tasks assessed/performed Overall Cognitive Status: No family/caregiver present to determine baseline cognitive functioning Area of Impairment: Orientation                 Orientation Level: Disoriented to;Time             General Comments: still appears generally slow. still disoreinted to time (i.e how long he has been in hospital)      General Comments  Exercises     Assessment/Plan    PT Assessment Patient needs continued PT services  PT Problem List Decreased mobility;Decreased balance;Decreased cognition;Decreased safety awareness       PT Treatment Interventions DME instruction;Gait training;Therapeutic activities;Therapeutic exercise;Patient/family education;Balance training;Functional mobility training    PT Goals (Current goals can be found in the Care Plan  section)  Acute Rehab PT Goals Patient Stated Goal: none stated PT Goal Formulation: With patient Time For Goal Achievement: 03/08/20 Potential to Achieve Goals: Good    Frequency Min 3X/week   Barriers to discharge        Co-evaluation               AM-PAC PT "6 Clicks" Mobility  Outcome Measure Help needed turning from your back to your side while in a flat bed without using bedrails?: None Help needed moving from lying on your back to sitting on the side of a flat bed without using bedrails?: None Help needed moving to and from a bed to a chair (including a wheelchair)?: A Little Help needed standing up from a chair using your arms (e.g., wheelchair or bedside chair)?: A Little Help needed to walk in hospital room?: A Little Help needed climbing 3-5 steps with a railing? : A Little 6 Click Score: 20    End of Session Equipment Utilized During Treatment: Gait belt Activity Tolerance: Patient tolerated treatment well Patient left: in bed;with call bell/phone within reach;with bed alarm set   PT Visit Diagnosis: Unsteadiness on feet (R26.81)    Time: 0941-1000 PT Time Calculation (min) (ACUTE ONLY): 19 min   Charges:   PT Evaluation $PT Eval Moderate Complexity: 1 Mod            Faye Ramsay, PT Acute Rehabilitation  Office: [phone] Pager: [phone]

## 2020-02-23 NOTE — Discharge Summary (Signed)
Physician Discharge Summary  Lucas White RWE:[id number] DOB: [birth year] DOA: 02/17/2020  PCP: [name], MD  Admit date: 02/17/2020 Discharge date: 02/23/2020  Admitted From: Home Disposition: Home  Recommendations for Outpatient Follow-up:  1. Follow ups as below. 2. Please obtain CBC/BMP/Mag at follow up 3. Outpatient follow-up with psychiatry and pain clinic 4. He may benefit from neurocognitive evaluation when at baseline. 5. Please follow up on the following pending results: None  Home Health: TOC was not able to secure home health.  Outpatient referral for PT Equipment/Devices: Rolling walker  Discharge Condition: Stable CODE STATUS: Full code   Follow-up Information    [name], MD. Schedule an appointment as soon as possible for a visit in 1 week(s).   Specialty: Internal Medicine Contact information: [address] [place] [address]               Hospital Course: 60 year old M with history of DM-2 with neuropathy, chronic back pain on chronic opiate, prior COVID-19 infection, HTN, anxiety and depression brought to ED by EMS with confusion and agitation, and admitted to stepdown unit with acute encephalopathy and uncontrolled diabetes with hyperglycemia.  Extensive encephalopathy work-up but unrevealing.  Neurology recommended psych evaluation and pain management.   Patient initially required pharmacologic and physical restraints due to agitation and confusion.  Eventually, his symptoms improved and remained stable for 48 hours of antipsychotics.  He was cleared for discharge by psychiatry from psych standpoint.  Psychiatry recommended outpatient follow-up.  He was given Rx for oxycodone, #9 until he follows up in pain clinic.  Therapy recommended home health PT and rolling walker.  However, TOC was not able to secure home health.  Outpatient referral ordered.  Discharge Diagnoses:  Acute encephalopathy/agitation-Likely drug withdrawal (opiate  and gabapentin) and possibly delirium. Seems to be recurrent issue.  Previously hospitalized for the same.  He has had extensive work-up but unrevealing.  -Psychiatry recommended outpatient follow-up. -Consider neurocognitive evaluation outpatient. -Continue home gabapentin-renewed his prescription -Gave Rx for oxycodone 20 mg every 8 hours as needed, #9 until he follows up at the pain clinic. -Outpatient PT and rolling walker.   Uncontrolled NIDDM-2 with hyperglycemia: A1c 12.6%.  Likely noncompliant with his medications. Recent Labs  Lab 02/22/20 1124 02/22/20 1556 02/22/20 2101 02/23/20 0755 02/23/20 1133  GLUCAP 214* 176* 171* 167* 144*  -Discharged on home medications.  Chronic pain syndrome/chronic lower back pain on chronic opiates -Continue home gabapentin-renewed his prescription -Gave Rx for oxycodone 20 mg every 8 hours as needed, #9 until he follows up at the pain clinic.  Anxiety/Depression: -Continue Lexapro and as needed Ativan. -Outpatient follow-up with psych  Hypokalemia: Resolved.  Essential Hypertension:BP fairly stable,  -continue home Cardizem  Overweight:  Body mass index is 27.67 kg/m.            Discharge Exam: Vitals:   02/22/20 2105 02/23/20 0454  BP: 100/67 112/73  Pulse: 82 74  Resp: 18   Temp: 98 F (36.7 C) 97.6 F (36.4 C)  SpO2: 98% 100%    GENERAL: No apparent distress.  Nontoxic. HEENT: MMM.  Vision and hearing grossly intact.  NECK: Supple.  No apparent JVD.  RESP: On RA.  No IWOB.  Fair aeration bilaterally. CVS:  RRR. Heart sounds normal.  ABD/GI/GU: Bowel sounds present. Soft. Non tender.  MSK/EXT:  Moves extremities. No apparent deformity. No edema.  SKIN: no apparent skin lesion or wound NEURO: Awake, alert and oriented appropriately.  No apparent focal  neuro deficit. PSYCH: Calm.  No distress or agitation.  Discharge Instructions  Discharge Instructions    Diet - low sodium heart healthy   Complete  by: As directed    Diet Carb Modified   Complete by: As directed    Discharge instructions   Complete by: As directed    It has been a pleasure taking care of you!  You were hospitalized with disorientation, confusion and agitation likely due to withdrawal from pain medications.  Your symptoms improved to the point we think it safe to let you go home and follow-up with your doctors.  Please be cautious with your pain medication.  We also recommend taking your other medications for diabetes and blood pressure.  Please follow-up with your primary care doctor in 1 to 2 weeks   Take care,   Increase activity slowly   Complete by: As directed      Allergies as of 02/23/2020   No Known Allergies     Medication List    STOP taking these medications   ergocalciferol 1.25 MG (50000 UT) capsule Commonly known as: VITAMIN D2     TAKE these medications   cholecalciferol 25 MCG (1000 UNIT) tablet Commonly known as: VITAMIN D3 Take 1,000 Units by mouth daily.   diltiazem 180 MG 24 hr capsule Commonly known as: TIAZAC Take 180 mg by mouth daily.   escitalopram 20 MG tablet Commonly known as: LEXAPRO Take 20 mg by mouth daily.   gabapentin 300 MG capsule Commonly known as: NEURONTIN Take 2 capsules (600 mg total) by mouth 3 (three) times daily. What changed: when to take this   glipiZIDE 10 MG tablet Commonly known as: GLUCOTROL Take 10 mg by mouth daily before breakfast.   glucose 4 GM chewable tablet Chew 16 tablets by mouth as needed for low blood sugar (BS less than 70).   insulin glargine 100 UNIT/ML injection Commonly known as: LANTUS Inject 20 Units into the skin at bedtime.   melatonin 3 MG Tabs tablet Take 6 mg by mouth at bedtime.   metFORMIN 500 MG tablet Commonly known as: GLUCOPHAGE Take 500 mg by mouth 2 (two) times daily with a meal.   Oxycodone HCl 20 MG Tabs Take 1 tablet (20 mg total) by mouth every 8 (eight) hours as needed for up to 3 days. What  changed:   when to take this  reasons to take this   pravastatin 40 MG tablet Commonly known as: PRAVACHOL Take 40 mg by mouth daily.   sildenafil 100 MG tablet Commonly known as: VIAGRA Take 100 mg by mouth daily as needed for erectile dysfunction.            Durable Medical Equipment  (From admission, onward)         Start     Ordered   02/23/20 1126  For home use only DME Walker rolling  Once       Question Answer Comment  Walker: With 5 Inch Wheels   Patient needs a walker to treat with the following condition Unsteady gait      02/23/20 1126          Consultations:  Neurology  Psychiatry  Procedures/Studies:   CT Head Wo Contrast  Result Date: 02/18/2020 CLINICAL DATA:  Altered mental status. EXAM: CT HEAD WITHOUT CONTRAST TECHNIQUE: Contiguous axial images were obtained from the base of the skull through the vertex without intravenous contrast. COMPARISON:  December 13, 2018 FINDINGS: The study is markedly limited  secondary to patient motion. Brain: No evidence of acute infarction, hemorrhage, hydrocephalus, extra-axial collection or mass lesion/mass effect. Vascular: No hyperdense vessel or unexpected calcification. Skull: Normal. Negative for fracture or focal lesion. Sinuses/Orbits: No acute finding. Other: None. IMPRESSION: 1. Markedly limited study secondary to patient motion. 2. No acute intracranial abnormality. Electronically Signed   By: Aram Candela M.D.   On: 02/18/2020 02:01   DG Chest Port 1 View  Result Date: 02/17/2020 CLINICAL DATA:  Altered mental status. EXAM: PORTABLE CHEST 1 VIEW COMPARISON:  Radiograph 12/19/2018 FINDINGS: Mild chronic elevation of right hemidiaphragm.The cardiomediastinal contours are normal. The lungs are clear. Pulmonary vasculature is normal. No consolidation, pleural effusion, or pneumothorax. No acute osseous abnormalities are seen. IMPRESSION: No acute chest findings. Electronically Signed   By: Narda Rutherford M.D.   On: 02/17/2020 23:49   EEG adult  Result Date: 02/18/2020 Charlsie Quest, MD     02/18/2020  3:19 PM Patient Name: ZAHARI XIANG MRN: [id number] Epilepsy Attending: Charlsie Quest Referring Physician/Provider: Zenia Resides, NP Date: 02/18/2020 Duration: 23.16 mins Patient history: 59yo M with ams. EEG to evaluate for seizure Level of alertness: Awake, asleep AEDs during EEG study: GBP, LRZ Technical aspects: This EEG study was done with scalp electrodes positioned according to the 10-20 International system of electrode placement. Electrical activity was acquired at a sampling rate of  and reviewed with a high frequency filter of  and a low frequency filter of . EEG data were recorded continuously and digitally stored. Description: The posterior dominant rhythm consists of 8 Hz activity of moderate voltage (25-35 uV) seen predominantly in posterior head regions, symmetric and reactive to eye opening and eye closing. Sleep was characterized by vertex waves, sleep spindles (12 to 14 Hz), maximal frontocentral region.  EEG showed continuous generalized 5 to 7 Hz theta as well as intermittent 2-3Hz  delta slowing. Hyperventilation and photic stimulation were not performed.   ABNORMALITY -Continuous slow, generalized IMPRESSION: This study is suggestive of mild diffuse encephalopathy, nonspecific etiology. No seizures or epileptiform discharges were seen throughout the recording. Charlsie Quest        The results of significant diagnostics from this hospitalization (including imaging, microbiology, ancillary and laboratory) are listed below for reference.     Microbiology: Recent Results (from the past 240 hour(s))  SARS CORONAVIRUS 2 (TAT 6-24 HRS) Nasopharyngeal Nasopharyngeal Swab     Status: None   Collection Time: 02/18/20  2:25 AM   Specimen: Nasopharyngeal Swab  Result Value Ref Range Status   SARS Coronavirus 2 NEGATIVE NEGATIVE Final    Comment:  (NOTE) SARS-CoV-2 target nucleic acids are NOT DETECTED.  The SARS-CoV-2 RNA is generally detectable in upper and lower respiratory specimens during the acute phase of infection. Negative results do not preclude SARS-CoV-2 infection, do not rule out co-infections with other pathogens, and should not be used as the sole basis for treatment or other patient management decisions. Negative results must be combined with clinical observations, patient history, and epidemiological information. The expected result is Negative.  Fact Sheet for Patients: HairSlick.no  Fact Sheet for Healthcare Providers: quierodirigir.com  This test is not yet approved or cleared by the [place] FDA and  has been authorized for detection and/or diagnosis of SARS-CoV-2 by FDA under an Emergency Use Authorization (EUA). This EUA will remain  in effect (meaning this test can be used) for the duration of the COVID-19 declaration under Se ction 564(b)(1) of the Act, 21 U.S.C. section 360bbb-3(b)(1),  unless the authorization is terminated or revoked sooner.  Performed at [facility], [address] [place], [place] [address]   MRSA PCR Screening     Status: None   Collection Time: 02/18/20  8:30 PM   Specimen: Nasopharyngeal  Result Value Ref Range Status   MRSA by PCR NEGATIVE NEGATIVE Final    Comment:        The GeneXpert MRSA Assay (FDA approved for NASAL specimens only), is one component of a comprehensive MRSA colonization surveillance program. It is not intended to diagnose MRSA infection nor to guide or monitor treatment for MRSA infections. Performed at [facility], [address], [place], [place] [address]      Labs:  CBC: Recent Labs  Lab 02/17/20 2200 02/17/20 2208 02/18/20 0420 02/19/20 0241 02/20/20 0228 02/21/20 0031 02/23/20 0517  WBC 10.0  --  9.6 10.4 8.2 8.4 7.9  NEUTROABS 6.7  --   6.0  --   --   --   --   HGB 12.4*   < > 12.5* 12.6* 12.9* 12.3* 12.4*  HCT 36.8*   < > 36.8* 38.9* 38.4* 37.3* 37.3*  MCV 87.0  --  87.2 90.0 88.1 89.7 89.4  PLT 410*  --  398 369 416* 416* 444*   < > = values in this interval not displayed.   BMP &GFR Recent Labs  Lab 02/18/20 0420 02/19/20 0241 02/20/20 0228 02/21/20 0031 02/23/20 0517  NA 140 141 139 140 138  K 3.4* 3.8 3.3* 3.6 3.6  CL 104 109 108 110 105  CO2 27 23 20* 20* 24  GLUCOSE 177* 135* 119* 89 225*  BUN 6 8 7 8 10   CREATININE 0.81 0.76 0.84 0.82 0.85  CALCIUM 9.0 8.9 8.8* 9.0 8.8*  MG 2.1 1.9 1.9 2.3 2.0  PHOS  --   --   --   --  3.7   Estimated Creatinine Clearance: 102.1 mL/min (by C-G formula based on SCr of 0.85 mg/dL). Liver & Pancreas: Recent Labs  Lab 02/17/20 2200 02/18/20 0420 02/19/20 0241 02/20/20 0228 02/21/20 0031 02/23/20 0517  AST 22 21 25 23 27   --   ALT 18 18 17 18 20   --   ALKPHOS 111 104 107 99 94  --   BILITOT 0.5 0.7 1.0 1.1 0.8  --   PROT 7.5 7.3 6.9 6.9 7.1  --   ALBUMIN 4.0 3.9 3.8 3.6 3.8 3.7   Recent Labs  Lab 02/17/20 2200  LIPASE 40   Recent Labs  Lab 02/18/20 0420  AMMONIA 22   Diabetic: No results for input(s): HGBA1C in the last 72 hours. Recent Labs  Lab 02/22/20 1124 02/22/20 1556 02/22/20 2101 02/23/20 0755 02/23/20 1133  GLUCAP 214* 176* 171* 167* 144*   Cardiac Enzymes: Recent Labs  Lab 02/18/20 1520  CKTOTAL 300  CKMB 4.7   No results for input(s): PROBNP in the last 8760 hours. Coagulation Profile: No results for input(s): INR, PROTIME in the last 168 hours. Thyroid Function Tests: No results for input(s): TSH, T4TOTAL, FREET4, T3FREE, THYROIDAB in the last 72 hours. Lipid Profile: No results for input(s): CHOL, HDL, LDLCALC, TRIG, CHOLHDL, LDLDIRECT in the last 72 hours. Anemia Panel: No results for input(s): VITAMINB12, FOLATE, FERRITIN, TIBC, IRON, RETICCTPCT in the last 72 hours. Urine analysis:    Component Value Date/Time    COLORURINE STRAW (A) 02/17/2020 2200   APPEARANCEUR CLEAR 02/17/2020 2200   LABSPEC 1.025 02/17/2020 2200  PHURINE 7.0 02/17/2020 2200   GLUCOSEU >=500 (A) 02/17/2020 2200   HGBUR NEGATIVE 02/17/2020 2200   BILIRUBINUR NEGATIVE 02/17/2020 2200   KETONESUR NEGATIVE 02/17/2020 2200   PROTEINUR NEGATIVE 02/17/2020 2200   NITRITE NEGATIVE 02/17/2020 2200   LEUKOCYTESUR NEGATIVE 02/17/2020 2200   Sepsis Labs: Invalid input(s): PROCALCITONIN, LACTICIDVEN   Time coordinating discharge: 35 minutes  SIGNED:  Almon Herculesaye T Kent Riendeau, MD  [facility] 02/23/2020, 4:17 PM  If 7PM-7AM, please contact night-coverage [email or web address]

## 2020-02-23 NOTE — Progress Notes (Signed)
Patient fell asleep around 0500, patient displays poor awareness of safety concerns and unaware of limitations, patient is unsteady when standing and needs assistance. Patient is unable to remember conversations and is disoriented to time and situation. Patient has expressed concern about his medications for discharge, numerous times this shift.  Caprice Red, RN

## 2020-02-23 NOTE — Discharge Instructions (Signed)
Confusion Confusion is the inability to think with your usual speed or clarity. Confusion can be caused by many things. People who are confused often describe their thinking as cloudy or unclear. Confusion can also include feeling disoriented. This means you are unaware of where you are or who you are. You may also not know the date or time. When confused, you may have trouble remembering, paying attention, or making decisions. Some people also act aggressively when they are confused. In some cases, confusion may come on quickly. In other cases, it may develop slowly over time. Confusion may be caused by medical conditions such as:  Infections, such as a urinary tract infection (UTI).  Low levels of oxygen, which can develop from conditions such as long-term lung disorders.  Decrease in brain function due to dementia and other conditions that affect the brain, such as seizures, strokes, brain tumors, or head injuries.  Mental health conditions, like panic attacks, anxiety, depression, and hallucinations. Confusion may also be caused by physical factors such as:  Loss of fluid (dehydration) or an imbalance of salts and minerals in the body (electrolytes).  Lack of certain nutrients like niacin, thiamine, or other B vitamins.  Fever or hypothermia, which is a sudden drop in body temperature.  Low or high blood sugar.  Low or high blood pressure. Other causes include:  Lack of sleep or changes in routine or surroundings, such as when traveling or staying in a hospital.  Using too much alcohol, drugs, or medicine.  Side effects of medicines, or taking medicines that affect other medicines (drug interactions). Follow these instructions at home: Pay attention to your symptoms. Tell your health care provider about any changes or if you develop new symptoms. Follow these instructions to control or treat symptoms. Ask a family member or friend for help if needed. Medicines  Take  over-the-counter and prescription medicines only as told by your health care provider.  Ask your health care provider about changing or stopping any medicines that may be causing your confusion.  Avoid pain medicines or sleep medicines until you have fully recovered.  Use a pillbox or an alarm to help you take the right medicines at the right time.   Lifestyle  Eat a balanced diet that includes fruits and vegetables.  Get enough sleep. For most adults, this is 7-9 hours each night.  Do not drink alcohol.  Do not become isolated. Spend time with other people and make plans for your days.  Do not drive until your health care provider says that it is safe to do so.  Do not use any products that contain nicotine or tobacco, such as cigarettes, e-cigarettes, and chewing tobacco. If you need help quitting, ask your health care provider.  Stop other activities that may increase your chances of getting hurt. These may include some work duties, sports activities, swimming, or bike riding. Ask your health care provider what activities are safe for you.   Tips for caregivers  Find out if the person is confused. Ask the person to state his or her name, age, and the date. If the person is unsure or answers incorrectly, he or she may be confused and need assistance.  Always introduce yourself, no matter how well the person knows you. Remind the person of his or her location.  Place a calendar and clock near the person who is confused. Keep a regular schedule. Make sure the person has plenty of light during the day and sleep at night.    Talk about current events and plans for the day.  Keep the environment calm, quiet, and peaceful.  Help the person do the things that he or she is unable to do. These include: ? Taking medicines. ? Keeping medical appointments. ? Helping with household duties, including meal preparation. ? Running errands.  Get help if you need it. There are several support  groups for caregivers. If the person you are helping needs more support, consider day care, extended-care programs, or a skilled nursing facility. The person's health care provider may be able to help evaluate these options. General instructions  Monitor yourself for any conditions you may have. These can include: ? Checking your blood glucose levels if you have diabetes. ? Maintaining a healthy weight. ? Monitoring your blood pressure if you have hypertension. ? Monitoring your body temperature if you have a fever.  Keep all follow-up visits. This is important. Contact a health care provider if:  You have new symptoms or your symptoms get worse. Get help right away if you:  Feel that you are not able to care for yourself.  Develop severe headaches, repeated vomiting, seizures, blackouts, or slurred speech.  Have increasing confusion, weakness, numbness, restlessness, or personality changes.  Develop a loss of balance, have marked dizziness, feel uncoordinated, or fall.  Develop severe anxiety, or you have delusions or hallucinations. These symptoms may represent a serious problem that is an emergency. Do not wait to see if the symptoms will go away. Get medical help right away. Call your local emergency services (911 in the [place] Do not drive yourself to the hospital. Summary  Confusion is the inability to think with your usual speed or clarity. People who are confused often describe their thinking as cloudy or unclear.  Confusion can also include having trouble remembering, paying attention, or making decisions.  Confusion may come on quickly or develop slowly over time, depending on the cause. There are many different causes of confusion.  Ask for help from family members or friends if you are unable to take care of yourself. This information is not intended to replace advice given to you by your health care provider. Make sure you discuss any questions you have with your health  care provider. Document Revised: 04/15/2019 Document Reviewed: 04/15/2019 Elsevier Patient Education  2021 [employer]   Hyperglycemia Hyperglycemia occurs when the level of sugar (glucose) in the blood is too high. Glucose is a type of sugar that provides the body's main source of energy. Certain hormones (insulin and glucagon) control the level of glucose in the blood. Insulin lowers blood glucose, and glucagon increases blood glucose. Hyperglycemia can result from not having enough insulin in the bloodstream, or from the body not responding normally to insulin. Hyperglycemia occurs most often in people who have diabetes (diabetes mellitus), but it can happen in people who do not have diabetes. It can develop quickly, and it can be life-threatening if it causes you to become severely dehydrated (diabetic ketoacidosis or hyperglycemic hyperosmolar state). Severe hyperglycemia is a medical emergency. For most people with diabetes, a blood glucose level above 240 mg/dL is considered hyperglycemia. What are the causes? If you have diabetes, hyperglycemia may be caused by:  Medicines that increase blood glucose or affect your diabetes control.  Getting less physical activity.  Eating more than planned.  Being sick or injured, having an infection, or having surgery.  Stress.  Not giving yourself enough insulin (if you are taking insulin). If you have undiagnosed diabetes, this  may be the reason you have hyperglycemia. If you do not have diabetes, hyperglycemia may be caused by:  Certain medicines, including: ? Steroid medicines. ? Beta-blockers. ? Epinephrine. ? Thiazide diuretics.  Stress.  Having a serious illness, an infection, or surgery.  Diseases of the pancreas. What increases the risk? Hyperglycemia is more likely to develop in people who have risk factors for diabetes, such as:  Having a family member with diabetes.  Certain conditions in which the body's  disease-fighting system (immune system) attacks itself (autoimmune disorders).  Being overweight or obese.  Having an inactive (sedentary) lifestyle.  Having been diagnosed with insulin resistance.  Having a history of prediabetes, gestational diabetes, or polycystic ovarian syndrome (PCOS). What are the signs or symptoms? Hyperglycemia may not cause any symptoms. If you do have symptoms, they may include:  Increased thirst.  Needing to urinate more often than usual.  Hunger.  Feeling very tired.  Blurry vision. Other symptoms may develop if hyperglycemia gets worse, such as:  Dry mouth.  Abdominal pain.  Loss of appetite.  Fruity-smelling breath.  Weakness.  Unexpected weight loss.  Tingling or numbness in the hands or feet.  Headache.  Cuts or bruises that are slow to heal. How is this diagnosed? Hyperglycemia is diagnosed with a blood test to measure your blood glucose level. This blood test is usually done while you are having symptoms. Your health care provider may also do a physical exam and review your medical history. You may have more tests to determine the cause of your hyperglycemia, such as:  A fasting blood glucose (FBG) test. You will not be allowed to eat (you will fast) for at least 8 hours before a blood sample is taken.  An A1C blood test. This provides information about blood glucose control over the previous 2-3 months.  An oral glucose tolerance test (OGTT). This measures your blood glucose at two times: ? After fasting. This is your baseline blood glucose level. ? 2 hours after drinking a beverage that contains glucose. How is this treated? Treatment depends on the cause of your hyperglycemia. Treatment may include:  Taking medicine to regulate your blood glucose levels. If you take insulin or other diabetes medicines, your medicine or dosage may be adjusted.  Lifestyle changes, such as exercising more, eating healthier foods, or losing  weight.  Treating an illness or infection.  Checking your blood glucose more often.  Stopping or reducing steroid medicines. If your hyperglycemia becomes severe and it results in diabetic ketoacidosis or hyperglycemic hyperosmolar state, you must be hospitalized and given IV fluids and IV insulin. Follow these instructions at home: General instructions  Take over-the-counter and prescription medicines only as told by your health care provider.  Do not use any products that contain nicotine or tobacco. These products include cigarettes, chewing tobacco, and vaping devices, such as e-cigarettes. If you need help quitting, ask your health care provider.  If you drink alcohol: ? Limit how much you have to:  0-1 drink a day for women who are not pregnant.  0-2 drinks a day for men. ? Know how much alcohol is in a drink. In the [place] one drink equals one 12 oz bottle of beer (355 mL), one 5 oz glass of wine (148 mL), or one 1 oz glass of hard liquor (44 mL).  Learn to manage stress. If you need help with this, ask your health care provider.  Do exercises as told by your health care provider.  Keep  all follow-up visits. This is important. Eating and drinking  Maintain a healthy weight.  Stay hydrated, especially when you exercise, get sick, or spend time in hot temperatures.  Drink enough fluid to keep your urine pale yellow.   If you have diabetes:  Know the symptoms of hyperglycemia.  Follow your diabetes management plan as told by your health care provider. Make sure you: ? Take your insulin and medicines as told. ? Follow your exercise plan. ? Follow your meal plan. Eat on time, and do not skip meals. ? Check your blood glucose as often as told. Make sure to check your blood glucose before and after exercise. If you exercise longer or in a different way, check your blood glucose more often. ? Follow your sick day plan whenever you cannot eat or drink normally. Make this  plan in advance with your health care provider.  Share your diabetes management plan with people in your workplace, school, and household.  Check your urine for ketones when you are ill and as told by your health care provider.  Carry a medical alert card or wear medical alert jewelry.   Where to find more information American Diabetes Association: www.diabetes.org Contact a health care provider if:  Your blood glucose is at or above 240 mg/dL (10.6 mmol/L) for 2 days in a row.  You have problems keeping your blood glucose in your target range.  You have frequent episodes of hyperglycemia.  You have signs of illness, such as nausea, vomiting, or fever. Get help right away if:  Your blood glucose monitor reads "high" even when you are taking insulin.  You have trouble breathing.  You have a change in how you think, feel, or act (mental status).  You have nausea or vomiting that does not go away. These symptoms may represent a serious problem that is an emergency. Do not wait to see if the symptoms will go away. Get medical help right away. Call your local emergency services (911 in the [place] Do not drive yourself to the hospital. Summary  Hyperglycemia occurs when the level of sugar (glucose) in the blood is too high.  Hyperglycemia can happen with or without diabetes, and severe hyperglycemia can be life-threatening.  Hyperglycemia is diagnosed with a blood test to measure your blood glucose level. This blood test is usually done while you are having symptoms. Your health care provider may also do a physical exam and review your medical history.  If you have diabetes, follow your diabetes management plan as told by your health care provider.  Contact your health care provider if you have problems keeping your blood glucose in your target range. This information is not intended to replace advice given to you by your health care provider. Make sure you discuss any questions you  have with your health care provider. Document Revised: 10/03/2019 Document Reviewed: 10/03/2019 Elsevier Patient Education  2021 [employer].

## 2020-02-23 NOTE — Consult Note (Signed)
[facility] Face-to-Face Psychiatry Consult   Reason for Consult:  Agitation  Referring Physician:  EDP Patient Identification: KODA ROUTON MRN:  [id number] Principal Diagnosis: Confusion and disorientation Diagnosis:  Principal Problem:   Confusion and disorientation Active Problems:   Type 2 diabetes mellitus with hyperglycemia, with long-term current use of insulin (HCC)   Hypokalemia   Chronic pain syndrome   Acute metabolic encephalopathy   Total Time spent with patient: 45 minutes  Subjective:   ERROLL WILBOURNE is a 60 y.o. male patient admitted with AMS and agitation. Psych consult placed for re-evaluation. Patient is seen and assessed by by this nurse practitioner.  Patient is observed to be sitting upright in his bed, talking with his nurse.  He is alert and oriented x4, calm and cooperative, and very pleasant.  Patient is able to hold a linear conversation, denies any recent symptoms of confusion, agitation, and or altered mental status.  He does endorse ongoing periods of confusion, increasing forgetfulness, and rage.  However much of the evaluation is him ruminating on his pain medication.  He continues to perseverate about his treatment at the [place]," Dr. Joseph Art, he is always trying to catch me in something, he does these random drug screens on me, he tells me that I am faking, he makes me get unnecessary tests a few times a year, back, and I am tired of it.  He places a stigma on [place] taken pain medication."  He appears to be responding well to Haldol 5 mg p.o. twice daily, Ativan 1 mg p.o. twice daily.  Patient denies any suicidal ideations, homicidal ideations, and or auditory visual hallucinations.  HPI: 60 year old male with past medical history of diabetes mellitus type 2, chronic low back pain, chronic opiate use who presents to [facility] long hospital emergency department due to family concerns for worsening hyperglycemia over the past several days as well as worsening confusion.   Past  Psychiatric History: substance abuse  Risk to Self:  r/t confusion and agitation Risk to Others:  r/t confusion and agitation Prior Inpatient Therapy:  none Prior Outpatient Therapy:  unknown  Past Medical History:  Past Medical History:  Diagnosis Date  . Chronic back pain greater than 3 months duration   . Obesity (BMI 30.0-34.9)   . Pneumonia due to COVID-19 virus 12/19/2018  . Type 2 diabetes mellitus (HCC)     Past Surgical History:  Procedure Laterality Date  . BACK SURGERY    . COLONOSCOPY     Family History:  Family History  Problem Relation Age of Onset  . Diabetes Father    Family Psychiatric  History: unknown Social History:  Social History   Substance and Sexual Activity  Alcohol Use No     Social History   Substance and Sexual Activity  Drug Use Never    Social History   Socioeconomic History  . Marital status: Married    Spouse name: Not on file  . Number of children: Not on file  . Years of education: Not on file  . Highest education level: Not on file  Occupational History  . Not on file  Tobacco Use  . Smoking status: Never Smoker  . Smokeless tobacco: Never Used  Vaping Use  . Vaping Use: Never used  Substance and Sexual Activity  . Alcohol use: No  . Drug use: Never  . Sexual activity: Not on file  Other Topics Concern  . Not on file  Social History Narrative  . Not on  file   Social Determinants of Health   Financial Resource Strain: Not on file  Food Insecurity: Not on file  Transportation Needs: Not on file  Physical Activity: Not on file  Stress: Not on file  Social Connections: Not on file   Additional Social History:    Allergies:  No Known Allergies  Labs:  Results for orders placed or performed during the hospital encounter of 02/17/20 (from the past 48 hour(s))  Glucose, capillary     Status: Abnormal   Collection Time: 02/21/20 12:07 PM  Result Value Ref Range   Glucose-Capillary 124 (H) 70 - 99 mg/dL     Comment: Glucose reference range applies only to samples taken after fasting for at least 8 hours.   Comment 1 Notify RN    Comment 2 Document in Chart   Glucose, capillary     Status: Abnormal   Collection Time: 02/21/20  5:25 PM  Result Value Ref Range   Glucose-Capillary 148 (H) 70 - 99 mg/dL    Comment: Glucose reference range applies only to samples taken after fasting for at least 8 hours.   Comment 1 Notify RN    Comment 2 Document in Chart   Glucose, capillary     Status: Abnormal   Collection Time: 02/21/20  9:32 PM  Result Value Ref Range   Glucose-Capillary 101 (H) 70 - 99 mg/dL    Comment: Glucose reference range applies only to samples taken after fasting for at least 8 hours.  Glucose, capillary     Status: Abnormal   Collection Time: 02/22/20  8:01 AM  Result Value Ref Range   Glucose-Capillary 168 (H) 70 - 99 mg/dL    Comment: Glucose reference range applies only to samples taken after fasting for at least 8 hours.   Comment 1 Notify RN    Comment 2 Document in Chart   Glucose, capillary     Status: Abnormal   Collection Time: 02/22/20 11:24 AM  Result Value Ref Range   Glucose-Capillary 214 (H) 70 - 99 mg/dL    Comment: Glucose reference range applies only to samples taken after fasting for at least 8 hours.   Comment 1 Notify RN    Comment 2 Document in Chart   Glucose, capillary     Status: Abnormal   Collection Time: 02/22/20  3:56 PM  Result Value Ref Range   Glucose-Capillary 176 (H) 70 - 99 mg/dL    Comment: Glucose reference range applies only to samples taken after fasting for at least 8 hours.  Glucose, capillary     Status: Abnormal   Collection Time: 02/22/20  9:01 PM  Result Value Ref Range   Glucose-Capillary 171 (H) 70 - 99 mg/dL    Comment: Glucose reference range applies only to samples taken after fasting for at least 8 hours.  Renal function panel     Status: Abnormal   Collection Time: 02/23/20  5:17 AM  Result Value Ref Range   Sodium 138  135 - 145 mmol/L   Potassium 3.6 3.5 - 5.1 mmol/L   Chloride 105 98 - 111 mmol/L   CO2 24 22 - 32 mmol/L   Glucose, Bld 225 (H) 70 - 99 mg/dL    Comment: Glucose reference range applies only to samples taken after fasting for at least 8 hours.   BUN 10 6 - 20 mg/dL   Creatinine, Ser 5.95 0.61 - 1.24 mg/dL   Calcium 8.8 (L) 8.9 - 10.3 mg/dL   Phosphorus  3.7 2.5 - 4.6 mg/dL   Albumin 3.7 3.5 - 5.0 g/dL   GFR, Estimated >95>60 >28>60 mL/min    Comment: (NOTE) Calculated using the CKD-EPI Creatinine Equation (2021)    Anion gap 9 5 - 15    Comment: Performed at [facility], [address] [place], [place] [address]  Magnesium     Status: None   Collection Time: 02/23/20  5:17 AM  Result Value Ref Range   Magnesium 2.0 1.7 - 2.4 mg/dL    Comment: Performed at [facility], [address] [place], [place] [address]  CBC     Status: Abnormal   Collection Time: 02/23/20  5:17 AM  Result Value Ref Range   WBC 7.9 4.0 - 10.5 K/uL   RBC 4.17 (L) 4.22 - 5.81 MIL/uL   Hemoglobin 12.4 (L) 13.0 - 17.0 g/dL   HCT 72.537.3 (L) 36.639.0 - 44.052.0 %   MCV 89.4 80.0 - 100.0 fL   MCH 29.7 26.0 - 34.0 pg   MCHC 33.2 30.0 - 36.0 g/dL   RDW 34.712.6 42.511.5 - 95.615.5 %   Platelets 444 (H) 150 - 400 K/uL   nRBC 0.0 0.0 - 0.2 %    Comment: Performed at [facility], [address] [place], [place] [address]  Glucose, capillary     Status: Abnormal   Collection Time: 02/23/20  7:55 AM  Result Value Ref Range   Glucose-Capillary 167 (H) 70 - 99 mg/dL    Comment: Glucose reference range applies only to samples taken after fasting for at least 8 hours.   Comment 1 Notify RN    Comment 2 Document in Chart     Current Facility-Administered Medications  Medication Dose Route Frequency Provider Last Rate Last Admin  . 0.9 %  sodium chloride infusion   Intravenous PRN Lanae BoastKc, Ramesh, MD   Stopped at 02/22/20 0138  . acetaminophen (TYLENOL) tablet 650 mg  650 mg Oral Q6H PRN  Marinda ElkShalhoub, George J, MD   650 mg at 02/22/20 [id number]   Or  . acetaminophen (TYLENOL) suppository 650 mg  650 mg Rectal Q6H PRN Marinda ElkShalhoub, George J, MD      . chlorhexidine (PERIDEX) 0.12 % solution 15 mL  15 mL Mouth Rinse BID [place], Eric J, DO   15 mL at 02/23/20 [id number]  . Chlorhexidine Gluconate Cloth 2 % PADS 6 each  6 each Topical Daily [place], Eric J, DO   6 each at 02/23/20 [phone]  . diltiazem (CARDIZEM CD) 24 hr capsule 180 mg  180 mg Oral Daily Shalhoub, Deno LungerGeorge J, MD   180 mg at 02/23/20 0819  . enoxaparin (LOVENOX) injection 40 mg  40 mg Subcutaneous Q24H Shalhoub, Deno LungerGeorge J, MD   40 mg at 02/23/20 0819  . escitalopram (LEXAPRO) tablet 20 mg  20 mg Oral Daily Shalhoub, Deno LungerGeorge J, MD   20 mg at 02/23/20 0819  . gabapentin (NEURONTIN) capsule 600 mg  600 mg Oral TID Marinda ElkShalhoub, George J, MD   600 mg at 02/23/20 0819  . haloperidol lactate (HALDOL) injection 5 mg  5 mg Intravenous Q6H PRN Charm RingsLord, Jamison Y, NP   5 mg at 02/21/20 1500   And  . LORazepam (ATIVAN) injection 1 mg  1 mg Intravenous Q6H PRN Charm RingsLord, Jamison Y, NP   1 mg at 02/23/20 0130  . insulin aspart (novoLOG) injection 0-15 Units  0-15 Units Subcutaneous TID WC Almon HerculesGonfa, Taye T, MD   3 Units at 02/23/20  1610  . insulin aspart (novoLOG) injection 0-5 Units  0-5 Units Subcutaneous QHS Gonfa, Taye T, MD      . insulin aspart (novoLOG) injection 4 Units  4 Units Subcutaneous TID WC Almon Hercules, MD   4 Units at 02/23/20 0820  . lip balm (CARMEX) ointment   Topical PRN Candelaria Stagers T, MD      . MEDLINE mouth rinse  15 mL Mouth Rinse q12n4p [place], Alvira Philips, DO   15 mL at 02/19/20 1557  . ondansetron (ZOFRAN) tablet 4 mg  4 mg Oral Q6H PRN Shalhoub, Deno Lunger, MD       Or  . ondansetron [facility]) injection 4 mg  4 mg Intravenous Q6H PRN Shalhoub, Deno Lunger, MD      . oxyCODONE (Oxy IR/ROXICODONE) immediate release tablet 10 mg  10 mg Oral Q4H PRN Almon Hercules, MD   10 mg at 02/23/20 0918  . polyethylene glycol (MIRALAX / GLYCOLAX) packet 17 g  17 g  Oral Daily PRN Shalhoub, Deno Lunger, MD      . thiamine tablet 100 mg  100 mg Oral Daily Kc, Ramesh, MD   100 mg at 02/23/20 9604    Musculoskeletal: Strength & Muscle Tone: decreased Gait & Station: did not witness Patient leans: N/A  Psychiatric Specialty Exam: Physical Exam Vitals and nursing note reviewed.  Constitutional:      Appearance: Normal appearance.  HENT:     Head: Normocephalic.     Nose: Nose normal.  Pulmonary:     Effort: Pulmonary effort is normal.  Musculoskeletal:     Cervical back: Normal range of motion.  Psychiatric:        Mood and Affect: Affect is blunt.        Behavior: Behavior is agitated and combative.        Thought Content: Thought content is delusional.        Cognition and Memory: Cognition is impaired.        Judgment: Judgment is impulsive.     Review of Systems  Psychiatric/Behavioral: Positive for agitation and confusion. The patient is nervous/anxious.   All other systems reviewed and are negative.   Blood pressure 112/73, pulse 74, temperature 97.6 F (36.4 C), temperature source Oral, resp. rate 18, height 5\' 11"  (1.803 m), weight 90 kg, SpO2 100 %.Body mass index is 27.67 kg/m.  General Appearance: Casual  Eye Contact:  Fair, resting quietly, eyes closed  Speech:  Clear and Coherent and Normal Rate  Volume:  Normal  Mood:  Anxious at times  Affect:  Appropriate and Congruent  Thought Process:  Unable to assess, UTA sleeping  Orientation:  Full (Time, Place, and Person)  Thought Content:  Logical, Rumination and Tangential  Suicidal Thoughts:  No  Homicidal Thoughts:  No  Memory:  Immediate;   Fair Recent;   Fair  Judgement:  Intact  Insight:  Present  Psychomotor Activity:  Normal  Concentration:  Improved  Recall:  fair  Fund of Knowledge:  WNL  Language:  Negative  Akathisia:  NA  Handed:  Right  AIMS (if indicated):     Assets:  Leisure Time Physical Health Resilience Social Support  ADL's:  Intact  Cognition:   WNL  Sleep:        Treatment Plan Summary: Psychiatric clear.  Patient appears to be responsive to administration of IV haloperidol and Ativan.  Last received Haldol on February 19, will transition to Haldol 5 mg p.o. twice daily  oral as needed, and Ativan 1 mg p.o. twice daily oral as needed.  Patient is to continue his home medication of Lexapro 20 mg p.o. daily, gabapentin 600 mg p.o. 3 times daily, and thiamine 100 mg p.o. daily.  -Delirium, appears to have resolved.  -Opiate use disorder; stable at this time.  However patient does continue to ruminate about his ongoing pain medication and management needs.  He appears to be very focused on his pain medication regimen, and expressing much desire to switch physicians at the [place].  Patient is easy to redirect, however his overall goal is pain medication. -Agitation and aggression; resolved.  Patient has not received any antipsychotics in over 48 hours. Patient will need a referral to neurology for evaluation dementia. -We will place psychiatric referral for ongoing management of agitation, aggression, and intermittent psychosis. patient will need to limit use of Ativan in an outpatient setting secondary to chronic opiate. -Recommend working closely with social worker to facilitate outpatient referral to psych.   Disposition: No evidence of imminent risk to self or others at present.   Patient does not meet criteria for psychiatric inpatient admission. Supportive therapy provided about ongoing stressors.  Maryagnes Amos, FNP 02/23/2020 9:50 AM

## 2020-02-23 NOTE — TOC Transition Note (Addendum)
Transition of Care [facility]) - CM/SW Discharge Note   Patient Details  Name: Lucas White MRN: [id number] Date of Birth: Jun 04, 1960  Transition of Care [facility]) CM/SW Contact:  Lanier Clam, RN Phone Number: 02/23/2020, 12:22 PM   Clinical Narrative:  Checking on HHC agency to acept for HHPT/OT- [facility] unable to accept-awaiting [facility] agency. [employer] for rw.  3:53p-after visit note-unable to get [facility] agency to see patient;provided with otpt PT referral sent in. MD notified.   Final next level of care: Home w Home Health Services Barriers to Discharge: Continued Medical Work up   Patient Goals and CMS Choice Patient states their goals for this hospitalization and ongoing recovery are:: to go home CMS Medicare.gov Compare Post Acute Care list provided to:: Patient    Discharge Placement                       Discharge Plan and Services   Discharge Planning Services: CM Consult            DME Arranged: Dan Humphreys rolling DME Agency: Other - Comment Loyal Buba) Date DME Agency Contacted: 02/23/20 Time DME Agency Contacted: 1221 Representative spoke with at DME Agency: Vaughan Basta HH Arranged: PT          Social Determinants of Health (SDOH) Interventions     Readmission Risk Interventions No flowsheet data found.

## 2020-02-23 NOTE — Plan of Care (Signed)
  Problem: Safety: Goal: Non-violent Restraint(s) Outcome: Progressing   Problem: Education: Goal: Knowledge of General Education information will improve Description: Including pain rating scale, medication(s)/side effects and non-pharmacologic comfort measures Outcome: Progressing   Problem: Health Behavior/Discharge Planning: Goal: Ability to manage health-related needs will improve Outcome: Progressing   Problem: Coping: Goal: Level of anxiety will decrease Outcome: Progressing   Problem: Pain Managment: Goal: General experience of comfort will improve Outcome: Progressing   Problem: Safety: Goal: Ability to remain free from injury will improve Outcome: Progressing

## 2020-03-03 ENCOUNTER — Other Ambulatory Visit: Payer: Self-pay

## 2020-03-03 ENCOUNTER — Emergency Department (HOSPITAL_COMMUNITY): Payer: Medicare Other

## 2020-03-03 ENCOUNTER — Emergency Department (HOSPITAL_COMMUNITY)
Admission: EM | Admit: 2020-03-03 | Discharge: 2020-03-03 | Disposition: A | Discharge status: Home / Self Care | Payer: Medicare Other | Attending: Emergency Medicine | Admitting: Emergency Medicine

## 2020-03-03 ENCOUNTER — Encounter (HOSPITAL_COMMUNITY): Payer: Self-pay

## 2020-03-03 DIAGNOSIS — Z7984 Long term (current) use of oral hypoglycemic drugs: Secondary | ICD-10-CM | POA: Insufficient documentation

## 2020-03-03 DIAGNOSIS — N200 Calculus of kidney: Secondary | ICD-10-CM | POA: Insufficient documentation

## 2020-03-03 DIAGNOSIS — E119 Type 2 diabetes mellitus without complications: Secondary | ICD-10-CM | POA: Diagnosis not present

## 2020-03-03 DIAGNOSIS — Z794 Long term (current) use of insulin: Secondary | ICD-10-CM | POA: Diagnosis not present

## 2020-03-03 DIAGNOSIS — R109 Unspecified abdominal pain: Secondary | ICD-10-CM | POA: Diagnosis present

## 2020-03-03 DIAGNOSIS — Z8616 Personal history of COVID-19: Secondary | ICD-10-CM | POA: Insufficient documentation

## 2020-03-03 LAB — URINALYSIS, ROUTINE W REFLEX MICROSCOPIC
Bacteria, UA: NONE SEEN
Bilirubin Urine: NEGATIVE
Glucose, UA: >=500 mg/dL — AB
Ketones, ur: NEGATIVE mg/dL
Leukocytes,Ua: NEGATIVE
Nitrite: NEGATIVE
Protein, ur: NEGATIVE mg/dL
Specific Gravity, Urine: 1.003 — ABNORMAL LOW (ref 1.005–1.030)
pH: 9 — ABNORMAL HIGH (ref 5.0–8.0)

## 2020-03-03 LAB — COMPREHENSIVE METABOLIC PANEL
ALT: 20 U/L (ref 0–44)
AST: 23 U/L (ref 15–41)
Albumin: 4.5 g/dL (ref 3.5–5.0)
Alkaline Phosphatase: 118 U/L (ref 38–126)
Anion gap: 11 (ref 5–15)
BUN: 14 mg/dL (ref 6–20)
CO2: 24 mmol/L (ref 22–32)
Calcium: 9.8 mg/dL (ref 8.9–10.3)
Chloride: 100 mmol/L (ref 98–111)
Creatinine, Ser: 0.85 mg/dL (ref 0.61–1.24)
GFR, Estimated: >60 mL/min (ref >60)
Glucose, Bld: 252 mg/dL — ABNORMAL HIGH (ref 70–99)
Potassium: 3.6 mmol/L (ref 3.5–5.1)
Sodium: 135 mmol/L (ref 135–145)
Total Bilirubin: 0.7 mg/dL (ref 0.3–1.2)
Total Protein: 8.5 g/dL — ABNORMAL HIGH (ref 6.5–8.1)

## 2020-03-03 LAB — CBC WITH DIFFERENTIAL/PLATELET
Abs Immature Granulocytes: 0.01 10*3/uL (ref 0.00–0.07)
Basophils Absolute: 0.1 10*3/uL (ref 0.0–0.1)
Basophils Relative: 1 %
Eosinophils Absolute: 0.1 10*3/uL (ref 0.0–0.5)
Eosinophils Relative: 2 %
HCT: 39 % (ref 39.0–52.0)
Hemoglobin: 13.1 g/dL (ref 13.0–17.0)
Immature Granulocytes: 0 %
Lymphocytes Relative: 34 %
Lymphs Abs: 2.6 10*3/uL (ref 0.7–4.0)
MCH: 29.3 pg (ref 26.0–34.0)
MCHC: 33.6 g/dL (ref 30.0–36.0)
MCV: 87.2 fL (ref 80.0–100.0)
Monocytes Absolute: 0.3 10*3/uL (ref 0.1–1.0)
Monocytes Relative: 4 %
Neutro Abs: 4.6 10*3/uL (ref 1.7–7.7)
Neutrophils Relative %: 59 %
Platelets: 570 10*3/uL — ABNORMAL HIGH (ref 150–400)
RBC: 4.47 MIL/uL (ref 4.22–5.81)
RDW: 11.9 % (ref 11.5–15.5)
WBC: 7.7 10*3/uL (ref 4.0–10.5)
nRBC: 0 % (ref 0.0–0.2)

## 2020-03-03 LAB — LIPASE, BLOOD: Lipase: 27 U/L (ref 11–51)

## 2020-03-03 MED ORDER — TAMSULOSIN HCL 0.4 MG PO CAPS: 0.4000 mg | ORAL_CAPSULE | Freq: Every day | ORAL | 0 refills | Status: AC

## 2020-03-03 MED ORDER — IBUPROFEN 600 MG PO TABS: 600.0000 mg | ORAL_TABLET | Freq: Four times a day (QID) | ORAL | 0 refills | Status: DC | PRN

## 2020-03-03 MED ORDER — OXYCODONE-ACETAMINOPHEN 5-325 MG PO TABS
1.0000 | ORAL_TABLET | Freq: Once | ORAL | Status: AC
Start: 2020-03-03 — End: 2020-03-03
  Administered 2020-03-03: 1 via ORAL
  Filled 2020-03-03: qty 1

## 2020-03-03 MED ORDER — SODIUM CHLORIDE 0.9 % IV BOLUS
1000.0000 mL | Freq: Once | INTRAVENOUS | Status: AC
  Administered 2020-03-03: 1000 mL via INTRAVENOUS

## 2020-03-03 MED ORDER — MORPHINE SULFATE (PF) 4 MG/ML IV SOLN
4.0000 mg | Freq: Once | INTRAVENOUS | Status: AC
  Administered 2020-03-03: 4 mg via INTRAVENOUS
  Filled 2020-03-03: qty 1

## 2020-03-03 MED ORDER — KETOROLAC TROMETHAMINE 15 MG/ML IJ SOLN
15.0000 mg | Freq: Once | INTRAMUSCULAR | Status: AC
  Administered 2020-03-03: 15 mg via INTRAVENOUS
  Filled 2020-03-03: qty 1

## 2020-03-03 MED ORDER — IOHEXOL 300 MG/ML  SOLN
100.0000 mL | Freq: Once | INTRAMUSCULAR | Status: AC | PRN
  Administered 2020-03-03: 100 mL via INTRAVENOUS

## 2020-03-03 NOTE — ED Triage Notes (Signed)
Patient BIB from home c/o abdominal pain. Pt reports RLQ abdominal pain getting worst even with pain meds. Pt denies n/v/d. Pt a/ox4, ambulatory.

## 2020-03-03 NOTE — ED Provider Notes (Addendum)
[name]-EMERGENCY DEPT Provider Note   CSN: [id number] Arrival date & time: 03/03/20  1955     History Chief Complaint  Patient presents with  . Abdominal Pain    Lucas White is a 60 y.o. male.  Patient presents with 2 to 3 days of right-sided pain describes a sharp and achy worse when he pushes on that side.  No associated fevers or cough or vomiting or diarrhea pain has been persistent for the past couple of days and he presents to the ER for evaluation.        Past Medical History:  Diagnosis Date  . Chronic back pain greater than 3 months duration   . Obesity (BMI 30.0-34.9)   . Pneumonia due to COVID-19 virus 12/19/2018  . Type 2 diabetes mellitus [facility])     Patient Active Problem List   Diagnosis Date Noted  . Hypokalemia 02/18/2020  . Chronic pain syndrome 02/18/2020  . Acute metabolic encephalopathy 02/18/2020  . Volume depletion 12/19/2018  . Confusion and disorientation 12/19/2018  . Type 2 diabetes mellitus with hyperglycemia, with long-term current use of insulin (HCC)   . Chronic back pain greater than 3 months duration   . Obesity (BMI 30.0-34.9)     Past Surgical History:  Procedure Laterality Date  . BACK SURGERY    . COLONOSCOPY         Family History  Problem Relation Age of Onset  . Diabetes Father     Social History   Tobacco Use  . Smoking status: Never Smoker  . Smokeless tobacco: Never Used  Vaping Use  . Vaping Use: Never used  Substance Use Topics  . Alcohol use: No  . Drug use: Never    Home Medications Prior to Admission medications   Medication Sig Start Date End Date Taking? Authorizing Provider  cholecalciferol (VITAMIN D3) 25 MCG (1000 UNIT) tablet Take 1,000 Units by mouth daily.    [provider]  diltiazem (TIAZAC) 180 MG 24 hr capsule Take 180 mg by mouth daily.    [provider]  escitalopram (LEXAPRO) 20 MG tablet Take 20 mg by mouth daily.    [provider]  gabapentin (NEURONTIN) 300 MG capsule Take 2 capsules (600 mg total) by mouth 3 (three) times daily. 02/23/20 03/24/20  Almon Hercules, MD  glipiZIDE (GLUCOTROL) 10 MG tablet Take 10 mg by mouth daily before breakfast.    [provider]  glucose 4 GM chewable tablet Chew 16 tablets by mouth as needed for low blood sugar (BS less than 70).    [provider]  ibuprofen (ADVIL) 600 MG tablet Take 1 tablet (600 mg total) by mouth every 6 (six) hours as needed for up to 21 doses. 03/03/20  Yes Cheryll Cockayne, MD  insulin glargine (LANTUS) 100 UNIT/ML injection Inject 20 Units into the skin at bedtime.    [provider]  melatonin 3 MG TABS tablet Take 6 mg by mouth at bedtime.    [provider]  metFORMIN (GLUCOPHAGE) 500 MG tablet Take 500 mg by mouth 2 (two) times daily with a meal.    [provider]  pravastatin (PRAVACHOL) 40 MG tablet Take 40 mg by mouth daily.    [provider]  sildenafil (VIAGRA) 100 MG tablet Take 100 mg by mouth daily as needed for erectile dysfunction.    [provider]  tamsulosin (FLOMAX) 0.4 MG CAPS capsule Take 1 capsule (0.4 mg total) by  mouth daily after supper for 5 days. 03/03/20 03/08/20 Yes Cheryll Cockayne, MD    Allergies    Patient has no known allergies.  Review of Systems   Review of Systems  Constitutional: Negative for fever.  HENT: Negative for ear pain and sore throat.   Eyes: Negative for pain.  Respiratory: Negative for cough.   Cardiovascular: Negative for chest pain.  Gastrointestinal: Positive for abdominal pain.  Genitourinary: Negative for flank pain.  Musculoskeletal: Negative for back pain.  Skin: Negative for color change and rash.  Neurological: Negative for syncope.  All other systems reviewed and are negative.   Physical Exam Updated Vital Signs BP (!) 131/93   Pulse 80   Temp 98.3 F (36.8 C) (Oral)   Resp (!) 22   SpO2 99%   Physical  Exam Constitutional:      General: He is not in acute distress.    Appearance: He is well-developed.  HENT:     Head: Normocephalic.     Nose: Nose normal.  Eyes:     Extraocular Movements: Extraocular movements intact.  Cardiovascular:     Rate and Rhythm: Normal rate.  Pulmonary:     Effort: Pulmonary effort is normal.  Abdominal:     Tenderness: There is abdominal tenderness.     Comments: Tenderness and guarding to the right mid abdominal region.  Negative Rovsing sign.  Negative tenderness in right lower quadrant.  Skin:    Coloration: Skin is not jaundiced.  Neurological:     Mental Status: He is alert. Mental status is at baseline.     ED Results / Procedures / Treatments   Labs (all labs ordered are listed, but only abnormal results are displayed) Labs Reviewed  CBC WITH DIFFERENTIAL/PLATELET - Abnormal; Notable for the following components:      Result Value   Platelets 570 (*)    All other components within normal limits  COMPREHENSIVE METABOLIC PANEL - Abnormal; Notable for the following components:   Glucose, Bld 252 (*)    Total Protein 8.5 (*)    All other components within normal limits  URINALYSIS, ROUTINE W REFLEX MICROSCOPIC - Abnormal; Notable for the following components:   Color, Urine STRAW (*)    Specific Gravity, Urine 1.003 (*)    pH 9.0 (*)    Glucose, UA >=500 (*)    Hgb urine dipstick SMALL (*)    All other components within normal limits  LIPASE, BLOOD    EKG None  Radiology CT Abdomen Pelvis W Contrast  Result Date: 03/03/2020 CLINICAL DATA:  Right-sided abdominal pain EXAM: CT ABDOMEN AND PELVIS WITH CONTRAST TECHNIQUE: Multidetector CT imaging of the abdomen and pelvis was performed using the standard protocol following bolus administration of intravenous contrast. CONTRAST:  OMNIPAQUE IOHEXOL 300 MG/ML  SOLN COMPARISON:  None. FINDINGS: Lower chest: The lung bases are clear. The heart size is normal. Hepatobiliary: The liver is  normal. Normal gallbladder.There is no biliary ductal dilation. Pancreas: Normal contours without ductal dilatation. No peripancreatic fluid collection. Spleen: Unremarkable. Adrenals/Urinary Tract: --Adrenal glands: Unremarkable. --Right kidney/ureter: There is a nonobstructing 5 mm stone in the proximal right ureter (axial series 2, image 56). There is no significant right-sided hydronephrosis. --Left kidney/ureter: There is a nonobstructing 4 mm stone in the lower pole the left kidney. --Urinary bladder: The bladder is decompressed and therefore is poorly evaluated. Stomach/Bowel: --Stomach/Duodenum: No hiatal hernia or other gastric abnormality. Normal duodenal course and caliber. --Small bowel: Unremarkable. --Colon: Unremarkable. --Appendix:  Normal. Vascular/Lymphatic: Normal course and caliber of the major abdominal vessels. --No retroperitoneal lymphadenopathy. --No mesenteric lymphadenopathy. --No pelvic or inguinal lymphadenopathy. Reproductive: Unremarkable Other: No ascites or free air. The abdominal wall is normal. Musculoskeletal. There is a heterogeneous appearance of the bilateral iliac bones with areas of mixed sclerosis and lucency. This appearance is very similar to prior study in 2010 and is likely benign. There is no acute fracture. IMPRESSION: 1. There is a nonobstructing 5 mm stone in the proximal right ureter. There is no significant right-sided hydronephrosis. 2. Nonobstructive left nephrolithiasis. Electronically Signed   By: Katherine Mantle M.D.   On: 03/03/2020 22:55   US Abdomen Limited RUQ (LIVER/GB)  Result Date: 03/03/2020 CLINICAL DATA:  RIGHT-sided abdominal pain. EXAM: ULTRASOUND ABDOMEN LIMITED RIGHT UPPER QUADRANT COMPARISON:  None. FINDINGS: Gallbladder: No gallstones or wall thickening visualized. No sonographic Murphy sign noted by sonographer. Common bile duct: Diameter: 3 mm Liver: No focal lesion identified. Liver is echogenic indicating fatty infiltration. Portal  vein is patent on color Doppler imaging with normal direction of blood flow towards the liver. Other: None. IMPRESSION: 1. No acute findings.  No evidence of cholecystitis.  No gallstones. 2. Fatty infiltration of the liver. Electronically Signed   By: Bary Richard M.D.   On: 03/03/2020 21:06    Procedures Procedures   Medications Ordered in ED Medications  ketorolac (TORADOL) 15 MG/ML injection 15 mg (has no administration in time range)  morphine 4 MG/ML injection 4 mg (4 mg Intravenous Given 03/03/20 2107)  sodium chloride 0.9 % bolus 1,000 mL (0 mLs Intravenous Stopped 03/03/20 2241)  iohexol (OMNIPAQUE) 300 MG/ML solution 100 mL (100 mLs Intravenous Contrast Given 03/03/20 2226)  oxyCODONE-acetaminophen (PERCOCET/ROXICET) 5-325 MG per tablet 1 tablet (1 tablet Oral Given 03/03/20 2301)    ED Course  I have reviewed the triage vital signs and the nursing notes.  Pertinent labs & imaging results that were available during my care of the patient were reviewed by me and considered in my medical decision making (see chart for details).    MDM Rules/Calculators/A&P                          Patient presents with acute abdominal pain for several days now.  Labs and CT imaging and urinalysis ordered and pending.  Labs unremarkable urinalysis shows no signs of UTI.  Ultrasound shows normal gallbladder.  CT abdomen pelvis shows 5 mm right mid ureteral stone nonobstructing no hydronephrosis noted.  Patient given Toradol, advised follow-up with urology within the week.  Advised immediate return for fevers worsening pain or any additional concerns.  Addendum: patient requesting narcotic prescription.  However, he has multiple narcotics writen for by his PCP, last given oxycodone 54 tablets a few weeks ago.  Will advise to obtain narcotics from PCP.   Final Clinical Impression(s) / ED Diagnoses Final diagnoses:  Right sided abdominal pain  Kidney stone    Rx / DC Orders ED Discharge Orders          Ordered    ibuprofen (ADVIL) 600 MG tablet  Every 6 hours PRN        03/03/20 2303    tamsulosin (FLOMAX) 0.4 MG CAPS capsule  Daily after supper        03/03/20 2303           Cheryll Cockayne, MD 03/03/20 2303    Cheryll Cockayne, MD 03/03/20 2314

## 2020-03-03 NOTE — Discharge Instructions (Signed)
Call your primary care doctor or specialist as discussed in the next 2-3 days.   Return immediately back to the ER if:  Your symptoms worsen within the next 12-24 hours. You develop new symptoms such as new fevers, persistent vomiting, new pain, shortness of breath, or new weakness or numbness, or if you have any other concerns.  

## 2020-03-03 NOTE — ED Notes (Addendum)
Ultrasound at bedside

## 2020-03-10 ENCOUNTER — Telehealth (HOSPITAL_COMMUNITY): Payer: Self-pay

## 2020-03-10 NOTE — Telephone Encounter (Addendum)
Pt called the COVID infusion clinic, stating he was discharged here a couple days ago and wanted to know what RX were called in for him. After looking patient up noted to be seen in the ED. RN informed pt that ibuprofen and flomax were called into the [facility] for him. Pt understands without assistance. RN reviewed discharge summary from ED note with pt.

## 2020-05-27 ENCOUNTER — Emergency Department (HOSPITAL_COMMUNITY): Payer: Medicare Other

## 2020-05-27 ENCOUNTER — Inpatient Hospital Stay (HOSPITAL_COMMUNITY)
Admission: EM | Admit: 2020-05-27 | Discharge: 2020-05-31 | DRG: 882 | Disposition: A | Discharge status: Home / Self Care | Payer: Medicare Other | Attending: Family Medicine | Admitting: Family Medicine

## 2020-05-27 DIAGNOSIS — Z794 Long term (current) use of insulin: Secondary | ICD-10-CM

## 2020-05-27 DIAGNOSIS — F10231 Alcohol dependence with withdrawal delirium: Secondary | ICD-10-CM | POA: Diagnosis present

## 2020-05-27 DIAGNOSIS — Z7984 Long term (current) use of oral hypoglycemic drugs: Secondary | ICD-10-CM

## 2020-05-27 DIAGNOSIS — F99 Mental disorder, not otherwise specified: Secondary | ICD-10-CM

## 2020-05-27 DIAGNOSIS — E1165 Type 2 diabetes mellitus with hyperglycemia: Secondary | ICD-10-CM

## 2020-05-27 DIAGNOSIS — G9341 Metabolic encephalopathy: Secondary | ICD-10-CM | POA: Diagnosis present

## 2020-05-27 DIAGNOSIS — D649 Anemia, unspecified: Secondary | ICD-10-CM | POA: Diagnosis present

## 2020-05-27 DIAGNOSIS — Y900 Blood alcohol level of less than 20 mg/100 ml: Secondary | ICD-10-CM | POA: Diagnosis present

## 2020-05-27 DIAGNOSIS — R4182 Altered mental status, unspecified: Secondary | ICD-10-CM

## 2020-05-27 DIAGNOSIS — Z833 Family history of diabetes mellitus: Secondary | ICD-10-CM

## 2020-05-27 DIAGNOSIS — F4325 Adjustment disorder with mixed disturbance of emotions and conduct: Principal | ICD-10-CM

## 2020-05-27 DIAGNOSIS — Z20822 Contact with and (suspected) exposure to covid-19: Secondary | ICD-10-CM | POA: Diagnosis present

## 2020-05-27 DIAGNOSIS — F32A Depression, unspecified: Secondary | ICD-10-CM | POA: Diagnosis present

## 2020-05-27 DIAGNOSIS — R441 Visual hallucinations: Secondary | ICD-10-CM

## 2020-05-27 DIAGNOSIS — Z8616 Personal history of COVID-19: Secondary | ICD-10-CM

## 2020-05-27 DIAGNOSIS — F22 Delusional disorders: Secondary | ICD-10-CM | POA: Diagnosis present

## 2020-05-27 DIAGNOSIS — Z79899 Other long term (current) drug therapy: Secondary | ICD-10-CM

## 2020-05-27 DIAGNOSIS — F29 Unspecified psychosis not due to a substance or known physiological condition: Secondary | ICD-10-CM

## 2020-05-27 DIAGNOSIS — F1120 Opioid dependence, uncomplicated: Secondary | ICD-10-CM | POA: Diagnosis present

## 2020-05-27 DIAGNOSIS — I10 Essential (primary) hypertension: Secondary | ICD-10-CM | POA: Diagnosis present

## 2020-05-27 DIAGNOSIS — F10221 Alcohol dependence with intoxication delirium: Secondary | ICD-10-CM | POA: Diagnosis present

## 2020-05-27 DIAGNOSIS — E876 Hypokalemia: Secondary | ICD-10-CM | POA: Diagnosis present

## 2020-05-27 DIAGNOSIS — F10239 Alcohol dependence with withdrawal, unspecified: Secondary | ICD-10-CM | POA: Diagnosis present

## 2020-05-27 DIAGNOSIS — E785 Hyperlipidemia, unspecified: Secondary | ICD-10-CM | POA: Diagnosis present

## 2020-05-27 DIAGNOSIS — G894 Chronic pain syndrome: Secondary | ICD-10-CM | POA: Diagnosis present

## 2020-05-27 DIAGNOSIS — R440 Auditory hallucinations: Secondary | ICD-10-CM

## 2020-05-27 LAB — COMPREHENSIVE METABOLIC PANEL
ALT: 19 U/L (ref 0–44)
AST: 27 U/L (ref 15–41)
Albumin: 3.9 g/dL (ref 3.5–5.0)
Alkaline Phosphatase: 115 U/L (ref 38–126)
Anion gap: 10 (ref 5–15)
BUN: 7 mg/dL (ref 6–20)
CO2: 28 mmol/L (ref 22–32)
Calcium: 9.3 mg/dL (ref 8.9–10.3)
Chloride: 99 mmol/L (ref 98–111)
Creatinine, Ser: 1.01 mg/dL (ref 0.61–1.24)
GFR, Estimated: >60 mL/min (ref >60)
Glucose, Bld: 264 mg/dL — ABNORMAL HIGH (ref 70–99)
Potassium: 3.5 mmol/L (ref 3.5–5.1)
Sodium: 137 mmol/L (ref 135–145)
Total Bilirubin: 0.4 mg/dL (ref 0.3–1.2)
Total Protein: 7.8 g/dL (ref 6.5–8.1)

## 2020-05-27 LAB — RAPID URINE DRUG SCREEN, HOSP PERFORMED
Amphetamines: NOT DETECTED
Barbiturates: NOT DETECTED
Benzodiazepines: POSITIVE — AB
Cocaine: NOT DETECTED
Opiates: NOT DETECTED
Tetrahydrocannabinol: NOT DETECTED

## 2020-05-27 LAB — CBC WITH DIFFERENTIAL/PLATELET
Abs Immature Granulocytes: 0.02 10*3/uL (ref 0.00–0.07)
Basophils Absolute: 0 10*3/uL (ref 0.0–0.1)
Basophils Relative: 1 %
Eosinophils Absolute: 0.1 10*3/uL (ref 0.0–0.5)
Eosinophils Relative: 1 %
HCT: 35.4 % — ABNORMAL LOW (ref 39.0–52.0)
Hemoglobin: 11.8 g/dL — ABNORMAL LOW (ref 13.0–17.0)
Immature Granulocytes: 0 %
Lymphocytes Relative: 18 %
Lymphs Abs: 1.5 10*3/uL (ref 0.7–4.0)
MCH: 28.6 pg (ref 26.0–34.0)
MCHC: 33.3 g/dL (ref 30.0–36.0)
MCV: 85.9 fL (ref 80.0–100.0)
Monocytes Absolute: 0.4 10*3/uL (ref 0.1–1.0)
Monocytes Relative: 5 %
Neutro Abs: 6.4 10*3/uL (ref 1.7–7.7)
Neutrophils Relative %: 75 %
Platelets: 574 10*3/uL — ABNORMAL HIGH (ref 150–400)
RBC: 4.12 MIL/uL — ABNORMAL LOW (ref 4.22–5.81)
RDW: 12.1 % (ref 11.5–15.5)
WBC: 8.4 10*3/uL (ref 4.0–10.5)
nRBC: 0 % (ref 0.0–0.2)

## 2020-05-27 LAB — URINALYSIS, ROUTINE W REFLEX MICROSCOPIC
Bacteria, UA: NONE SEEN
Bilirubin Urine: NEGATIVE
Glucose, UA: >=500 mg/dL — AB
Hgb urine dipstick: NEGATIVE
Ketones, ur: NEGATIVE mg/dL
Leukocytes,Ua: NEGATIVE
Nitrite: NEGATIVE
Protein, ur: NEGATIVE mg/dL
Specific Gravity, Urine: 1.01 (ref 1.005–1.030)
pH: 8 (ref 5.0–8.0)

## 2020-05-27 LAB — TSH: TSH: 0.236 u[IU]/mL — ABNORMAL LOW (ref 0.350–4.500)

## 2020-05-27 LAB — RESP PANEL BY RT-PCR (FLU A&B, COVID) ARPGX2
Influenza A by PCR: NEGATIVE
Influenza B by PCR: NEGATIVE
SARS Coronavirus 2 by RT PCR: NEGATIVE

## 2020-05-27 LAB — CBG MONITORING, ED: Glucose-Capillary: 248 mg/dL — ABNORMAL HIGH (ref 70–99)

## 2020-05-27 LAB — SALICYLATE LEVEL: Salicylate Lvl: <7 mg/dL — ABNORMAL LOW (ref 7.0–30.0)

## 2020-05-27 LAB — ETHANOL: Alcohol, Ethyl (B): <10 mg/dL (ref <10)

## 2020-05-27 LAB — ACETAMINOPHEN LEVEL: Acetaminophen (Tylenol), Serum: <10 ug/mL — ABNORMAL LOW (ref 10–30)

## 2020-05-27 LAB — AMMONIA: Ammonia: 21 umol/L (ref 9–35)

## 2020-05-27 LAB — T4, FREE: Free T4: 0.8 ng/dL (ref 0.61–1.12)

## 2020-05-27 MED ORDER — LORAZEPAM 2 MG/ML IJ SOLN
1.0000 mg | Freq: Once | INTRAMUSCULAR | Status: AC
  Administered 2020-05-27: 1 mg via INTRAVENOUS
  Filled 2020-05-27: qty 1

## 2020-05-27 MED ORDER — STERILE WATER FOR INJECTION IJ SOLN
INTRAMUSCULAR | Status: AC
  Administered 2020-05-27: 10 mL
  Filled 2020-05-27: qty 10

## 2020-05-27 MED ORDER — ZIPRASIDONE MESYLATE 20 MG IM SOLR
20.0000 mg | Freq: Once | INTRAMUSCULAR | Status: AC
  Administered 2020-05-27: 20 mg via INTRAMUSCULAR
  Filled 2020-05-27: qty 20

## 2020-05-27 MED ORDER — SODIUM CHLORIDE 0.9 % IV BOLUS
1000.0000 mL | Freq: Once | INTRAVENOUS | Status: AC
  Administered 2020-05-27: 1000 mL via INTRAVENOUS

## 2020-05-27 MED ORDER — LORAZEPAM 2 MG/ML IJ SOLN
2.0000 mg | Freq: Once | INTRAMUSCULAR | Status: AC
  Administered 2020-05-27: 2 mg via INTRAVENOUS
  Filled 2020-05-27: qty 1

## 2020-05-27 MED ORDER — THIAMINE HCL 100 MG/ML IJ SOLN
100.0000 mg | Freq: Once | INTRAMUSCULAR | Status: AC
  Administered 2020-05-27: 100 mg via INTRAVENOUS
  Filled 2020-05-27: qty 2

## 2020-05-27 NOTE — ED Notes (Signed)
2 patient belonging bags placed in locker #30.

## 2020-05-27 NOTE — ED Notes (Signed)
Patient cursing and yelling at staff.

## 2020-05-27 NOTE — ED Provider Notes (Addendum)
Physical Exam  BP 123/84 (BP Location: Right Arm)   Pulse 90   Temp 97.8 F (36.6 C) (Axillary)   Resp 18   SpO2 96%   Physical Exam  ED Course/Procedures   Clinical Course as of 05/27/20 2306  Thu May 27, 2020  1611 CT Head Wo Contrast [CG]  1611 CT Cervical Spine Wo Contrast  IMPRESSION: CT head: 1. No evidence of acute intracranial abnormality on this motionlimited exam. 2. Right frontal scalp contusion without acute fracture. 3. Bilateral remote displaced medial orbital wall fractures.  CT cervical spine: 1. No evidence of acute fracture or traumatic malalignment on this motion limited exam. 2. Nonspecific ground-glass opacities in the visualized right lung apex. Recommend dedicated chest radiograph to further evaluate. 3. 1.6 cm right thyroid nodule. Recommend non urgent thyroid ultrasound follow-up to further characterize. [CG]  1616 Care assumed at shift change 60 yo M with previous admission for delirium presents for confusion, agitation. Found in street saying "Lucas White".  Became acutely agitated and required chemical restrains (geodon).  Wife concerned he has a drug problem, wanted him IVC's.  History of chronic back pain and [facility] trying to wean him off narcotics.  He admitted to ETOH abuse to initial provider but ETOH undetectable.    Work up including labs/imaging reviewed.  Labs essentially unremarkable - mild hyperglycemia, BZD positive but could have been from ED medicines.    He has received IVF, ativan x 4, geodon, thiamine.   Plan is to follow up on CT head/cervical spine, determine disposition.    He had one admission recently with similar presentation, medical work up was unrevealing. He was discharged with psych follow up.  Will place TTS consult.  [CG]  1620 Discharge summary from hospitalization Feb 2022 reviewed:   Acute encephalopathy/agitation-Likely drug withdrawal (opiate and gabapentin) and possibly delirium.Seems to be recurrent  issue. Previously hospitalized for the same. He has had extensive work-up but unrevealing.  -Psychiatry recommended outpatient follow-up. -Consider neurocognitive evaluation outpatient. -Continue home gabapentin-renewed his prescription -Gave Rx for oxycodone 20 mg every 8 hours as needed, #9 until he follows up at the pain clinic. -Outpatient PT and rolling walker." [CG]  1620 Cts reviewed - right frontal scalp contusion, no fracture, non specific ground glass opacities in right lung apex and thyroid nodule.  Will add CXR and TSH/T4 for agitation/delirium [CG]    Clinical Course User Index [CG] Liberty Handy, PA-C    .Critical Care Performed by: Liberty Handy, PA-C Authorized by: Liberty Handy, PA-C   Critical care provider statement:    Critical care time (minutes):  45   Critical care was necessary to treat or prevent imminent or life-threatening deterioration of the following conditions: delirium/geodon restraints.   Critical care was time spent personally by me on the following activities:  Discussions with consultants, evaluation of patient's response to treatment, examination of patient, ordering and performing treatments and interventions, ordering and review of laboratory studies, ordering and review of radiographic studies, pulse oximetry, re-evaluation of patient's condition, obtaining history from patient or surrogate and review of old charts   I assumed direction of critical care for this patient from another provider in my specialty: yes     Care discussed with: admitting provider      MDM   2305: I have evaluated the patient several times.  Seems to somewhat be metabolizing but still very difficult to understand.  He was given geodon at 1200.  He continues to be difficult  to redirect.  Given prolonged AMS will discuss with medicine for possible admission for agitated delirium vs alcohol withdrawal delirium vs psych.  Psych attempted to evaluate patient but  unable to.  There is concern for possible ETOH delirium. His VS are normal.     0030: Spoke to Dr Arville Care who will admit patient     Jerrell Mylar 05/28/20 Marisa Severin, MD 06/01/20 [phone]

## 2020-05-27 NOTE — BH Assessment (Signed)
TTS attempted to see patient , but his nurse was unable to arouse him to participate in the assessment.

## 2020-05-27 NOTE — ED Provider Notes (Signed)
  Face-to-face evaluation   History: presents for evaluation of altered status.  Initially was thought he was intoxicated.  He is unable give any history.  I was called to his room for aggressive behavior, at 12:15 PM.  At this time he is sitting on the bed, cursing consistently, threatening caregivers, demanding that he be left alone.  He is not dysarthric.  He does not appear to have aphasia.  He is responding to internal stimuli, talking to people that are not present.  His MME is 105 mg.  His last prescription was for 7 days, and ran out yesterday, if he was taking it as prescribed.   Physical exam: Alert overweight male, who is agitated, threatening, and danger to self.  He is talking to himself, to people that are not in the room.  MDM-agitated delirium, possibly related to cessation of chronic opioid use.  Urine drug screen is negative for opiates today.  He requires IVC, sedation, protection from harm, and may need additional intervention to control his aggressive behavior.  Medical screening examination/treatment/procedure(s) were conducted as a shared visit with non-physician practitioner(s) and myself.  I personally evaluated the patient during the encounter    Mancel Bale, MD 05/28/20 [phone]

## 2020-05-27 NOTE — ED Notes (Signed)
Pt is yelling loudly, curing staff, and attempting to get out of bed.

## 2020-05-27 NOTE — ED Notes (Signed)
Pt is yelling loudly, cursing staff, and constantly attempting to get out of bed. He is not  redirectable. He yells louder when anyone touches him in an attempted to move his legs off the bed rails and on to the mattress. 1 [employer] Officer and 4 [facility] security guards at bedside.

## 2020-05-27 NOTE — ED Triage Notes (Signed)
Ems brings pt in from home. Neighbors state the patient was outside yelling "Lucas White". Ems found pt laying in the grass. Pt has no complaints.

## 2020-05-27 NOTE — ED Notes (Signed)
Attempted to ambulate pt to restroom. Pt noted to have very unsteady gait. Pt assisted back to bed.

## 2020-05-27 NOTE — ED Notes (Signed)
Restraints were not applied to this pt.

## 2020-05-27 NOTE — ED Provider Notes (Addendum)
[facility]   CSN: [id number] Arrival date & time: 05/27/20  1324     History Chief Complaint  Patient presents with  . Altered Mental Status    Lucas White is a 60 y.o. male brought in by EMS for evaluation of alcohol intoxication.  Neighbors called EMS after patient was found wandering around outside healing "Lucas White."  EMS found patient lying in the grass.  Unclear if he fell.  He does endorse drinking alcohol last night.  States that he drinks beers but will not tell me how many.  Denies any other substance use.  EM LEVEL 5 CAVEAT DUE TO AMS  The history is provided by the patient.       Past Medical History:  Diagnosis Date  . Chronic back pain greater than 3 months duration   . Obesity (BMI 30.0-34.9)   . Pneumonia due to COVID-19 virus 12/19/2018  . Type 2 diabetes mellitus [facility])     Patient Active Problem List   Diagnosis Date Noted  . Hypokalemia 02/18/2020  . Chronic pain syndrome 02/18/2020  . Acute metabolic encephalopathy 02/18/2020  . Volume depletion 12/19/2018  . Confusion and disorientation 12/19/2018  . Type 2 diabetes mellitus with hyperglycemia, with long-term current use of insulin (HCC)   . Chronic back pain greater than 3 months duration   . Obesity (BMI 30.0-34.9)     Past Surgical History:  Procedure Laterality Date  . BACK SURGERY    . COLONOSCOPY         Family History  Problem Relation Age of Onset  . Diabetes Father     Social History   Tobacco Use  . Smoking status: Never Smoker  . Smokeless tobacco: Never Used  Vaping Use  . Vaping Use: Never used  Substance Use Topics  . Alcohol use: No  . Drug use: Never    Home Medications Prior to Admission medications   Medication Sig Start Date End Date Taking? Authorizing Provider  cholecalciferol (VITAMIN D3) 25 MCG (1000 UNIT) tablet Take 1,000 Units by mouth daily.    [provider]  diltiazem (TIAZAC)  180 MG 24 hr capsule Take 180 mg by mouth daily.    [provider]  escitalopram (LEXAPRO) 20 MG tablet Take 20 mg by mouth daily.    [provider]  gabapentin (NEURONTIN) 300 MG capsule Take 2 capsules (600 mg total) by mouth 3 (three) times daily. 02/23/20 03/24/20  Almon Hercules, MD  glipiZIDE (GLUCOTROL) 10 MG tablet Take 10 mg by mouth daily before breakfast.    [provider]  glucose 4 GM chewable tablet Chew 16 tablets by mouth as needed for low blood sugar (BS less than 70).    [provider]  ibuprofen (ADVIL) 600 MG tablet Take 1 tablet (600 mg total) by mouth every 6 (six) hours as needed for up to 21 doses. 03/03/20   Cheryll Cockayne, MD  insulin glargine (LANTUS) 100 UNIT/ML injection Inject 20 Units into the skin at bedtime.    [provider]  melatonin 3 MG TABS tablet Take 6 mg by mouth at bedtime.    [provider]  metFORMIN (GLUCOPHAGE) 500 MG tablet Take 500 mg by mouth 2 (two) times daily with a meal.    [provider]  pravastatin (PRAVACHOL) 40 MG tablet Take 40 mg by mouth daily.    [provider]  sildenafil (VIAGRA) 100 MG tablet Take 100 mg  by mouth daily as needed for erectile dysfunction.    [provider]    Allergies    Patient has no known allergies.  Review of Systems   Review of Systems  Unable to perform ROS: Mental status change    Physical Exam Updated Vital Signs BP 123/84 (BP Location: Right Arm)   Pulse 90   Temp 97.8 F (36.6 C) (Axillary)   Resp 18   SpO2 96%   Physical Exam Vitals and nursing note reviewed.  Constitutional:      Comments: Alert but muttering incoherent   HENT:     Head: Normocephalic and atraumatic.     Comments: Dirt noted to the right side of face. No tenderness to palpation of skull. No deformities or crepitus noted. No open wounds, abrasions or lacerations.  Eyes:     General: Lids are normal.     Conjunctiva/sclera: Conjunctivae  normal.     Pupils: Pupils are equal, round, and reactive to light.  Cardiovascular:     Rate and Rhythm: Normal rate and regular rhythm.     Pulses: Normal pulses.     Heart sounds: Normal heart sounds. No murmur heard. No friction rub. No gallop.   Pulmonary:     Effort: Pulmonary effort is normal.     Breath sounds: Normal breath sounds.     Comments: Lungs clear to auscultation bilaterally.  Symmetric chest rise.  No wheezing, rales, rhonchi. Abdominal:     Palpations: Abdomen is soft. Abdomen is not rigid.     Tenderness: There is no abdominal tenderness. There is no guarding.     Comments: Abdomen is soft, non-distended, non-tender. No rigidity, No guarding. No peritoneal signs.  Musculoskeletal:        General: Normal range of motion.     Cervical back: Full passive range of motion without pain.  Skin:    General: Skin is warm and dry.     Capillary Refill: Capillary refill takes less than 2 seconds.  Neurological:     Mental Status: He is alert.     Comments: Can tell me his name. Otherwise has difficulty answering my questions. Follows some commands.  Moves all extremities.   Psychiatric:        Speech: Speech normal.     ED Results / Procedures / Treatments   Labs (all labs ordered are listed, but only abnormal results are displayed) Labs Reviewed  RAPID URINE DRUG SCREEN, HOSP PERFORMED - Abnormal; Notable for the following components:      Result Value   Benzodiazepines POSITIVE (*)    All other components within normal limits  COMPREHENSIVE METABOLIC PANEL - Abnormal; Notable for the following components:   Glucose, Bld 264 (*)    All other components within normal limits  CBC WITH DIFFERENTIAL/PLATELET - Abnormal; Notable for the following components:   RBC 4.12 (*)    Hemoglobin 11.8 (*)    HCT 35.4 (*)    Platelets 574 (*)    All other components within normal limits  URINALYSIS, ROUTINE W REFLEX MICROSCOPIC - Abnormal; Notable for the following  components:   Color, Urine STRAW (*)    Glucose, UA >=500 (*)    All other components within normal limits  ACETAMINOPHEN LEVEL - Abnormal; Notable for the following components:   Acetaminophen (Tylenol), Serum <10 (*)    All other components within normal limits  SALICYLATE LEVEL - Abnormal; Notable for the following components:   Salicylate Lvl <7.0 (*)  All other components within normal limits  CBG MONITORING, ED - Abnormal; Notable for the following components:   Glucose-Capillary 248 (*)    All other components within normal limits  ETHANOL  AMMONIA    EKG None  Radiology CT Head Wo Contrast  Result Date: 05/27/2020 CLINICAL DATA:  Fall. EXAM: CT HEAD WITHOUT CONTRAST CT CERVICAL SPINE WITHOUT CONTRAST TECHNIQUE: Multidetector CT imaging of the head and cervical spine was performed following the standard protocol without intravenous contrast. Multiplanar CT image reconstructions of the cervical spine were also generated. COMPARISON:  CT head 12/13/2018 FINDINGS: CT HEAD FINDINGS Motion limited exam. Within this limitation: Brain: No evidence of acute infarction, hemorrhage, hydrocephalus, extra-axial collection or mass lesion/mass effect. Vascular: No hyperdense vessel identified. Calcific atherosclerosis. Skull: No acute fracture.  Right frontal scalp contusion. Sinuses/Orbits: Remote displaced bilateral medial orbital wall fractures. Otherwise, unremarkable orbits. Right frontal sinus osteoma. Otherwise, visualized sinuses are clear. Other: No mastoid effusions. CT CERVICAL SPINE FINDINGS Motion limited exam.  Within this limitation: Alignment: No substantial sagittal subluxation. Broad dextrocurvature. Skull base and vertebrae: No evidence of acute fracture. Vertebral body heights appear grossly maintained on the motion limited sagittal sequence. Soft tissues and spinal canal: No obvious prevertebral fluid or swelling. No visible canal hematoma. Disc levels:  Mild multilevel  degenerative change. Upper chest: Ground-glass opacities in the right lung apex. Other: 1.6 cm right thyroid nodule. Nonspecific ground-glass opacities in the visualized right lung apex IMPRESSION: CT head: 1. No evidence of acute intracranial abnormality on this motion limited exam. 2. Right frontal scalp contusion without acute fracture. 3. Bilateral remote displaced medial orbital wall fractures. CT cervical spine: 1. No evidence of acute fracture or traumatic malalignment on this motion limited exam. 2. Nonspecific ground-glass opacities in the visualized right lung apex. Recommend dedicated chest radiograph to further evaluate. 3. 1.6 cm right thyroid nodule. Recommend non urgent thyroid ultrasound follow-up to further characterize. Electronically Signed   By: Feliberto Harts MD   On: 05/27/2020 15:40   CT Cervical Spine Wo Contrast  Result Date: 05/27/2020 CLINICAL DATA:  Fall. EXAM: CT HEAD WITHOUT CONTRAST CT CERVICAL SPINE WITHOUT CONTRAST TECHNIQUE: Multidetector CT imaging of the head and cervical spine was performed following the standard protocol without intravenous contrast. Multiplanar CT image reconstructions of the cervical spine were also generated. COMPARISON:  CT head 12/13/2018 FINDINGS: CT HEAD FINDINGS Motion limited exam. Within this limitation: Brain: No evidence of acute infarction, hemorrhage, hydrocephalus, extra-axial collection or mass lesion/mass effect. Vascular: No hyperdense vessel identified. Calcific atherosclerosis. Skull: No acute fracture.  Right frontal scalp contusion. Sinuses/Orbits: Remote displaced bilateral medial orbital wall fractures. Otherwise, unremarkable orbits. Right frontal sinus osteoma. Otherwise, visualized sinuses are clear. Other: No mastoid effusions. CT CERVICAL SPINE FINDINGS Motion limited exam.  Within this limitation: Alignment: No substantial sagittal subluxation. Broad dextrocurvature. Skull base and vertebrae: No evidence of acute fracture.  Vertebral body heights appear grossly maintained on the motion limited sagittal sequence. Soft tissues and spinal canal: No obvious prevertebral fluid or swelling. No visible canal hematoma. Disc levels:  Mild multilevel degenerative change. Upper chest: Ground-glass opacities in the right lung apex. Other: 1.6 cm right thyroid nodule. Nonspecific ground-glass opacities in the visualized right lung apex IMPRESSION: CT head: 1. No evidence of acute intracranial abnormality on this motion limited exam. 2. Right frontal scalp contusion without acute fracture. 3. Bilateral remote displaced medial orbital wall fractures. CT cervical spine: 1. No evidence of acute fracture or traumatic malalignment on this motion limited  exam. 2. Nonspecific ground-glass opacities in the visualized right lung apex. Recommend dedicated chest radiograph to further evaluate. 3. 1.6 cm right thyroid nodule. Recommend non urgent thyroid ultrasound follow-up to further characterize. Electronically Signed   By: Feliberto Harts MD   On: 05/27/2020 15:40    Procedures Procedures   Medications Ordered in ED Medications  sodium chloride 0.9 % bolus 1,000 mL (0 mLs Intravenous Stopped 05/27/20 1120)  thiamine (B-1) injection 100 mg (100 mg Intravenous Given 05/27/20 1033)  LORazepam (ATIVAN) injection 1 mg (1 mg Intravenous Given 05/27/20 1055)  LORazepam (ATIVAN) injection 1 mg (1 mg Intravenous Given 05/27/20 1109)  LORazepam (ATIVAN) injection 2 mg (2 mg Intravenous Given 05/27/20 1227)  ziprasidone (GEODON) injection 20 mg (20 mg Intramuscular Given 05/27/20 1227)  sterile water (preservative free) injection (10 mLs  Given 05/27/20 1228)  LORazepam (ATIVAN) injection 1 mg (1 mg Intravenous Given 05/27/20 1445)    ED Course  I have reviewed the triage vital signs and the nursing notes.  Pertinent labs & imaging results that were available during my care of the patient were reviewed by me and considered in my medical decision making  (see chart for details).    MDM Rules/Calculators/A&P                          60 year old male brought in by EMS for evaluation of concerns of altered mental status.  Patient was initially found by neighbors where he was outside screaming "Lucas White."  Patient apparently told EMS that he had been drinking beer.  Patient was altered for EMS.  On initial arrival, he is afebrile, vitals are stable.  On my exam, he does appear altered.  He follows some commands but otherwise is monitoring.  Unable to obtain an adequate history from him.  We will plan for labs, CT head given that there was some concern for fall.  Wife came to the ED.  I was called to patient's bed.  Wife was talking to patient, patient became agitated and started yelling, becoming combative.  Patient was given Ativan.  I talked with wife.  She states that patient has history of narcotic abuse.  He is followed by [facility] doctor and states that he was told that he would need to be cut back.  She states that he has been unwilling to go to rehab to cut back.  She does not think that he has been drinking as he normally does not drink alcohol but does agree that he is more combative, agitated than normal  CBC shows hemoglobin 11.8.  No leukocytosis.  Platelets 574.  CMP shows glucose of 264.  BUN and creatinine within normal limits.  Anion gap is normal.  Ammonia level is negative.  UDS is positive for benzos.  Salicylate, acetaminophen level unremarkable.  Patient was becoming more combative.  ED attending evaluated patient and was given Geodon.  Unclear at this time if the etiology of his symptoms are withdrawal versus psych.  Patient was initially unable to obtain head CT given movement.  Patient is sleeping from Geodon.  Will obtain CT scans.  Patient signed out to Sharen Heck, PA-C pending imaging and re-evaluation.   Final Clinical Impression(s) / ED Diagnoses Final diagnoses:  Altered mental status, unspecified altered mental  status type    Rx / DC Orders ED Discharge Orders    None       Maxwell Caul, PA-C 05/27/20 1549  Maxwell CaulLayden, Loeta Herst A, PA-C 05/27/20 1549    Mancel BaleWentz, Elliott, MD 05/28/20 [phone]
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
## 2020-05-28 ENCOUNTER — Encounter (HOSPITAL_COMMUNITY): Payer: Self-pay | Admitting: Family Medicine

## 2020-05-28 DIAGNOSIS — Z7984 Long term (current) use of oral hypoglycemic drugs: Secondary | ICD-10-CM | POA: Diagnosis not present

## 2020-05-28 DIAGNOSIS — G9341 Metabolic encephalopathy: Secondary | ICD-10-CM

## 2020-05-28 DIAGNOSIS — E1165 Type 2 diabetes mellitus with hyperglycemia: Secondary | ICD-10-CM | POA: Diagnosis present

## 2020-05-28 DIAGNOSIS — Z833 Family history of diabetes mellitus: Secondary | ICD-10-CM | POA: Diagnosis not present

## 2020-05-28 DIAGNOSIS — F10931 Alcohol use, unspecified with withdrawal delirium: Secondary | ICD-10-CM | POA: Insufficient documentation

## 2020-05-28 DIAGNOSIS — D649 Anemia, unspecified: Secondary | ICD-10-CM | POA: Diagnosis present

## 2020-05-28 DIAGNOSIS — Z79899 Other long term (current) drug therapy: Secondary | ICD-10-CM | POA: Diagnosis not present

## 2020-05-28 DIAGNOSIS — F10951 Alcohol use, unspecified with alcohol-induced psychotic disorder with hallucinations: Secondary | ICD-10-CM | POA: Diagnosis not present

## 2020-05-28 DIAGNOSIS — F10939 Alcohol use, unspecified with withdrawal, unspecified: Secondary | ICD-10-CM | POA: Insufficient documentation

## 2020-05-28 DIAGNOSIS — E785 Hyperlipidemia, unspecified: Secondary | ICD-10-CM | POA: Diagnosis present

## 2020-05-28 DIAGNOSIS — F1120 Opioid dependence, uncomplicated: Secondary | ICD-10-CM | POA: Diagnosis present

## 2020-05-28 DIAGNOSIS — F10239 Alcohol dependence with withdrawal, unspecified: Secondary | ICD-10-CM | POA: Insufficient documentation

## 2020-05-28 DIAGNOSIS — R4182 Altered mental status, unspecified: Secondary | ICD-10-CM | POA: Diagnosis not present

## 2020-05-28 DIAGNOSIS — Y900 Blood alcohol level of less than 20 mg/100 ml: Secondary | ICD-10-CM | POA: Diagnosis present

## 2020-05-28 DIAGNOSIS — F10221 Alcohol dependence with intoxication delirium: Secondary | ICD-10-CM | POA: Diagnosis present

## 2020-05-28 DIAGNOSIS — F10231 Alcohol dependence with withdrawal delirium: Secondary | ICD-10-CM | POA: Diagnosis present

## 2020-05-28 DIAGNOSIS — F32A Depression, unspecified: Secondary | ICD-10-CM | POA: Diagnosis present

## 2020-05-28 DIAGNOSIS — Z20822 Contact with and (suspected) exposure to covid-19: Secondary | ICD-10-CM | POA: Diagnosis present

## 2020-05-28 DIAGNOSIS — F22 Delusional disorders: Secondary | ICD-10-CM | POA: Diagnosis present

## 2020-05-28 DIAGNOSIS — F29 Unspecified psychosis not due to a substance or known physiological condition: Secondary | ICD-10-CM | POA: Diagnosis not present

## 2020-05-28 DIAGNOSIS — G894 Chronic pain syndrome: Secondary | ICD-10-CM | POA: Diagnosis present

## 2020-05-28 DIAGNOSIS — Z8616 Personal history of COVID-19: Secondary | ICD-10-CM | POA: Diagnosis not present

## 2020-05-28 DIAGNOSIS — I10 Essential (primary) hypertension: Secondary | ICD-10-CM | POA: Diagnosis present

## 2020-05-28 DIAGNOSIS — Z794 Long term (current) use of insulin: Secondary | ICD-10-CM | POA: Diagnosis not present

## 2020-05-28 DIAGNOSIS — F4325 Adjustment disorder with mixed disturbance of emotions and conduct: Secondary | ICD-10-CM | POA: Diagnosis present

## 2020-05-28 DIAGNOSIS — E876 Hypokalemia: Secondary | ICD-10-CM | POA: Diagnosis present

## 2020-05-28 LAB — CBC
HCT: 39.4 % (ref 39.0–52.0)
Hemoglobin: 13.3 g/dL (ref 13.0–17.0)
MCH: 28.9 pg (ref 26.0–34.0)
MCHC: 33.8 g/dL (ref 30.0–36.0)
MCV: 85.7 fL (ref 80.0–100.0)
Platelets: 611 10*3/uL — ABNORMAL HIGH (ref 150–400)
RBC: 4.6 MIL/uL (ref 4.22–5.81)
RDW: 12.3 % (ref 11.5–15.5)
WBC: 12.3 10*3/uL — ABNORMAL HIGH (ref 4.0–10.5)
nRBC: 0 % (ref 0.0–0.2)

## 2020-05-28 LAB — COMPREHENSIVE METABOLIC PANEL
ALT: 20 U/L (ref 0–44)
AST: 34 U/L (ref 15–41)
Albumin: 4 g/dL (ref 3.5–5.0)
Alkaline Phosphatase: 129 U/L — ABNORMAL HIGH (ref 38–126)
Anion gap: 9 (ref 5–15)
BUN: 7 mg/dL (ref 6–20)
CO2: 25 mmol/L (ref 22–32)
Calcium: 9.5 mg/dL (ref 8.9–10.3)
Chloride: 107 mmol/L (ref 98–111)
Creatinine, Ser: 0.87 mg/dL (ref 0.61–1.24)
GFR, Estimated: >60 mL/min (ref >60)
Glucose, Bld: 165 mg/dL — ABNORMAL HIGH (ref 70–99)
Potassium: 3.7 mmol/L (ref 3.5–5.1)
Sodium: 141 mmol/L (ref 135–145)
Total Bilirubin: 0.6 mg/dL (ref 0.3–1.2)
Total Protein: 8.2 g/dL — ABNORMAL HIGH (ref 6.5–8.1)

## 2020-05-28 LAB — CBG MONITORING, ED
Glucose-Capillary: 156 mg/dL — ABNORMAL HIGH (ref 70–99)
Glucose-Capillary: 165 mg/dL — ABNORMAL HIGH (ref 70–99)
Glucose-Capillary: 174 mg/dL — ABNORMAL HIGH (ref 70–99)

## 2020-05-28 LAB — GLUCOSE, CAPILLARY
Glucose-Capillary: 177 mg/dL — ABNORMAL HIGH (ref 70–99)
Glucose-Capillary: 196 mg/dL — ABNORMAL HIGH (ref 70–99)

## 2020-05-28 LAB — MRSA PCR SCREENING: MRSA by PCR: NEGATIVE

## 2020-05-28 MED ORDER — ENOXAPARIN SODIUM 40 MG/0.4ML IJ SOSY
40.0000 mg | PREFILLED_SYRINGE | INTRAMUSCULAR | Status: DC
  Administered 2020-05-28 – 2020-05-31 (×4): 40 mg via SUBCUTANEOUS
  Filled 2020-05-28 (×4): qty 0.4

## 2020-05-28 MED ORDER — ACETAMINOPHEN 325 MG PO TABS: 650.0000 mg | ORAL_TABLET | Freq: Four times a day (QID) | ORAL | Status: DC | PRN

## 2020-05-28 MED ORDER — VITAMIN D 25 MCG (1000 UNIT) PO TABS
1000.0000 [IU] | ORAL_TABLET | Freq: Every day | ORAL | Status: DC
  Administered 2020-05-29 – 2020-05-31 (×3): 1000 [IU] via ORAL
  Filled 2020-05-28 (×3): qty 1

## 2020-05-28 MED ORDER — MAGNESIUM HYDROXIDE 400 MG/5ML PO SUSP
30.0000 mL | Freq: Every day | ORAL | Status: DC | PRN
  Filled 2020-05-28: qty 30

## 2020-05-28 MED ORDER — DILTIAZEM HCL ER COATED BEADS 180 MG PO CP24
180.0000 mg | ORAL_CAPSULE | Freq: Every day | ORAL | Status: DC
  Administered 2020-05-29 – 2020-05-31 (×3): 180 mg via ORAL
  Filled 2020-05-28 (×3): qty 1

## 2020-05-28 MED ORDER — CHLORHEXIDINE GLUCONATE CLOTH 2 % EX PADS
6.0000 | MEDICATED_PAD | Freq: Every day | CUTANEOUS | Status: DC
  Administered 2020-05-28 – 2020-05-31 (×4): 6 via TOPICAL

## 2020-05-28 MED ORDER — INSULIN ASPART 100 UNIT/ML IJ SOLN
0.0000 [IU] | Freq: Three times a day (TID) | INTRAMUSCULAR | Status: DC
  Administered 2020-05-28 – 2020-05-29 (×6): 3 [IU] via SUBCUTANEOUS
  Administered 2020-05-29: 8 [IU] via SUBCUTANEOUS
  Administered 2020-05-29: 11 [IU] via SUBCUTANEOUS
  Administered 2020-05-30: 15 [IU] via SUBCUTANEOUS
  Administered 2020-05-30 – 2020-05-31 (×3): 5 [IU] via SUBCUTANEOUS
  Administered 2020-05-31: 3 [IU] via SUBCUTANEOUS
  Filled 2020-05-28: qty 0.15

## 2020-05-28 MED ORDER — GLIPIZIDE 10 MG PO TABS: 10.0000 mg | ORAL_TABLET | Freq: Every day | ORAL | Status: DC

## 2020-05-28 MED ORDER — DILTIAZEM HCL ER BEADS 180 MG PO CP24: 180.0000 mg | ORAL_CAPSULE | Freq: Every day | ORAL | Status: DC

## 2020-05-28 MED ORDER — ESCITALOPRAM OXALATE 20 MG PO TABS
20.0000 mg | ORAL_TABLET | Freq: Every day | ORAL | Status: DC
  Administered 2020-05-29 – 2020-05-31 (×3): 20 mg via ORAL
  Filled 2020-05-28 (×3): qty 1

## 2020-05-28 MED ORDER — THIAMINE HCL 100 MG/ML IJ SOLN
Freq: Once | INTRAVENOUS | Status: AC
  Filled 2020-05-28: qty 1000

## 2020-05-28 MED ORDER — ONDANSETRON HCL 4 MG/2ML IJ SOLN: 4.0000 mg | Freq: Four times a day (QID) | INTRAMUSCULAR | Status: DC | PRN

## 2020-05-28 MED ORDER — STERILE WATER FOR INJECTION IJ SOLN
INTRAMUSCULAR | Status: AC
  Administered 2020-05-28: 10 mL
  Filled 2020-05-28: qty 10

## 2020-05-28 MED ORDER — LORAZEPAM 2 MG/ML IJ SOLN
1.0000 mg | INTRAMUSCULAR | Status: DC | PRN
  Administered 2020-05-28 (×4): 1 mg via INTRAVENOUS
  Filled 2020-05-28 (×4): qty 1

## 2020-05-28 MED ORDER — ACETAMINOPHEN 650 MG RE SUPP: 650.0000 mg | Freq: Four times a day (QID) | RECTAL | Status: DC | PRN

## 2020-05-28 MED ORDER — PRAVASTATIN SODIUM 20 MG PO TABS
40.0000 mg | ORAL_TABLET | Freq: Every day | ORAL | Status: DC
  Administered 2020-05-29 – 2020-05-31 (×3): 40 mg via ORAL
  Filled 2020-05-28 (×3): qty 2

## 2020-05-28 MED ORDER — ONDANSETRON HCL 4 MG PO TABS: 4.0000 mg | ORAL_TABLET | Freq: Four times a day (QID) | ORAL | Status: DC | PRN

## 2020-05-28 MED ORDER — INSULIN GLARGINE 100 UNIT/ML ~~LOC~~ SOLN
20.0000 [IU] | Freq: Every day | SUBCUTANEOUS | Status: DC
  Administered 2020-05-28 (×2): 20 [IU] via SUBCUTANEOUS
  Filled 2020-05-28 (×2): qty 0.2

## 2020-05-28 MED ORDER — TRAZODONE HCL 50 MG PO TABS
25.0000 mg | ORAL_TABLET | Freq: Every evening | ORAL | Status: DC | PRN
  Administered 2020-05-28 – 2020-05-31 (×3): 25 mg via ORAL
  Filled 2020-05-28 (×3): qty 1

## 2020-05-28 MED ORDER — HALOPERIDOL LACTATE 5 MG/ML IJ SOLN
5.0000 mg | Freq: Two times a day (BID) | INTRAMUSCULAR | Status: DC
  Administered 2020-05-28 – 2020-05-30 (×6): 5 mg via INTRAVENOUS
  Filled 2020-05-28 (×8): qty 1

## 2020-05-28 MED ORDER — ZIPRASIDONE MESYLATE 20 MG IM SOLR
10.0000 mg | Freq: Four times a day (QID) | INTRAMUSCULAR | Status: DC | PRN
  Administered 2020-05-28 – 2020-05-29 (×2): 20 mg via INTRAMUSCULAR
  Filled 2020-05-28 (×4): qty 20

## 2020-05-28 MED ORDER — GLUCOSE 4 G PO CHEW: 16.0000 | CHEWABLE_TABLET | ORAL | Status: DC | PRN

## 2020-05-28 MED ORDER — GABAPENTIN 300 MG PO CAPS
600.0000 mg | ORAL_CAPSULE | Freq: Three times a day (TID) | ORAL | Status: DC
  Administered 2020-05-28 – 2020-05-31 (×8): 600 mg via ORAL
  Filled 2020-05-28 (×9): qty 2

## 2020-05-28 MED ORDER — MELATONIN 3 MG PO TABS
6.0000 mg | ORAL_TABLET | Freq: Every day | ORAL | Status: DC
  Administered 2020-05-28 – 2020-05-30 (×3): 6 mg via ORAL
  Filled 2020-05-28 (×3): qty 2

## 2020-05-28 NOTE — ED Notes (Signed)
Pt cont with incomprehensible speech. Brief changed, pt cleaned up, linen changed.

## 2020-05-28 NOTE — ED Notes (Signed)
Patient alert, but disoriented x4. Patient keeps yelling "susan" and pulling at his penis. Patients bilateral pupils are 15mm.

## 2020-05-28 NOTE — ED Notes (Signed)
Mittens placed on patient. Patient pulled out IV's, cardiac monitoring, pulse oximetry cords, primofit. Patient needs IV medication and mittens applied for his safety.

## 2020-05-28 NOTE — ED Notes (Signed)
MD paged about pt level of care.

## 2020-05-28 NOTE — ED Notes (Signed)
Pt not able to tolerate POs at this time. MD aware, at bedside at this time.

## 2020-05-28 NOTE — ED Notes (Signed)
Pt in bed, speaking incoherently, not able to follow commands at this time. Mitts in place. Pt trying to catch object in air that are not there.

## 2020-05-28 NOTE — ED Notes (Signed)
Received call from patient's wife.  Informed her that patient has moved to room 7 and will be admitted to hospital for treatment.

## 2020-05-28 NOTE — ED Notes (Signed)
His TTS consult can be removed and once on medical floor staff can put in a psych consult per Jenny Reichmann, Counselor.

## 2020-05-28 NOTE — ED Notes (Signed)
Pt less responded well to haldol. Sleeping now.

## 2020-05-28 NOTE — ED Notes (Signed)
CBG 156 

## 2020-05-28 NOTE — Progress Notes (Signed)
PROGRESS NOTE    Lucas White  VVO:[id number] DOB: 03-Mar-1960 DOA: 05/27/2020 PCP: [name], MD   Brief Narrative:   Lucas White is a 60 y.o. African-American male with medical history significant for type II diabetes mellitus, chronic back pain and COVID-19, presented to the ER with acute onset of altered mental status with delirium.  The patient was found in the street wandering and talking nonsense.  He was lying on the grass. He was combative was noted to have aggressive behavior.  He kept cursing consistently threatening caregivers demanding to be left alone.  He was having auditory hallucinations.  He required IVC and sedation.  No history could be obtained from the patient as he was fairly lethargic and confused.  ED Course: When he came to the ER blood pressure was 133/94 but otherwise normal vital signs.  Urine drug screen was positive for benzodiazepines.  Salicylate levels less than 7 and UA was unremarkable.  Follows antigens and COVID-19 PCR came back negative.  TSH was 0.23 and free T4 was 0.8.  Tylenol levels less than 10.  CBC was remarkable for mild anemia and CMP was remarkable for hyperglycemia without 64.  Ammonia level was 21  Assessment & Plan:   Active Problems:   Alcohol withdrawal (HCC)   Alcohol withdrawal delirium (HCC)  Acute encephalopathy with aggressive behavior: I just had a very long discussion with patient's wife over the phone.  She tells me that patient does not drink alcohol at all.  Last time he had a drink was in 2014.  She is surprised that people are resuming that he drinks.  She does not think he is going through alcohol withdrawal syndrome.  She tells me that even at home, has intermittent behavioral aggression, distrust and paranoid behavior and he is verbally and emotionally abusive at home as well.  Based off of this information, I will discontinue CIWA protocol and as needed Ativan.  Psychiatry has seen him as well however he is not  coherent enough to be interviewed by them and they have started him on antipsychotic and sedative medications which are Haldol and Ativan and Geodon.  Continue Recruitment consultant.  We will wait for him to turn around.  He apparently was admitted recently with a similar situation and turned around.  Uncontrolled type 2 diabetes mellitus with hyperglycemia: Blood sugar acceptable.  We will continue Lantus 20 units nightly and SSI with discontinue glipizide as sulfonylureas are not indicated to continue as inpatient.  Hyperlipidemia: Continue statin.  Essential hypertension: Controlled.  Continue diltiazem.  Depression: Continue Lexapro.  DVT prophylaxis: enoxaparin (LOVENOX) injection 40 mg Start: 05/28/20 1000   Code Status: Full Code  Family Communication: None present at bedside.  Plan of care discussed with his wife over the phone  Status is: Inpatient  Remains inpatient appropriate because:Inpatient level of care appropriate due to severity of illness   Dispo: The patient is from: Home              Anticipated d/c is to: Home              Patient currently is not medically stable to d/c.   Difficult to place patient No        Estimated body mass index is 27.67 kg/m as calculated from the following:   Height as of 02/17/20: 5\' 11"  (1.803 m).   Weight as of 02/17/20: 90 kg.      Nutritional status:  Consultants:   Psychiatry  Procedures:   Plan  Antimicrobials:  Anti-infectives (From admission, onward)   None         Subjective: Patient seen and examined in the emergency department.  Patient had hand mittens.  He was completely incoherent but alert.  He kept talking " is he taking girlfriend" repeatedly.  Could not participate in any sort of conversation.  Seemed to be having auditory and visual hallucinations.  Objective: Vitals:   05/28/20 0600 05/28/20 0630 05/28/20 0949 05/28/20 1328  BP: (!) 132/119 (!) 147/97 (!) 145/84 (!) 152/78   Pulse: 70 66 85 82  Resp: (!) 21 20 (!) 28 (!) 24  Temp:      TempSrc:      SpO2: 94% 95% 98% 100%    Intake/Output Summary (Last 24 hours) at 05/28/2020 1406 Last data filed at 05/28/2020 1401 Gross per 24 hour  Intake 1000 ml  Output --  Net 1000 ml   There were no vitals filed for this visit.  Examination:  General exam: Appears jittery and incoherent but alert. Respiratory system: Clear to auscultation. Respiratory effort normal. Cardiovascular system: S1 & S2 heard, RRR. No JVD, murmurs, rubs, gallops or clicks. No pedal edema. Gastrointestinal system: Abdomen is nondistended, soft and nontender. No organomegaly or masses felt. Normal bowel sounds heard. Central nervous system: Alert but completely disoriented.  No focal deficit.   Data Reviewed: I have personally reviewed following labs and imaging studies  CBC: Recent Labs  Lab 05/27/20 0947 05/28/20 0330  WBC 8.4 12.3*  NEUTROABS 6.4  --   HGB 11.8* 13.3  HCT 35.4* 39.4  MCV 85.9 85.7  PLT 574* 611*   Basic Metabolic Panel: Recent Labs  Lab 05/27/20 0947 05/28/20 0330  NA 137 141  K 3.5 3.7  CL 99 107  CO2 28 25  GLUCOSE 264* 165*  BUN 7 7  CREATININE 1.01 0.87  CALCIUM 9.3 9.5   GFR: CrCl cannot be calculated (Unknown ideal weight.). Liver Function Tests: Recent Labs  Lab 05/27/20 0947 05/28/20 0330  AST 27 34  ALT 19 20  ALKPHOS 115 129*  BILITOT 0.4 0.6  PROT 7.8 8.2*  ALBUMIN 3.9 4.0   No results for input(s): LIPASE, AMYLASE in the last 168 hours. Recent Labs  Lab 05/27/20 1047  AMMONIA 21   Coagulation Profile: No results for input(s): INR, PROTIME in the last 168 hours. Cardiac Enzymes: No results for input(s): CKTOTAL, CKMB, CKMBINDEX, TROPONINI in the last 168 hours. BNP (last 3 results) No results for input(s): PROBNP in the last 8760 hours. HbA1C: No results for input(s): HGBA1C in the last 72 hours. CBG: Recent Labs  Lab 05/27/20 1001 05/28/20 0300 05/28/20 0745  05/28/20 1331  GLUCAP 248* 156* 165* 174*   Lipid Profile: No results for input(s): CHOL, HDL, LDLCALC, TRIG, CHOLHDL, LDLDIRECT in the last 72 hours. Thyroid Function Tests: Recent Labs    05/27/20 1612 05/27/20 1613  TSH 0.236*  --   FREET4  --  0.80   Anemia Panel: No results for input(s): VITAMINB12, FOLATE, FERRITIN, TIBC, IRON, RETICCTPCT in the last 72 hours. Sepsis Labs: No results for input(s): PROCALCITON, LATICACIDVEN in the last 168 hours.  Recent Results (from the past 240 hour(s))  Resp Panel by RT-PCR (Flu A&B, Covid) Nasopharyngeal Swab     Status: None   Collection Time: 05/27/20  5:30 PM   Specimen: Nasopharyngeal Swab; Nasopharyngeal(NP) swabs in vial transport medium  Result Value Ref Range Status  SARS Coronavirus 2 by RT PCR NEGATIVE NEGATIVE Final    Comment: (NOTE) SARS-CoV-2 target nucleic acids are NOT DETECTED.  The SARS-CoV-2 RNA is generally detectable in upper respiratory specimens during the acute phase of infection. The lowest concentration of SARS-CoV-2 viral copies this assay can detect is 138 copies/mL. A negative result does not preclude SARS-Cov-2 infection and should not be used as the sole basis for treatment or other patient management decisions. A negative result may occur with  improper specimen collection/handling, submission of specimen other than nasopharyngeal swab, presence of viral mutation(s) within the areas targeted by this assay, and inadequate number of viral copies(<138 copies/mL). A negative result must be combined with clinical observations, patient history, and epidemiological information. The expected result is Negative.  Fact Sheet for Patients:  BloggerCourse.com  Fact Sheet for Healthcare Providers:  SeriousBroker.it  This test is no t yet approved or cleared by the [place] FDA and  has been authorized for detection and/or diagnosis of SARS-CoV-2 by FDA  under an Emergency Use Authorization (EUA). This EUA will remain  in effect (meaning this test can be used) for the duration of the COVID-19 declaration under Section 564(b)(1) of the Act, 21 U.S.C.section 360bbb-3(b)(1), unless the authorization is terminated  or revoked sooner.       Influenza A by PCR NEGATIVE NEGATIVE Final   Influenza B by PCR NEGATIVE NEGATIVE Final    Comment: (NOTE) The Xpert Xpress SARS-CoV-2/FLU/RSV plus assay is intended as an aid in the diagnosis of influenza from Nasopharyngeal swab specimens and should not be used as a sole basis for treatment. Nasal washings and aspirates are unacceptable for Xpert Xpress SARS-CoV-2/FLU/RSV testing.  Fact Sheet for Patients: BloggerCourse.com  Fact Sheet for Healthcare Providers: SeriousBroker.it  This test is not yet approved or cleared by the [place] FDA and has been authorized for detection and/or diagnosis of SARS-CoV-2 by FDA under an Emergency Use Authorization (EUA). This EUA will remain in effect (meaning this test can be used) for the duration of the COVID-19 declaration under Section 564(b)(1) of the Act, 21 U.S.C. section 360bbb-3(b)(1), unless the authorization is terminated or revoked.  Performed at [facility], [address] [place], [place] [address]       Radiology Studies: DG Chest 1 View  Result Date: 05/27/2020 CLINICAL DATA:  Abnormal apices seen on CT. EXAM: CHEST  1 VIEW COMPARISON:  Radiograph of February 17, 2020.  CT scan of same day. FINDINGS: The heart size and mediastinal contours are within normal limits. No pneumothorax or pleural effusion is noted. Stable elevated right hemidiaphragm is noted with minimal right basilar subsegmental atelectasis. Left lung is clear. The visualized skeletal structures are unremarkable. IMPRESSION: Stable elevated right hemidiaphragm with minimal right basilar subsegmental  atelectasis. Electronically Signed   By: Lupita Raider M.D.   On: 05/27/2020 16:40   CT Head Wo Contrast  Result Date: 05/27/2020 CLINICAL DATA:  Fall. EXAM: CT HEAD WITHOUT CONTRAST CT CERVICAL SPINE WITHOUT CONTRAST TECHNIQUE: Multidetector CT imaging of the head and cervical spine was performed following the standard protocol without intravenous contrast. Multiplanar CT image reconstructions of the cervical spine were also generated. COMPARISON:  CT head 12/13/2018 FINDINGS: CT HEAD FINDINGS Motion limited exam. Within this limitation: Brain: No evidence of acute infarction, hemorrhage, hydrocephalus, extra-axial collection or mass lesion/mass effect. Vascular: No hyperdense vessel identified. Calcific atherosclerosis. Skull: No acute fracture.  Right frontal scalp contusion. Sinuses/Orbits: Remote displaced bilateral medial orbital wall fractures. Otherwise, unremarkable  orbits. Right frontal sinus osteoma. Otherwise, visualized sinuses are clear. Other: No mastoid effusions. CT CERVICAL SPINE FINDINGS Motion limited exam.  Within this limitation: Alignment: No substantial sagittal subluxation. Broad dextrocurvature. Skull base and vertebrae: No evidence of acute fracture. Vertebral body heights appear grossly maintained on the motion limited sagittal sequence. Soft tissues and spinal canal: No obvious prevertebral fluid or swelling. No visible canal hematoma. Disc levels:  Mild multilevel degenerative change. Upper chest: Ground-glass opacities in the right lung apex. Other: 1.6 cm right thyroid nodule. Nonspecific ground-glass opacities in the visualized right lung apex IMPRESSION: CT head: 1. No evidence of acute intracranial abnormality on this motion limited exam. 2. Right frontal scalp contusion without acute fracture. 3. Bilateral remote displaced medial orbital wall fractures. CT cervical spine: 1. No evidence of acute fracture or traumatic malalignment on this motion limited exam. 2. Nonspecific  ground-glass opacities in the visualized right lung apex. Recommend dedicated chest radiograph to further evaluate. 3. 1.6 cm right thyroid nodule. Recommend non urgent thyroid ultrasound follow-up to further characterize. Electronically Signed   By: Feliberto Harts MD   On: 05/27/2020 15:40   CT Cervical Spine Wo Contrast  Result Date: 05/27/2020 CLINICAL DATA:  Fall. EXAM: CT HEAD WITHOUT CONTRAST CT CERVICAL SPINE WITHOUT CONTRAST TECHNIQUE: Multidetector CT imaging of the head and cervical spine was performed following the standard protocol without intravenous contrast. Multiplanar CT image reconstructions of the cervical spine were also generated. COMPARISON:  CT head 12/13/2018 FINDINGS: CT HEAD FINDINGS Motion limited exam. Within this limitation: Brain: No evidence of acute infarction, hemorrhage, hydrocephalus, extra-axial collection or mass lesion/mass effect. Vascular: No hyperdense vessel identified. Calcific atherosclerosis. Skull: No acute fracture.  Right frontal scalp contusion. Sinuses/Orbits: Remote displaced bilateral medial orbital wall fractures. Otherwise, unremarkable orbits. Right frontal sinus osteoma. Otherwise, visualized sinuses are clear. Other: No mastoid effusions. CT CERVICAL SPINE FINDINGS Motion limited exam.  Within this limitation: Alignment: No substantial sagittal subluxation. Broad dextrocurvature. Skull base and vertebrae: No evidence of acute fracture. Vertebral body heights appear grossly maintained on the motion limited sagittal sequence. Soft tissues and spinal canal: No obvious prevertebral fluid or swelling. No visible canal hematoma. Disc levels:  Mild multilevel degenerative change. Upper chest: Ground-glass opacities in the right lung apex. Other: 1.6 cm right thyroid nodule. Nonspecific ground-glass opacities in the visualized right lung apex IMPRESSION: CT head: 1. No evidence of acute intracranial abnormality on this motion limited exam. 2. Right frontal  scalp contusion without acute fracture. 3. Bilateral remote displaced medial orbital wall fractures. CT cervical spine: 1. No evidence of acute fracture or traumatic malalignment on this motion limited exam. 2. Nonspecific ground-glass opacities in the visualized right lung apex. Recommend dedicated chest radiograph to further evaluate. 3. 1.6 cm right thyroid nodule. Recommend non urgent thyroid ultrasound follow-up to further characterize. Electronically Signed   By: Feliberto Harts MD   On: 05/27/2020 15:40    Scheduled Meds: . cholecalciferol  1,000 Units Oral Daily  . diltiazem  180 mg Oral Daily  . enoxaparin (LOVENOX) injection  40 mg Subcutaneous Q24H  . escitalopram  20 mg Oral Daily  . gabapentin  600 mg Oral TID  . haloperidol lactate  5 mg Intravenous BID  . insulin aspart  0-15 Units Subcutaneous TID AC & HS  . insulin glargine  20 Units Subcutaneous QHS  . melatonin  6 mg Oral QHS  . pravastatin  40 mg Oral Daily   Continuous Infusions:   LOS: 0 days  Time spent: 38-minute   Lucas Closs, MD [facility]  05/28/2020, 2:06 PM   How to contact the [facility] Attending or Consulting provider 7A - 7P or covering provider during after hours 7P -7A, for this patient?  1. Check the care team in [facility] and look for a) attending/consulting [facility] provider listed and b) the [facility] team listed. Page or secure chat 7A-7P. 2. Log into [email or web address] and use [facility]'s universal password to access. If you do not have the password, please contact the hospital operator. 3. Locate the [facility] provider you are looking for under [facility] and page to a number that you can be directly reached. 4. If you still have difficulty reaching the provider, please page the [facility] (Director on Call) for the Hospitalists listed on amion for assistance.

## 2020-05-28 NOTE — BH Assessment (Addendum)
Per Cathie Olden, RN to to be admitted to the Telemetry Floor. Clinician expressed TTS can not assess pt's on the medical floor but psychiatry can be consulted. Per RN, pt is cannot hold a conversation and is very altered.  Clinician message Cathie Olden, RN via secure chat that the pt's TTS assessment can be removed and once pt is place on medical floor a psych consult can be put in.   Redmond Pulling, MS, [facility], [facility] Triage Specialist [phone]

## 2020-05-28 NOTE — Progress Notes (Signed)
Pt belongings returned from emergency room to room 1222.

## 2020-05-28 NOTE — Consult Note (Addendum)
Lucas White is a 60 y.o. African-American male with medical history significant for type II diabetes mellitus, alcohol abuse, chronic back pain and COVID-19, presented to the ER with acute onset of altered mental status with delirium.  The patient was found in the street wandering and talking nonsense.  He was lying on the grass.  He endorsed alcohol intake.  His wife came to the ER became agitated and combative was noted to have aggressive behavior.  He kept cursing consistently threatening caregivers demanding to be left alone.  He was having auditory hallucinations.  He required IVC and sedation.  No history could be obtained from the patient as he was fairly lethargic and confused.  Psych consult place for delirium.  Patient continues to present as confused, and is unable to participate in psychiatric evaluation at this time.  Patient appears to be encephalopathic, as he is now unable to follow commands, incomprehensible speech, and difficult to arouse.  Chart review indicates urine drug screen was positive for benzodiazepines(unknown if he received any medications in the field when EMS arrived), TSH is 0.23, and free T4 was 0.8.  Chart review also shows patient was recently admitted 3 months ago for similar presentation, in which she responded to Haldol 5 mg p.o. twice daily, with Ativan 1 mg p.o. twice daily.   -Patient is currently being admitted to the medical floor.  Patient is unable to swallow medications at this time.  Will start Haldol 5 mg IV twice daily, and Ativan 1 mg IV, this may be switched to oral medication once patient is able to tolerate p.o. meds. -Will recommend safety sitter as patient continues to have fluctuation in cognitive levels, altered sensorium, and disorientation.  Patient is at high risk for developing delirium tremens. -Please place new psych consult once patient is able to participate in psychiatric evaluation.

## 2020-05-28 NOTE — ED Notes (Signed)
Writer called patient's wife to update her on patient's status.  HIPAA compliant VM was left for wife to return call.

## 2020-05-28 NOTE — H&P (Addendum)
[facility]   PATIENT NAME: Lucas White    MR#:  [id number]  DATE OF BIRTH:  [birth year]  DATE OF ADMISSION:  05/27/2020  PRIMARY CARE PHYSICIAN: [name], MD   Patient is coming from: Home.  REQUESTING/REFERRING PHYSICIAN: Liberty Handy, PA-C  CHIEF COMPLAINT:   Chief Complaint  Patient presents with  . Altered Mental Status    HISTORY OF PRESENT ILLNESS:  Lucas White is a 60 y.o. African-American male with medical history significant for type II diabetes mellitus, alcohol abuse, chronic back pain and COVID-19, presented to the ER with acute onset of altered mental status with delirium.  The patient was found in the street wandering and talking nonsense.  He was lying on the grass.  He endorsed alcohol intake.  His wife came to the ER became agitated and combative was noted to have aggressive behavior.  He kept cursing consistently threatening caregivers demanding to be left alone.  He was having auditory hallucinations.  He required IVC and sedation.  No history could be obtained from the patient as he was fairly lethargic and confused.  ED Course: When he came to the ER blood pressure was 133/94 but otherwise normal vital signs.  Urine drug screen was positive for benzodiazepines.  Salicylate levels less than 7 and UA was unremarkable.  Follows antigens and COVID-19 PCR came back negative.  TSH was 0.23 and free T4 was 0.8.  Tylenol levels less than 10.  CBC was remarkable for mild anemia and CMP was remarkable for hyperglycemia without 64.  Ammonia level was 21  Imaging: Chest x-ray showed stable elevated right hemidiaphragm with minimal right basal subsegmental atelectasis. Head and C-spine CT showed no evidence for acute intracranial normalities, right frontal scalp contusion without fracture and bilateral remote displaced medial orbital wall fractures.  There was no acute fracture or traumatic malalignment of the C-spine.  It showed nonspecific groundglass  opacities of the visualized right lung apex and 1.6 cm right thyroid nodule with recommendation for nonemergent thyroid ultrasound for further characterization.  The patient was given 1 mg of IV Ativan x3 and later another 2 mg.  Is also given 100 mg IV thiamine as well as 1 L bolus of IV normal saline and 20 mg of IM Geodon that apparently helped with sedation.  He will be admitted to a telemetry bed for further evaluation and management. PAST MEDICAL HISTORY:   Past Medical History:  Diagnosis Date  . Chronic back pain greater than 3 months duration   . Obesity (BMI 30.0-34.9)   . Pneumonia due to COVID-19 virus 12/19/2018  . Type 2 diabetes mellitus (HCC)     PAST SURGICAL HISTORY:   Past Surgical History:  Procedure Laterality Date  . BACK SURGERY    . COLONOSCOPY      SOCIAL HISTORY:   Social History   Tobacco Use  . Smoking status: Never Smoker  . Smokeless tobacco: Never Used  Substance Use Topics  . Alcohol use: No    FAMILY HISTORY:   Family History  Problem Relation Age of Onset  . Diabetes Father     DRUG ALLERGIES:  No Known Allergies  REVIEW OF SYSTEMS:   ROS As per history of present illness. All pertinent systems were reviewed above. Constitutional, HEENT, cardiovascular, respiratory, GI, GU, musculoskeletal, neuro, psychiatric, endocrine, integumentary and hematologic systems were reviewed and are otherwise negative/unremarkable except for positive findings mentioned above in the HPI.   MEDICATIONS AT HOME:  Prior to Admission medications   Medication Sig Start Date End Date Taking? Authorizing Provider  cholecalciferol (VITAMIN D3) 25 MCG (1000 UNIT) tablet Take 1,000 Units by mouth daily.    [provider]  diltiazem (TIAZAC) 180 MG 24 hr capsule Take 180 mg by mouth daily.    [provider]  escitalopram (LEXAPRO) 20 MG tablet Take 20 mg by mouth daily.    [provider]  gabapentin (NEURONTIN) 300 MG capsule  Take 2 capsules (600 mg total) by mouth 3 (three) times daily. 02/23/20 03/24/20  Almon HerculesGonfa, Taye T, MD  glipiZIDE (GLUCOTROL) 10 MG tablet Take 10 mg by mouth daily before breakfast.    [provider]  glucose 4 GM chewable tablet Chew 16 tablets by mouth as needed for low blood sugar (BS less than 70).    [provider]  ibuprofen (ADVIL) 600 MG tablet Take 1 tablet (600 mg total) by mouth every 6 (six) hours as needed for up to 21 doses. 03/03/20   Cheryll CockayneHong, Joshua S, MD  insulin glargine (LANTUS) 100 UNIT/ML injection Inject 20 Units into the skin at bedtime.    [provider]  melatonin 3 MG TABS tablet Take 6 mg by mouth at bedtime.    [provider]  metFORMIN (GLUCOPHAGE) 500 MG tablet Take 500 mg by mouth 2 (two) times daily with a meal.    [provider]  pravastatin (PRAVACHOL) 40 MG tablet Take 40 mg by mouth daily.    [provider]  sildenafil (VIAGRA) 100 MG tablet Take 100 mg by mouth daily as needed for erectile dysfunction.    [provider]      VITAL SIGNS:  Blood pressure 123/84, pulse 90, temperature 97.8 F (36.6 C), temperature source Axillary, resp. rate 18, SpO2 96 %.  PHYSICAL EXAMINATION:  Physical Exam  GENERAL:  60 y.o.-year-old African-American male patient lying in the bed with no acute distress.  He was somnolent, lethargic and globally confused. EYES: Pupils equal, round, reactive to light and accommodation. No scleral icterus. Extraocular muscles intact.  HEENT: Head atraumatic, normocephalic. Oropharynx and nasopharynx clear.  NECK:  Supple, no jugular venous distention. No thyroid enlargement, no tenderness.  LUNGS: Normal breath sounds bilaterally, no wheezing, rales,rhonchi or crepitation. No use of accessory muscles of respiration.  CARDIOVASCULAR: Regular rate and rhythm, S1, S2 normal. No murmurs, rubs, or gallops.  ABDOMEN: Soft, nondistended, nontender. Bowel sounds present. No organomegaly  or mass.  EXTREMITIES: No pedal edema, cyanosis, or clubbing.  NEUROLOGIC: Cranial nerves II through XII are intact. Muscle strength 5/5 in all extremities. Sensation intact. Gait not checked.  PSYCHIATRIC: The patient is alert and lethargic.  He was not cooperative with exam.   SKIN: No obvious rash, lesion, or ulcer.   LABORATORY PANEL:   CBC Recent Labs  Lab 05/27/20 0947  WBC 8.4  HGB 11.8*  HCT 35.4*  PLT 574*   ------------------------------------------------------------------------------------------------------------------  Chemistries  Recent Labs  Lab 05/27/20 0947  NA 137  K 3.5  CL 99  CO2 28  GLUCOSE 264*  BUN 7  CREATININE 1.01  CALCIUM 9.3  AST 27  ALT 19  ALKPHOS 115  BILITOT 0.4   ------------------------------------------------------------------------------------------------------------------  Cardiac Enzymes No results for input(s): TROPONINI in the last 168 hours. ------------------------------------------------------------------------------------------------------------------  RADIOLOGY:  DG Chest 1 View  Result Date: 05/27/2020 CLINICAL DATA:  Abnormal apices seen on CT. EXAM: CHEST  1 VIEW COMPARISON:  Radiograph of February 17, 2020.  CT scan of  same day. FINDINGS: The heart size and mediastinal contours are within normal limits. No pneumothorax or pleural effusion is noted. Stable elevated right hemidiaphragm is noted with minimal right basilar subsegmental atelectasis. Left lung is clear. The visualized skeletal structures are unremarkable. IMPRESSION: Stable elevated right hemidiaphragm with minimal right basilar subsegmental atelectasis. Electronically Signed   By: Lupita Raider M.D.   On: 05/27/2020 16:40   CT Head Wo Contrast  Result Date: 05/27/2020 CLINICAL DATA:  Fall. EXAM: CT HEAD WITHOUT CONTRAST CT CERVICAL SPINE WITHOUT CONTRAST TECHNIQUE: Multidetector CT imaging of the head and cervical spine was performed following the  standard protocol without intravenous contrast. Multiplanar CT image reconstructions of the cervical spine were also generated. COMPARISON:  CT head 12/13/2018 FINDINGS: CT HEAD FINDINGS Motion limited exam. Within this limitation: Brain: No evidence of acute infarction, hemorrhage, hydrocephalus, extra-axial collection or mass lesion/mass effect. Vascular: No hyperdense vessel identified. Calcific atherosclerosis. Skull: No acute fracture.  Right frontal scalp contusion. Sinuses/Orbits: Remote displaced bilateral medial orbital wall fractures. Otherwise, unremarkable orbits. Right frontal sinus osteoma. Otherwise, visualized sinuses are clear. Other: No mastoid effusions. CT CERVICAL SPINE FINDINGS Motion limited exam.  Within this limitation: Alignment: No substantial sagittal subluxation. Broad dextrocurvature. Skull base and vertebrae: No evidence of acute fracture. Vertebral body heights appear grossly maintained on the motion limited sagittal sequence. Soft tissues and spinal canal: No obvious prevertebral fluid or swelling. No visible canal hematoma. Disc levels:  Mild multilevel degenerative change. Upper chest: Ground-glass opacities in the right lung apex. Other: 1.6 cm right thyroid nodule. Nonspecific ground-glass opacities in the visualized right lung apex IMPRESSION: CT head: 1. No evidence of acute intracranial abnormality on this motion limited exam. 2. Right frontal scalp contusion without acute fracture. 3. Bilateral remote displaced medial orbital wall fractures. CT cervical spine: 1. No evidence of acute fracture or traumatic malalignment on this motion limited exam. 2. Nonspecific ground-glass opacities in the visualized right lung apex. Recommend dedicated chest radiograph to further evaluate. 3. 1.6 cm right thyroid nodule. Recommend non urgent thyroid ultrasound follow-up to further characterize. Electronically Signed   By: Feliberto Harts MD   On: 05/27/2020 15:40   CT Cervical Spine Wo  Contrast  Result Date: 05/27/2020 CLINICAL DATA:  Fall. EXAM: CT HEAD WITHOUT CONTRAST CT CERVICAL SPINE WITHOUT CONTRAST TECHNIQUE: Multidetector CT imaging of the head and cervical spine was performed following the standard protocol without intravenous contrast. Multiplanar CT image reconstructions of the cervical spine were also generated. COMPARISON:  CT head 12/13/2018 FINDINGS: CT HEAD FINDINGS Motion limited exam. Within this limitation: Brain: No evidence of acute infarction, hemorrhage, hydrocephalus, extra-axial collection or mass lesion/mass effect. Vascular: No hyperdense vessel identified. Calcific atherosclerosis. Skull: No acute fracture.  Right frontal scalp contusion. Sinuses/Orbits: Remote displaced bilateral medial orbital wall fractures. Otherwise, unremarkable orbits. Right frontal sinus osteoma. Otherwise, visualized sinuses are clear. Other: No mastoid effusions. CT CERVICAL SPINE FINDINGS Motion limited exam.  Within this limitation: Alignment: No substantial sagittal subluxation. Broad dextrocurvature. Skull base and vertebrae: No evidence of acute fracture. Vertebral body heights appear grossly maintained on the motion limited sagittal sequence. Soft tissues and spinal canal: No obvious prevertebral fluid or swelling. No visible canal hematoma. Disc levels:  Mild multilevel degenerative change. Upper chest: Ground-glass opacities in the right lung apex. Other: 1.6 cm right thyroid nodule. Nonspecific ground-glass opacities in the visualized right lung apex IMPRESSION: CT head: 1. No evidence of acute intracranial abnormality on this motion limited exam. 2. Right frontal scalp  contusion without acute fracture. 3. Bilateral remote displaced medial orbital wall fractures. CT cervical spine: 1. No evidence of acute fracture or traumatic malalignment on this motion limited exam. 2. Nonspecific ground-glass opacities in the visualized right lung apex. Recommend dedicated chest radiograph to  further evaluate. 3. 1.6 cm right thyroid nodule. Recommend non urgent thyroid ultrasound follow-up to further characterize. Electronically Signed   By: Feliberto Harts MD   On: 05/27/2020 15:40      IMPRESSION AND PLAN:  Active Problems:   Alcohol withdrawal (HCC)  1.  Altered mental status likely associated with alcohol withdrawal with alcoholic hallucinosis and differential diagnosis including opiate withdrawal. -The patient will be admitted to a telemetry bed. - He will be placed on as needed IM Geodon. - Psychiatry recommended outpatient follow-up though if he has no improvement of his mental status this can be called in a.m. - He was currently involuntarily committed. - Banana bag will be utilized daily. - He will be placed on as needed IV Ativan.  2.  Uncontrolled type II diabetes mellitus with hyperglycemia. - The patient will be placed on supplement coverage with NovoLog. - Will continue his basal coverage and glipizide.  3.  Dyslipidemia. - We will continue statin therapy.  4.  Essential hypertension. - We will continue cetirizine.  5.  Depression. - We will continue Lexapro.  DVT prophylaxis: Lovenox. Code Status: full code. Family Communication:  The plan of care was discussed in details with the patient (and family). I answered all questions. The patient agreed to proceed with the above mentioned plan. Further management will depend upon hospital course. Disposition Plan: Back to previous home environment Consults called: Psychiatry consult.   All the records are reviewed and case discussed with ED provider.  Status is: Inpatient  Remains inpatient appropriate because:Altered mental status, Ongoing diagnostic testing needed not appropriate for outpatient work up, Unsafe d/c plan, IV treatments appropriate due to intensity of illness or inability to take PO and Inpatient level of care appropriate due to severity of illness   Dispo: The patient is from: found  in the street              Anticipated d/c is to: psych facility              Patient currently is not medically stable to d/c.   Difficult to place patient No  TOTAL TIME TAKING CARE OF THIS PATIENT: 55 minutes.    Hannah Beat M.D on 05/28/2020 at 12:37 AM  [facility]   From 7 PM-7 AM, contact night-coverage [email or web address]  CC: Primary care physician; [name], MD

## 2020-05-29 DIAGNOSIS — F29 Unspecified psychosis not due to a substance or known physiological condition: Secondary | ICD-10-CM | POA: Diagnosis not present

## 2020-05-29 DIAGNOSIS — R441 Visual hallucinations: Secondary | ICD-10-CM

## 2020-05-29 DIAGNOSIS — F99 Mental disorder, not otherwise specified: Secondary | ICD-10-CM

## 2020-05-29 DIAGNOSIS — R440 Auditory hallucinations: Secondary | ICD-10-CM

## 2020-05-29 LAB — GLUCOSE, CAPILLARY
Glucose-Capillary: 160 mg/dL — ABNORMAL HIGH (ref 70–99)
Glucose-Capillary: 339 mg/dL — ABNORMAL HIGH (ref 70–99)
Glucose-Capillary: 272 mg/dL — ABNORMAL HIGH (ref 70–99)
Glucose-Capillary: 177 mg/dL — ABNORMAL HIGH (ref 70–99)

## 2020-05-29 LAB — BASIC METABOLIC PANEL
Anion gap: 8 (ref 5–15)
BUN: 6 mg/dL (ref 6–20)
CO2: 27 mmol/L (ref 22–32)
Calcium: 9.8 mg/dL (ref 8.9–10.3)
Chloride: 108 mmol/L (ref 98–111)
Creatinine, Ser: 0.92 mg/dL (ref 0.61–1.24)
GFR, Estimated: >60 mL/min (ref >60)
Glucose, Bld: 197 mg/dL — ABNORMAL HIGH (ref 70–99)
Potassium: 3.8 mmol/L (ref 3.5–5.1)
Sodium: 143 mmol/L (ref 135–145)

## 2020-05-29 LAB — MAGNESIUM: Magnesium: 2.4 mg/dL (ref 1.7–2.4)

## 2020-05-29 MED ORDER — INSULIN GLARGINE 100 UNIT/ML ~~LOC~~ SOLN
25.0000 [IU] | Freq: Every day | SUBCUTANEOUS | Status: DC
  Administered 2020-05-29 – 2020-05-30 (×2): 25 [IU] via SUBCUTANEOUS
  Filled 2020-05-29 (×2): qty 0.25

## 2020-05-29 MED ORDER — ACETAMINOPHEN 500 MG PO TABS
1000.0000 mg | ORAL_TABLET | Freq: Four times a day (QID) | ORAL | Status: DC | PRN
  Administered 2020-05-29: 1000 mg via ORAL
  Filled 2020-05-29: qty 2

## 2020-05-29 MED ORDER — ACETAMINOPHEN 650 MG RE SUPP: 650.0000 mg | Freq: Four times a day (QID) | RECTAL | Status: DC | PRN

## 2020-05-29 NOTE — Progress Notes (Addendum)
1930 RN receiving bed side shift report. Pt states he's in pain and wants home dose of oxycodone restarted. RN informed family and patient that the MD would be paged after receiving shift report. Family wanted MD paged stat. Pt's parents coming out of room to find RNs in a different patient's room to continue to ask about paging MD. RN stated that the MD would be paged after shift report, but pt's parents still upset and rising voice at [place], [place]. Pt's dad being verbally aggressive and got up in [place], RNs face about his son being in pain. Per Harrold Donath, dayshift RN pt had not been in any pain today until his parent's arrived.   1950 RN paged K. Ayere, APP for pain med orders. Per APP, we are not going to re-start narcotics at this time due to the patient's orientation status. Per psy, need to do an eval after patient is more coherent. Relayed information back to patient as family has left for the night. Pt upset and demanding RN give him his oxycodone. RN informed pt that he would be given medication that MD/APP has ordered him.  2000 RN assessed pt. Pt A&Ox1 to name only. Pt can follow some basic commands, pt is incoherent and can't keep eyes open at all times. Sitter at bedside. Fall precautions in place, [facility].   2100 RN back to room to medicate pt. Pt still upset about not getting oxycodone. Pt cooperative but keeps talking about things that don't make sense. Pt re-oriented. Pt gets agitated at times with RN and sitter. WCTM.   2130 Pt resting comfortably, WCTM.   2230 RN informed by sitter pt is attempting to leave, attempting to get OOB. RN to room, educated patient about not getting OOB and concerned about him falling. Pt stating that we need to call his mother to pick him up. Informed pt he can't leave now, but to try to relax and go to bed. Pt getting agitated and restless. RN medicated per MAR. WCTM.   2320 RN back to room, pt is tossing and turning in bed, again speaking incoherently. Sitter at  bedside informed RN that pt keeps awakening and attempting to get out of bed, has to be continually re-directed to lay down, keep legs, and bed and try to get some rest. Fall precautions in place, [facility].
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
## 2020-05-29 NOTE — Progress Notes (Signed)
Patient remained calm throughout shift until patients parents came and began to stimulate patient. Parents repeatedly questioned if the patient use of opioids was the cause of his sudden and drastic change in mental status.    Once parents began to question son about opioid use the patient began to request oxycodone which he was on PTA.   Father at one point became agitated and aggressive with this nurse when it was suggested that we try to redirect the patient.  [facility] on-call is currently hold at home oxycodone due to the sudden change in mental status.   Upon parents departure patient began to become much less agitated.

## 2020-05-30 DIAGNOSIS — F4325 Adjustment disorder with mixed disturbance of emotions and conduct: Secondary | ICD-10-CM | POA: Diagnosis not present

## 2020-05-30 DIAGNOSIS — G9341 Metabolic encephalopathy: Secondary | ICD-10-CM | POA: Diagnosis not present

## 2020-05-30 LAB — GLUCOSE, CAPILLARY
Glucose-Capillary: 123 mg/dL — ABNORMAL HIGH (ref 70–99)
Glucose-Capillary: 119 mg/dL — ABNORMAL HIGH (ref 70–99)
Glucose-Capillary: 240 mg/dL — ABNORMAL HIGH (ref 70–99)
Glucose-Capillary: 430 mg/dL — ABNORMAL HIGH (ref 70–99)
Glucose-Capillary: 205 mg/dL — ABNORMAL HIGH (ref 70–99)

## 2020-05-30 LAB — BASIC METABOLIC PANEL
Anion gap: 6 (ref 5–15)
BUN: 8 mg/dL (ref 6–20)
CO2: 27 mmol/L (ref 22–32)
Calcium: 9.3 mg/dL (ref 8.9–10.3)
Chloride: 110 mmol/L (ref 98–111)
Creatinine, Ser: 0.86 mg/dL (ref 0.61–1.24)
GFR, Estimated: >60 mL/min (ref >60)
Glucose, Bld: 69 mg/dL — ABNORMAL LOW (ref 70–99)
Potassium: 3.2 mmol/L — ABNORMAL LOW (ref 3.5–5.1)
Sodium: 143 mmol/L (ref 135–145)

## 2020-05-30 MED ORDER — GLUCERNA SHAKE PO LIQD
237.0000 mL | Freq: Three times a day (TID) | ORAL | Status: DC
  Administered 2020-05-31: 237 mL via ORAL
  Filled 2020-05-30 (×5): qty 237

## 2020-05-30 MED ORDER — OXYCODONE HCL 5 MG PO TABS
10.0000 mg | ORAL_TABLET | Freq: Four times a day (QID) | ORAL | Status: DC | PRN
  Administered 2020-05-30 – 2020-05-31 (×4): 10 mg via ORAL
  Filled 2020-05-30 (×4): qty 2

## 2020-05-30 MED ORDER — OXYCODONE HCL 5 MG PO TABS
5.0000 mg | ORAL_TABLET | Freq: Once | ORAL | Status: AC
  Administered 2020-05-30: 5 mg via ORAL
  Filled 2020-05-30: qty 1

## 2020-05-30 MED ORDER — ADULT MULTIVITAMIN W/MINERALS CH
1.0000 | ORAL_TABLET | Freq: Every day | ORAL | Status: DC
  Administered 2020-05-30 – 2020-05-31 (×2): 1 via ORAL
  Filled 2020-05-30 (×2): qty 1

## 2020-05-30 MED ORDER — OXYCODONE HCL 5 MG PO TABS: 10.0000 mg | ORAL_TABLET | Freq: Once | ORAL | Status: DC

## 2020-05-30 MED ORDER — POTASSIUM CHLORIDE CRYS ER 20 MEQ PO TBCR
40.0000 meq | EXTENDED_RELEASE_TABLET | ORAL | Status: AC
  Administered 2020-05-30 (×2): 40 meq via ORAL
  Filled 2020-05-30 (×2): qty 2

## 2020-05-30 MED ORDER — LIP MEDEX EX OINT
TOPICAL_OINTMENT | CUTANEOUS | Status: DC | PRN
  Filled 2020-05-30: qty 7

## 2020-05-30 NOTE — Progress Notes (Signed)
Initial Nutrition Assessment  DOCUMENTATION CODES:   Not applicable  INTERVENTION:   Change diet to carb modified diet  Supplement thiamine and folic acid   Glucerna Shake po TID, each supplement provides 220 kcal and 10 grams of protein  MVI daily   NUTRITION DIAGNOSIS:   Increased nutrient needs related to acute illness as evidenced by estimated needs.  GOAL:   Patient will meet greater than or equal to 90% of their needs  MONITOR:   PO intake,Supplement acceptance,Weight trends,Labs,I & O's  REASON FOR ASSESSMENT:   Malnutrition Screening Tool    ASSESSMENT:   Patient with PMH significant for DM, essential HTN, and ETOH use. Presents this admission with ETOH withdrawal with delirium.  Unable to obtain nutrition and weight history at this time. Noted last meal completion charted as 100%. Recommend addition of thiamine and folic acid given history of ETOH use. RD to provide supplementation to maximize kcal and protein.   Weight history limited in records. Need admission weight to assess for weight loss/malnutrition.   Medications: SS novolog, lantus Labs: K 3.2 (L) CBG [id number]  Diet Order:   Diet Order            Diet Heart Room service appropriate? Yes; Fluid consistency: Thin  Diet effective now                 EDUCATION NEEDS:   Not appropriate for education at this time  Skin:  Skin Assessment: Reviewed RN Assessment  Last BM:  PTA  Height:   Ht Readings from Last 1 Encounters:  02/17/20 5\' 11"  (1.803 m)    Weight:   Wt Readings from Last 1 Encounters:  02/17/20 90 kg   BMI:  There is no height or weight on file to calculate BMI.  Estimated Nutritional Needs:   Kcal:  2250-2450 kcal  Protein:  115-130 grams  Fluid:  >/= 2 L/day  02/19/20 RD, LDN Clinical Nutrition Pager listed in AMION

## 2020-05-30 NOTE — Progress Notes (Addendum)
0010 Sitter called RN to room. Pt very agitated, pulling off EKG, pulling at condom cath. Pt can not be re-directed, not following directions, attempting to get OOB. RN previously gave haldol and geodon with little to no effect. APP paged for orders. Restraints not appropriate at this time, believe it will make pt more agitated and combative. Awaiting callback from APP. WCTM.  0015 APP to bedside to eval pt. Pt is finally resting. No new orders, WCTM.   0100 Pt still resting comfortably, has bouts of tossing and turning, waking up and talking. Can be re-directed to lay down and go back to sleep. WCTM.   0200 Pt still resting comfortably, has bouts of tossing and turning, waking up and talking. Can be re-directed to lay down and go back to sleep. WCTM.   0300 Pt still resting comfortably, has bouts of tossing and turning, waking up and talking. Can be re-directed to lay down and go back to sleep. WCTM.   Wells.Epley RN noted BS was borderline low on AM labs. Check BS 123. Pt still sleeping comfortably. BS soft, WCTM.

## 2020-05-30 NOTE — Progress Notes (Addendum)
Pt's wife stated only she can receive updates about pt's condition. MD notified. Charge Nurse notified. Sitter notified.
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
## 2020-05-30 NOTE — Progress Notes (Signed)
Pt since lunch has had 2 juices , an ice cream & a soda. CBG 430. MD notified. MD ordered to give 15 units of insulin.
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
## 2020-05-31 DIAGNOSIS — F10231 Alcohol dependence with withdrawal delirium: Secondary | ICD-10-CM | POA: Diagnosis not present

## 2020-05-31 DIAGNOSIS — F4325 Adjustment disorder with mixed disturbance of emotions and conduct: Secondary | ICD-10-CM | POA: Diagnosis not present

## 2020-05-31 LAB — CBC WITH DIFFERENTIAL/PLATELET
Abs Immature Granulocytes: 0.01 10*3/uL (ref 0.00–0.07)
Basophils Absolute: 0.1 10*3/uL (ref 0.0–0.1)
Basophils Relative: 1 %
Eosinophils Absolute: 0.2 10*3/uL (ref 0.0–0.5)
Eosinophils Relative: 3 %
HCT: 38 % — ABNORMAL LOW (ref 39.0–52.0)
Hemoglobin: 12.4 g/dL — ABNORMAL LOW (ref 13.0–17.0)
Immature Granulocytes: 0 %
Lymphocytes Relative: 41 %
Lymphs Abs: 3.2 10*3/uL (ref 0.7–4.0)
MCH: 28.8 pg (ref 26.0–34.0)
MCHC: 32.6 g/dL (ref 30.0–36.0)
MCV: 88.4 fL (ref 80.0–100.0)
Monocytes Absolute: 0.4 10*3/uL (ref 0.1–1.0)
Monocytes Relative: 5 %
Neutro Abs: 3.9 10*3/uL (ref 1.7–7.7)
Neutrophils Relative %: 50 %
Platelets: 566 10*3/uL — ABNORMAL HIGH (ref 150–400)
RBC: 4.3 MIL/uL (ref 4.22–5.81)
RDW: 13 % (ref 11.5–15.5)
WBC: 7.8 10*3/uL (ref 4.0–10.5)
nRBC: 0 % (ref 0.0–0.2)

## 2020-05-31 LAB — BASIC METABOLIC PANEL
Anion gap: 8 (ref 5–15)
BUN: 10 mg/dL (ref 6–20)
CO2: 27 mmol/L (ref 22–32)
Calcium: 9.2 mg/dL (ref 8.9–10.3)
Chloride: 102 mmol/L (ref 98–111)
Creatinine, Ser: 0.9 mg/dL (ref 0.61–1.24)
GFR, Estimated: >60 mL/min (ref >60)
Glucose, Bld: 181 mg/dL — ABNORMAL HIGH (ref 70–99)
Potassium: 3.6 mmol/L (ref 3.5–5.1)
Sodium: 137 mmol/L (ref 135–145)

## 2020-05-31 LAB — GLUCOSE, CAPILLARY
Glucose-Capillary: 193 mg/dL — ABNORMAL HIGH (ref 70–99)
Glucose-Capillary: 211 mg/dL — ABNORMAL HIGH (ref 70–99)

## 2020-05-31 LAB — MAGNESIUM: Magnesium: 2.1 mg/dL (ref 1.7–2.4)

## 2020-05-31 MED ORDER — HALOPERIDOL 1 MG PO TABS
5.0000 mg | ORAL_TABLET | Freq: Two times a day (BID) | ORAL | Status: DC
  Administered 2020-05-31: 5 mg via ORAL
  Filled 2020-05-31 (×2): qty 5

## 2020-05-31 MED ORDER — HALOPERIDOL 1 MG PO TABS: 2.0000 mg | ORAL_TABLET | Freq: Two times a day (BID) | ORAL | Status: DC

## 2020-05-31 MED ORDER — HALOPERIDOL 2 MG PO TABS: 2.0000 mg | ORAL_TABLET | Freq: Two times a day (BID) | ORAL | 0 refills | Status: DC

## 2020-05-31 MED ORDER — OLANZAPINE 5 MG PO TBDP
5.0000 mg | ORAL_TABLET | Freq: Two times a day (BID) | ORAL | Status: DC
  Filled 2020-05-31: qty 1

## 2020-05-31 NOTE — Progress Notes (Signed)
Pt confused, erratic behavior. Pt awakened from sleep needing to use the bathroom. As RN was unhooking monitor, pt stood up and urinated in a trash can in the room. Pt promptly returned to bed, drowsy. Will continue to monitor.

## 2020-05-31 NOTE — TOC Transition Note (Signed)
Transition of Care [facility]) - CM/SW Discharge Note   Patient Details  Name: Lucas White MRN: [id number] Date of Birth: 10-Jun-1960  [facility] CM/SW Contact:  Bartholome Bill, RN Phone Number: 05/31/2020, 12:29 PM   Clinical Narrative:    Notice of Commitment Change completed to rescind IVC. Paperwork faxed to [employer] [phone])     Barriers to Discharge: Continued Medical Work up,Family Issues   Patient Goals and CMS Choice Patient states their goals for this hospitalization and ongoing recovery are:: return home

## 2020-05-31 NOTE — Discharge Instructions (Signed)
Psychosis Psychosis, also called thought disturbance, refers to a severe loss of contact with reality. People having a psychotic episode are not able to think clearly, and their emotions and responses do not match with what is actually happening. People having a psychotic episode may have false beliefs about what is happening or who they are (delusions). They may see, hear, taste, smell, or feel things that are not present (hallucinations). They may also be very upset (agitated), have chaotic behavior, or be very quiet and withdrawn. What are the causes? This condition may be caused by:  Very serious mental health (psychiatric) conditions such as schizophrenia, bipolar disorder, or major depression.  Use of drugs such as hallucinogens or alcohol.  Medical conditions such as delirium or neurological disorders. What are the signs or symptoms? Symptoms of this condition include:  Delusions, such as: ? Feeling a lot of fear or suspicion (paranoia). ? Believing something that is odd, unrealistic, or false, such as believing that you are someone else.  Hallucinations, such as: ? Hearing or seeing things, smelling odors, experiencing tastes, or feeling bodily sensations. ? Command hallucinations that direct you to do something that could be dangerous.  Disorganized thinking, such as thoughts that jump from one idea to another in a way that does not make sense.  Disorganized speech, such as saying things that do not make sense, echoing others, or using words based on their sound rather than their meaning.  Inappropriate behavior, such as talking to yourself, showing a clear increase or decrease in activity, or intruding on unfamiliar people. How is this diagnosed? This condition is diagnosed based on an assessment by a health care provider.  The health care provider may ask questions about: ? Your thoughts, feelings, and behavior. ? Any medical conditions you have. ? Any use of alcohol or  drugs.  One or more of the following may also be done: ? A physical exam. ? Blood tests. ? Brain imaging, such as a CT scan or MRI. ? A brain wave study (electroencephalogram, or EEG). The health care provider may refer you to a mental health professional for further tests.   How is this treated? Treatment for this condition may depend on the cause of the psychosis. Treatment may include one or more of the following:  Supportive care and monitoring in the emergency room or hospital. You may need to stay in the hospital if you are a danger to yourself or others.  Taking antipsychotic medicines to reduce symptoms and to balance chemicals in the brain.  Treating an underlying medical condition.  Stopping or reducing drugs that are causing psychosis.  Therapy and other supportive programs, such as: ? Ongoing treatment and care from a mental health professional. ? Individual or family therapy. ? Training to learn new skills to cope with the psychosis and prevent further episodes.   Follow these instructions at home:  Take over-the-counter and prescription medicines only as told by your health care provider.  Consult a health care provider before taking over-the-counter medicines, herbs, or supplements.  Surround yourself with people who care about you and can help manage your condition.  Keep stress under control. Stress may trigger psychosis and make symptoms worse.  Maintain a healthy lifestyle. This includes: ? Eating a healthy diet. ? Getting enough sleep. ? Exercising regularly. ? Avoiding alcohol, nicotine, and recreational drugs.  Keep all follow-up visits as told by your health care provider. This is important. Contact a health care provider if:  Medicines do not   seem to be helping.  You or others notice that you: ? Continue to see, smell, or feel things that are not there. ? Hear voices telling you to do things. ? Feel extremely fearful and suspicious that someone  or something will harm you. ? Feel unable to leave your house. ? Have trouble taking care of yourself.  You have side effects of medicines, such as: ? Changes in sleep patterns. ? Dizziness. ? Weight gain. ? Restlessness. ? Movement changes. ? Shaking that you cannot control (tremors). Get help right away if:  You have serious side effects of medicine, such as: ? Swelling of the face, lips, tongue, or throat. ? Fever, confusion, muscle spasms, or seizures.  You have serious thoughts about harming yourself or hurting others. If you ever feel like you may hurt yourself or others, or have thoughts about taking your own life, get help right away. You can go to your nearest emergency department or call:  Your local emergency services (911 in the [place]  A suicide crisis helpline, such as the National Suicide Prevention Lifeline at 1-800-273-8255. This is open 24 hours a day. Summary  Psychosis refers to a severe loss of contact with reality. People having a psychotic episode are not able to think clearly, and they may have delusions or hallucinations.  Psychosis is a serious medical condition that should be treated by a medical professional as soon as possible. Being checked and treated right away can stop or reduce symptoms. This prevents more serious problems from developing.  In some cases, treatment may include taking antipsychotic medicines to reduce symptoms and to balance chemicals in the brain.  Support programs may help you learn new skills to cope with the psychosis and prevent further episodes. This information is not intended to replace advice given to you by your health care provider. Make sure you discuss any questions you have with your health care provider. Document Revised: 03/02/2017 Document Reviewed: 01/30/2017 Elsevier Patient Education  2021 [employer]  
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
## 2020-05-31 NOTE — Progress Notes (Signed)
Pt requested oxycodone at 1230. He reiceved his last dose at 0830 so he could not get it again until 1430. He became very agitated about this stating "I got it every 4 hours yesterday." This RN contacted Dr. Jacqulyn Bath regarding the situation. He said he has been getting it every 6 hours for the last few days. This RN relayed this message to the patient and he did not take it well. Then the Dr put in discharge orders and the patient was not happy about that either. He wanted to stay in the hospital but this RN explained that he was medically cleared and has no reason to stay. The patient eventually agreed to leave if he could get a way home. He was provided with paper scrubs and a bus voucher. He refused to let this RN go over his AVS with him but he took it. PIV was removed and the patient ambulated to the exit with the tech.

## 2020-05-31 NOTE — Consult Note (Signed)
  Please see psychiatric evaluation completed yesterday by Dr. Jannifer Franklin.  New psych consult placed for ongoing confusion and encephalopathy, with request to follow up with wife to obtain collateral information.  -60 year old African-American male with medical history significant for type 2 diabetes, chronic back pain on chronic opiates medication with dependence, who was admitted to [facility] due to acute onset of altered mental status with delirium.  This is patient's third hospitalization with similar presentation in which she becomes confused, disoriented, agitated and aggressive, and appears to respond to psychotropic medications in which he clears and then discharges home.  Patient was resumed on home medication escitalopram 20 mg p.o. daily, gabapentin 600 mg p.o. 3 times daily.  Also during this admission patient was stabilized on Haldol IM and Geodon IM.   Patient is seen and evaluated, as previously noted he is calm and cooperative alert and oriented x4.  He is also alert to situation, unable to recall events of this morning.  Patient denies anxiety, depression, psychosis, delusions, and self harming thoughts.  Patient is able to verbalize physical agitation and irritability, and when he is without his pain medication.  He denies any episodes of amnesia, confusion, hallucinations, and or delirium.  When discussing with patient his behaviors that led to this admission, he appears baffled.  Long discussion with patient about ongoing psychiatric evaluation to include neurocognitive evaluation and further neurological work-up to assess for any underlying and or organic causes of delirium and psychosis.  Patient is encouraged to continue taking Haldol 2 mg p.o. twice daily, an additional psychotropic medications after discharge to help with stabilization, confusion, and reducing any future psychiatric events until he is evaluated.  -Patient remains psychiatrically stable to discharge home. -As  previously noted patient will benefit from outpatient neurocognitive evaluation, and neuropsychiatric referral for further management of intermittent psychosis, delusions, and agitation.  Patient has had similar presentation in which she received EEG and CT scan both of which are normal.  Patient will likely benefit from MRI, and additional labs to assess for repeated psychosis and encephalopathy.  -Will place order for Haldol 2 mg p.o. twice daily, patient is to discharge home on this medication.  Also spoke with significant other [place], and discussed the importance with outpatient follow-up.  She did express concerns regarding patient's increasing agitation and aggressive behavior, however reports this is not a new finding this has been ongoing.  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  It should be noted that patient's last 2 presentations, urine drug screen was negative for opiates which may suggest cognitive impairment and psychosis when the offending agents are discontinued.  Patient may benefit from chronic opiate dependence treatment, to include Suboxone for pain management. -Psychiatry to sign off at this time as patient continues to be psychiatrically stable to discharge home with above recommendations.

## 2020-06-01 LAB — HEMOGLOBIN A1C
Hgb A1c MFr Bld: 12.1 % — ABNORMAL HIGH (ref 4.8–5.6)
Mean Plasma Glucose: 301 mg/dL

## 2020-06-29 ENCOUNTER — Emergency Department (HOSPITAL_COMMUNITY)
Admission: EM | Admit: 2020-06-29 | Discharge: 2020-06-29 | Disposition: A | Discharge status: Home / Self Care | Payer: Medicare Other | Attending: Emergency Medicine | Admitting: Emergency Medicine

## 2020-06-29 ENCOUNTER — Encounter (HOSPITAL_COMMUNITY): Payer: Self-pay | Admitting: Emergency Medicine

## 2020-06-29 DIAGNOSIS — Z794 Long term (current) use of insulin: Secondary | ICD-10-CM | POA: Insufficient documentation

## 2020-06-29 DIAGNOSIS — E1165 Type 2 diabetes mellitus with hyperglycemia: Secondary | ICD-10-CM | POA: Insufficient documentation

## 2020-06-29 DIAGNOSIS — Z8616 Personal history of COVID-19: Secondary | ICD-10-CM | POA: Diagnosis not present

## 2020-06-29 DIAGNOSIS — R739 Hyperglycemia, unspecified: Secondary | ICD-10-CM

## 2020-06-29 DIAGNOSIS — Z79899 Other long term (current) drug therapy: Secondary | ICD-10-CM | POA: Diagnosis not present

## 2020-06-29 DIAGNOSIS — Z7984 Long term (current) use of oral hypoglycemic drugs: Secondary | ICD-10-CM | POA: Insufficient documentation

## 2020-06-29 LAB — BASIC METABOLIC PANEL
Anion gap: 12 (ref 5–15)
BUN: 5 mg/dL — ABNORMAL LOW (ref 6–20)
CO2: 25 mmol/L (ref 22–32)
Calcium: 9.5 mg/dL (ref 8.9–10.3)
Chloride: 96 mmol/L — ABNORMAL LOW (ref 98–111)
Creatinine, Ser: 0.97 mg/dL (ref 0.61–1.24)
GFR, Estimated: >60 mL/min (ref >60)
Glucose, Bld: 457 mg/dL — ABNORMAL HIGH (ref 70–99)
Potassium: 4.1 mmol/L (ref 3.5–5.1)
Sodium: 133 mmol/L — ABNORMAL LOW (ref 135–145)

## 2020-06-29 LAB — URINALYSIS, ROUTINE W REFLEX MICROSCOPIC
Bacteria, UA: NONE SEEN
Bilirubin Urine: NEGATIVE
Glucose, UA: >=500 mg/dL — AB
Hgb urine dipstick: NEGATIVE
Ketones, ur: NEGATIVE mg/dL
Leukocytes,Ua: NEGATIVE
Nitrite: NEGATIVE
Protein, ur: NEGATIVE mg/dL
Specific Gravity, Urine: 1.031 — ABNORMAL HIGH (ref 1.005–1.030)
pH: 5 (ref 5.0–8.0)

## 2020-06-29 LAB — CBC
HCT: 40.1 % (ref 39.0–52.0)
Hemoglobin: 13 g/dL (ref 13.0–17.0)
MCH: 28.4 pg (ref 26.0–34.0)
MCHC: 32.4 g/dL (ref 30.0–36.0)
MCV: 87.6 fL (ref 80.0–100.0)
Platelets: 510 10*3/uL — ABNORMAL HIGH (ref 150–400)
RBC: 4.58 MIL/uL (ref 4.22–5.81)
RDW: 12.2 % (ref 11.5–15.5)
WBC: 10.7 10*3/uL — ABNORMAL HIGH (ref 4.0–10.5)
nRBC: 0 % (ref 0.0–0.2)

## 2020-06-29 LAB — CBG MONITORING, ED
Glucose-Capillary: 460 mg/dL — ABNORMAL HIGH (ref 70–99)
Glucose-Capillary: 325 mg/dL — ABNORMAL HIGH (ref 70–99)

## 2020-06-29 MED ORDER — SODIUM CHLORIDE 0.9 % IV BOLUS
1500.0000 mL | Freq: Once | INTRAVENOUS | Status: AC
  Administered 2020-06-29: 1500 mL via INTRAVENOUS

## 2020-06-29 NOTE — ED Provider Notes (Signed)
Emergency Medicine Provider Triage Evaluation Note  Lucas White , a 60 y.o. male  was evaluated in triage.  Pt complains of hyperglycemia. States he does not take his medicine like he should.  Review of Systems  Positive: hyperglycemia Negative: Fevers, vomiting  Physical Exam  BP 135/88   Pulse 86   Temp 98.6 F (37 C)   Resp 20   SpO2 98%  Gen:   Awake, no distress   Resp:  Normal effort  MSK:   Moves extremities without difficulty   Medical Decision Making  Medically screening exam initiated at 12:30 PM.  Appropriate orders placed.  Lucas White was informed that the remainder of the evaluation will be completed by another provider, this initial triage assessment does not replace that evaluation, and the importance of remaining in the ED until their evaluation is complete.     Rayne Du 06/29/20 1231    Gwyneth Sprout, MD 07/01/20 1233

## 2020-06-29 NOTE — Discharge Instructions (Addendum)
You were seen in the emergency department today for high blood sugar.  Your blood work does show high blood sugar, otherwise it was fairly reassuring.  Your platelets were elevated, have this rechecked by your primary care provider.  Please sure to take all your medications as prescribed.  Please be sure to check your blood sugar regularly.  Call make your primary care provider aware of your ER visit today.  Please follow-up within 1 week.  Return to the ER for any new or worsening symptoms including but not limited to fever, vomiting, abdominal pain, chest pain, trouble breathing, fever, or any other concerns.

## 2020-06-29 NOTE — ED Provider Notes (Signed)
[facility] EMERGENCY DEPARTMENT Provider Note   CSN: [id number] Arrival date & time: 06/29/20  1214     History Chief Complaint  Patient presents with   Hyperglycemia    Lucas White is a 60 y.o. male with a history of type 2 diabetes mellitus who presents to the emergency department for evaluation of hyperglycemia.  Patient states that his blood sugar was high and that his doctor told him to come be evaluated in the emergency department.  He states he has not taken his diabetes medications (metformin, glipizide, and insulin) for a while now.  He states he overall feels well, he has no specific complaints in terms of symptoms at this time, no alleviating or aggravating factors.  He denies fever, chills, nausea, vomiting, abdominal pain, dysuria, polyuria, polydipsia, chest pain, or dyspnea.  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   Psychosis (HCC) 05/29/2020   Auditory hallucinations 05/29/2020   Visual hallucinations 05/29/2020   Alcohol withdrawal (HCC) 05/28/2020   Alcohol withdrawal delirium (HCC) 05/28/2020   Hypokalemia 02/18/2020   Chronic pain syndrome 02/18/2020   Acute metabolic encephalopathy 02/18/2020   Volume depletion 12/19/2018   Confusion and disorientation 12/19/2018   Type 2 diabetes mellitus with hyperglycemia, with long-term current use of insulin (HCC)    Chronic back pain greater than 3 months duration    Obesity (BMI 30.0-34.9)     Past Surgical History:  Procedure Laterality Date   BACK SURGERY     COLONOSCOPY         Family History  Problem Relation Age of Onset   Diabetes Father     Social History   Tobacco Use    Smoking status: Never   Smokeless tobacco: Never  Vaping Use   Vaping Use: Never used  Substance Use Topics   Alcohol use: No   Drug use: Never    Home Medications Prior to Admission medications   Medication Sig Start Date End Date Taking? Authorizing Provider  cholecalciferol (VITAMIN D3) 25 MCG (1000 UNIT) tablet Take 1,000 Units by mouth daily.    [provider]  diltiazem (TIAZAC) 180 MG 24 hr capsule Take 180 mg by mouth daily.    [provider]  escitalopram (LEXAPRO) 20 MG tablet Take 20 mg by mouth daily.    [provider]  gabapentin (NEURONTIN) 300 MG capsule Take 600 mg by mouth 4 (four) times daily.    [provider]  glipiZIDE (GLUCOTROL) 10 MG tablet Take 10 mg by mouth daily before breakfast.    [provider]  glucose 4 GM chewable tablet Chew 16 tablets by mouth as needed for low blood sugar (BS less than 70).    [provider]  haloperidol (HALDOL) 2 MG tablet Take 1 tablet (2 mg total) by mouth 2 (two) times daily. 05/31/20 06/30/20  Hughie Closs, MD  insulin glargine (LANTUS) 100 UNIT/ML injection Inject 20 Units into the skin at bedtime.    [provider]  melatonin 3 MG TABS tablet Take 6 mg by mouth at bedtime.    [provider]  metFORMIN (GLUCOPHAGE) 500 MG tablet  Take 500 mg by mouth 2 (two) times daily with a meal.    [provider]  Oxycodone HCl 20 MG TABS Take 20 mg by mouth in the morning, at noon, and at bedtime.    [provider]  pravastatin (PRAVACHOL) 40 MG tablet Take 40 mg by mouth daily.    [provider]    Allergies    Patient has no known allergies.  Review of Systems   Review of Systems  Constitutional:  Negative for chills, fatigue and fever.  Respiratory:  Negative for cough and shortness of breath.   Cardiovascular:  Negative for chest pain.  Gastrointestinal:  Negative for abdominal pain, diarrhea, nausea and vomiting.   Endocrine: Negative for polydipsia and polyuria.  Genitourinary:  Negative for dysuria.  Neurological:  Negative for syncope.  All other systems reviewed and are negative.  Physical Exam Updated Vital Signs BP 135/88   Pulse 86   Temp 98.6 F (37 C)   Resp 20   SpO2 98%   Physical Exam Vitals and nursing note reviewed.  Constitutional:      General: He is not in acute distress.    Appearance: He is well-developed. He is not toxic-appearing.  HENT:     Head: Normocephalic and atraumatic.  Eyes:     General:        Right eye: No discharge.        Left eye: No discharge.     Conjunctiva/sclera: Conjunctivae normal.  Cardiovascular:     Rate and Rhythm: Normal rate and regular rhythm.  Pulmonary:     Effort: Pulmonary effort is normal. No respiratory distress.     Breath sounds: Normal breath sounds. No wheezing, rhonchi or rales.  Abdominal:     General: There is no distension.     Palpations: Abdomen is soft.     Tenderness: There is no abdominal tenderness.  Musculoskeletal:     Cervical back: Neck supple.  Skin:    General: Skin is warm and dry.     Findings: No rash.  Neurological:     Mental Status: He is alert.     Comments: Clear speech.   Psychiatric:        Behavior: Behavior normal.    ED Results / Procedures / Treatments   Labs (all labs ordered are listed, but only abnormal results are displayed) Labs Reviewed  BASIC METABOLIC PANEL - Abnormal; Notable for the following components:      Result Value   Sodium 133 (*)    Chloride 96 (*)    Glucose, Bld 457 (*)    BUN 5 (*)    All other components within normal limits  CBC - Abnormal; Notable for the following components:   WBC 10.7 (*)    Platelets 510 (*)    All other components within normal limits  CBG MONITORING, ED - Abnormal; Notable for the following components:   Glucose-Capillary 460 (*)    All other components within normal limits  URINALYSIS, ROUTINE W REFLEX MICROSCOPIC     EKG None  Radiology No results found.  Procedures Procedures   Medications Ordered in ED Medications  sodium chloride 0.9 % bolus 1,500 mL (1,500 mLs Intravenous New Bag/Given 06/29/20 1413)    ED Course  I have reviewed the triage vital signs and the nursing notes.  Pertinent labs & imaging results that were available during my care of the patient were reviewed by me and considered in my medical decision making (  see chart for details).    MDM Rules/Calculators/A&P                          Patient presents to the ED with complaints of hyperglycemia.  Patient is nontoxic, resting comfortably, vitals are within normal limits.  Patient has no further specific complaints.  His physical exam is benign.  Additional history obtained:  Additional history obtained from chart review & nursing note review.   Lab Tests:  I reviewed and interpreted labs, which included:  CBC: Mild leukocytosis and thrombocytosis, will need PCP recheck. BMP: Hyperglycemia without acidosis or anion gap elevation.  Mild hyponatremia/chloremia. UA: Glucosuria- no ketonuria/infection.   ED Course:  Patient with what appears to be fairly asymptomatic hyperglycemia, does not appear to be in DKA or HHS. Suspect hyperglycemia secondary to lack of med compliance- patient educated on this, we went over all of his medicine together and what the purpose of each is and the dosing- also did this with his wife via telephone who has ensured he has all of his medicines. His blood sugar is mildly improved S/p fluids to 325. Overall appears appropriate for discharge.   I discussed results, treatment plan, need for follow-up, and return precautions with the patient. Provided opportunity for questions, patient confirmed understanding and is in agreement with plan.   Portions of this note were generated with Scientist, clinical (histocompatibility and immunogenetics). Dictation errors may occur despite best attempts at proofreading.  Final Clinical  Impression(s) / ED Diagnoses Final diagnoses:  Hyperglycemia    Rx / DC Orders ED Discharge Orders     None        Desmond Lope 06/29/20 1520    Linwood Dibbles, MD 06/30/20 1425

## 2020-06-29 NOTE — ED Triage Notes (Signed)
CBG 460.  Pt here for hyperglycemia and states he hasn't taken insulin for a few days.  Denies being out of insulin.  Denies any other complaints.  Denies pain.  Denies nausea and vomiting.

## 2020-09-18 ENCOUNTER — Emergency Department (HOSPITAL_COMMUNITY)
Admission: EM | Admit: 2020-09-18 | Discharge: 2020-09-18 | Disposition: A | Discharge status: Home / Self Care | Payer: Medicare Other | Attending: Emergency Medicine | Admitting: Emergency Medicine

## 2020-09-18 ENCOUNTER — Encounter (HOSPITAL_COMMUNITY): Payer: Self-pay | Admitting: Emergency Medicine

## 2020-09-18 ENCOUNTER — Other Ambulatory Visit: Payer: Self-pay

## 2020-09-18 DIAGNOSIS — E119 Type 2 diabetes mellitus without complications: Secondary | ICD-10-CM | POA: Diagnosis not present

## 2020-09-18 DIAGNOSIS — R208 Other disturbances of skin sensation: Secondary | ICD-10-CM | POA: Diagnosis not present

## 2020-09-18 DIAGNOSIS — G8929 Other chronic pain: Secondary | ICD-10-CM | POA: Insufficient documentation

## 2020-09-18 DIAGNOSIS — Z7984 Long term (current) use of oral hypoglycemic drugs: Secondary | ICD-10-CM | POA: Diagnosis not present

## 2020-09-18 DIAGNOSIS — Z8616 Personal history of COVID-19: Secondary | ICD-10-CM | POA: Insufficient documentation

## 2020-09-18 DIAGNOSIS — Z794 Long term (current) use of insulin: Secondary | ICD-10-CM | POA: Diagnosis not present

## 2020-09-18 DIAGNOSIS — M545 Low back pain, unspecified: Secondary | ICD-10-CM | POA: Diagnosis not present

## 2020-09-18 LAB — I-STAT CHEM 8, ED
BUN: 7 mg/dL (ref 6–20)
Calcium, Ion: 1.14 mmol/L — ABNORMAL LOW (ref 1.15–1.40)
Chloride: 101 mmol/L (ref 98–111)
Creatinine, Ser: 0.7 mg/dL (ref 0.61–1.24)
Glucose, Bld: 240 mg/dL — ABNORMAL HIGH (ref 70–99)
HCT: 39 % (ref 39.0–52.0)
Hemoglobin: 13.3 g/dL (ref 13.0–17.0)
Potassium: 3.4 mmol/L — ABNORMAL LOW (ref 3.5–5.1)
Sodium: 139 mmol/L (ref 135–145)
TCO2: 27 mmol/L (ref 22–32)

## 2020-09-18 MED ORDER — KETOROLAC TROMETHAMINE 30 MG/ML IJ SOLN
30.0000 mg | Freq: Once | INTRAMUSCULAR | Status: AC
  Administered 2020-09-18: 30 mg via INTRAVENOUS
  Filled 2020-09-18: qty 1

## 2020-09-18 MED ORDER — HYDROMORPHONE HCL 1 MG/ML IJ SOLN
1.0000 mg | Freq: Once | INTRAMUSCULAR | Status: AC
  Administered 2020-09-18: 1 mg via INTRAMUSCULAR
  Filled 2020-09-18: qty 1

## 2020-09-18 MED ORDER — ETODOLAC 300 MG PO CAPS: 300.0000 mg | ORAL_CAPSULE | Freq: Three times a day (TID) | ORAL | 0 refills | Status: DC

## 2020-09-18 NOTE — ED Notes (Signed)
Pt expresses desire to leave. Refusing IV and medication. Dr. Lynelle Doctor notified. Pt speaks to Dr. Lynelle Doctor and agrees to IV and meds. Pt is sitting on edge of bed and seems calm when this RN returns. States pain 10/10 "all the time". IV placed and meds given.

## 2020-09-18 NOTE — ED Triage Notes (Signed)
Pt to triage via [facility].  Reports chronic low back pain and neuropathy pain that is worse x 2 days.  Out of oxycodone x 1 week.  CBG 259.

## 2020-09-18 NOTE — ED Provider Notes (Signed)
[facility] EMERGENCY DEPARTMENT Provider Note   CSN: [id number] Arrival date & time: 09/18/20  0900     History Chief Complaint  Patient presents with   Back Pain    Lucas White is a 60 y.o. male.   Back Pain  Patient presents to the ED with complaints of back pain and burning in his extremities.  Patient states he has a history of chronic back and neck pain.  He regularly takes oxycodone for his pain.  Patient states he has been out of his oxycodone for the last week.  He is having severe pain in his lower back.  Also goes up into his neck.  He has burning sensation in his hands and feet.  Patient denies any fevers or chills.  No vomiting.  No diarrhea.  No new numbness or weakness.  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   Psychosis (HCC) 05/29/2020   Auditory hallucinations 05/29/2020   Visual hallucinations 05/29/2020   Alcohol withdrawal (HCC) 05/28/2020   Alcohol withdrawal delirium (HCC) 05/28/2020   Hypokalemia 02/18/2020   Chronic pain syndrome 02/18/2020   Acute metabolic encephalopathy 02/18/2020   Volume depletion 12/19/2018   Confusion and disorientation 12/19/2018   Type 2 diabetes mellitus with hyperglycemia, with long-term current use of insulin (HCC)    Chronic back pain greater than 3 months duration    Obesity (BMI 30.0-34.9)     Past Surgical History:  Procedure Laterality Date   BACK SURGERY     COLONOSCOPY         Family History  Problem Relation Age of Onset   Diabetes Father     Social History   Tobacco Use   Smoking status: Never   Smokeless tobacco: Never  Vaping Use   Vaping Use: Never used  Substance Use Topics    Alcohol use: No   Drug use: Never    Home Medications Prior to Admission medications   Medication Sig Start Date End Date Taking? Authorizing Provider  etodolac (LODINE) 300 MG capsule Take 1 capsule (300 mg total) by mouth every 8 (eight) hours. 09/18/20  Yes Linwood Dibbles, MD  cholecalciferol (VITAMIN D3) 25 MCG (1000 UNIT) tablet Take 1,000 Units by mouth daily.    [provider]  diltiazem (TIAZAC) 180 MG 24 hr capsule Take 180 mg by mouth daily.    [provider]  escitalopram (LEXAPRO) 20 MG tablet Take 20 mg by mouth daily.    [provider]  gabapentin (NEURONTIN) 300 MG capsule Take 600 mg by mouth 4 (four) times daily.    [provider]  glipiZIDE (GLUCOTROL) 10 MG tablet Take 10 mg by mouth daily before breakfast.    [provider]  glucose 4 GM chewable tablet Chew 16 tablets by mouth as needed for low blood sugar (BS less than 70).    [provider]  haloperidol (HALDOL) 2 MG tablet Take 1 tablet (2 mg total) by mouth 2 (two) times daily. 05/31/20 06/30/20  Hughie Closs, MD  insulin glargine (LANTUS) 100 UNIT/ML injection Inject 20 Units into the skin at bedtime.    [provider]  melatonin 3  MG TABS tablet Take 6 mg by mouth at bedtime.    [provider]  metFORMIN (GLUCOPHAGE) 500 MG tablet Take 500 mg by mouth 2 (two) times daily with a meal.    [provider]  Oxycodone HCl 20 MG TABS Take 20 mg by mouth in the morning, at noon, and at bedtime.    [provider]  pravastatin (PRAVACHOL) 40 MG tablet Take 40 mg by mouth daily.    [provider]    Allergies    Patient has no known allergies.  Review of Systems   Review of Systems  Musculoskeletal:  Positive for back pain.  All other systems reviewed and are negative.  Physical Exam Updated Vital Signs BP 119/72   Pulse 96   Temp 99 F (37.2 C) (Oral)   Resp 16   SpO2 100%   Physical Exam Vitals and nursing  note reviewed.  Constitutional:      Appearance: He is well-developed.     Comments: Appears to be in pain  HENT:     Head: Normocephalic and atraumatic.     Right Ear: External ear normal.     Left Ear: External ear normal.  Eyes:     General: No scleral icterus.       Right eye: No discharge.        Left eye: No discharge.     Conjunctiva/sclera: Conjunctivae normal.  Neck:     Trachea: No tracheal deviation.  Cardiovascular:     Rate and Rhythm: Normal rate and regular rhythm.  Pulmonary:     Effort: Pulmonary effort is normal. No respiratory distress.     Breath sounds: Normal breath sounds. No stridor. No wheezing or rales.  Abdominal:     General: Bowel sounds are normal. There is no distension.     Palpations: Abdomen is soft.     Tenderness: There is no abdominal tenderness. There is no guarding or rebound.  Musculoskeletal:        General: No tenderness or deformity.     Cervical back: Neck supple.  Skin:    General: Skin is warm and dry.     Findings: No rash.  Neurological:     General: No focal deficit present.     Mental Status: He is alert.     Cranial Nerves: No cranial nerve deficit (no facial droop, extraocular movements intact, no slurred speech).     Sensory: No sensory deficit.     Motor: No weakness, abnormal muscle tone or seizure activity.     Coordination: Coordination normal.  Psychiatric:        Mood and Affect: Mood normal.    ED Results / Procedures / Treatments   Labs (all labs ordered are listed, but only abnormal results are displayed) Labs Reviewed  I-STAT CHEM 8, ED - Abnormal; Notable for the following components:      Result Value   Potassium 3.4 (*)    Glucose, Bld 240 (*)    Calcium, Ion 1.14 (*)    All other components within normal limits    EKG None  Radiology No results found.  Procedures Procedures   Medications Ordered in ED Medications  HYDROmorphone (DILAUDID) injection 1 mg (has no administration in time  range)  ketorolac (TORADOL) 30 MG/ML injection 30 mg (30 mg Intravenous Given 09/18/20 1049)    ED Course  I have reviewed the triage vital signs and the nursing notes.  Pertinent labs & imaging results that  were available during my care of the patient were reviewed by me and considered in my medical decision making (see chart for details).  Clinical Course as of 09/18/20 1146  Sat Sep 18, 2020  0920 90 oxycodone tablets on 9/6 [JK]  4709 Pt denies that he received the prescription.  States he needs to be admitted [JK]  1123 Labs notable for hyperglycemia but no signs of acidosis or other acute abnormalities [JK]    Clinical Course User Index [JK] Linwood Dibbles, MD   MDM Rules/Calculators/A&P                          Patient presents with an exacerbation of his chronic pain.  [facility] [place] PDMP reviewed.  Patient was given 90 oxycodone tablets on September 6.  Patient denies receiving this prescription.  I showed him the I offered nonnarcotic pain medication.  Patient declines.  He states that will not work.  He is also worried about his blood sugar.  We will check i-STAT Chem-8. Patient's metabolic panel does not show any acute electrolyte normalities.  Patient is hyperglycemic but no signs of DKA.  Patient was given a dose of pain medications.  Will discharge home with a prescription for nonopiate pain medications.  Explained to patient that he will need to talk to his doctor regarding refills of his opiates. Final Clinical Impression(s) / ED Diagnoses Final diagnoses:  Chronic low back pain, unspecified back pain laterality, unspecified whether sciatica present    Rx / DC Orders ED Discharge Orders          Ordered    etodolac (LODINE) 300 MG capsule  Every 8 hours       Note to Pharmacy: As needed for pain   09/18/20 1144             Linwood Dibbles, MD 09/18/20 1146

## 2020-09-18 NOTE — Discharge Instructions (Signed)
Take medication as needed for pain.  Follow-up with your doctor regarding your oxycodone prescriptions.

## 2020-10-19 ENCOUNTER — Emergency Department (HOSPITAL_COMMUNITY)
Admission: EM | Admit: 2020-10-19 | Discharge: 2020-10-19 | Disposition: A | Discharge status: Home / Self Care | Payer: Medicare Other | Attending: Emergency Medicine | Admitting: Emergency Medicine

## 2020-10-19 ENCOUNTER — Encounter (HOSPITAL_COMMUNITY): Payer: Self-pay

## 2020-10-19 DIAGNOSIS — Z7984 Long term (current) use of oral hypoglycemic drugs: Secondary | ICD-10-CM | POA: Insufficient documentation

## 2020-10-19 DIAGNOSIS — Z794 Long term (current) use of insulin: Secondary | ICD-10-CM | POA: Diagnosis not present

## 2020-10-19 DIAGNOSIS — E876 Hypokalemia: Secondary | ICD-10-CM | POA: Insufficient documentation

## 2020-10-19 DIAGNOSIS — E1165 Type 2 diabetes mellitus with hyperglycemia: Secondary | ICD-10-CM | POA: Diagnosis not present

## 2020-10-19 DIAGNOSIS — R739 Hyperglycemia, unspecified: Secondary | ICD-10-CM

## 2020-10-19 LAB — BASIC METABOLIC PANEL
Anion gap: 14 (ref 5–15)
BUN: 12 mg/dL (ref 6–20)
CO2: 22 mmol/L (ref 22–32)
Calcium: 9.4 mg/dL (ref 8.9–10.3)
Chloride: 92 mmol/L — ABNORMAL LOW (ref 98–111)
Creatinine, Ser: 1.09 mg/dL (ref 0.61–1.24)
GFR, Estimated: >60 mL/min (ref >60)
Glucose, Bld: 529 mg/dL (ref 70–99)
Potassium: 3.2 mmol/L — ABNORMAL LOW (ref 3.5–5.1)
Sodium: 128 mmol/L — ABNORMAL LOW (ref 135–145)

## 2020-10-19 LAB — CBC WITH DIFFERENTIAL/PLATELET
Abs Immature Granulocytes: 0.03 10*3/uL (ref 0.00–0.07)
Basophils Absolute: 0 10*3/uL (ref 0.0–0.1)
Basophils Relative: 0 %
Eosinophils Absolute: 0.1 10*3/uL (ref 0.0–0.5)
Eosinophils Relative: 1 %
HCT: 38.3 % — ABNORMAL LOW (ref 39.0–52.0)
Hemoglobin: 13.6 g/dL (ref 13.0–17.0)
Immature Granulocytes: 0 %
Lymphocytes Relative: 39 %
Lymphs Abs: 3.8 10*3/uL (ref 0.7–4.0)
MCH: 29.1 pg (ref 26.0–34.0)
MCHC: 35.5 g/dL (ref 30.0–36.0)
MCV: 81.8 fL (ref 80.0–100.0)
Monocytes Absolute: 0.3 10*3/uL (ref 0.1–1.0)
Monocytes Relative: 3 %
Neutro Abs: 5.4 10*3/uL (ref 1.7–7.7)
Neutrophils Relative %: 57 %
Platelets: 470 10*3/uL — ABNORMAL HIGH (ref 150–400)
RBC: 4.68 MIL/uL (ref 4.22–5.81)
RDW: 12.2 % (ref 11.5–15.5)
WBC: 9.6 10*3/uL (ref 4.0–10.5)
nRBC: 0 % (ref 0.0–0.2)

## 2020-10-19 LAB — I-STAT CHEM 8, ED
BUN: 11 mg/dL (ref 6–20)
Calcium, Ion: 1.15 mmol/L (ref 1.15–1.40)
Chloride: 92 mmol/L — ABNORMAL LOW (ref 98–111)
Creatinine, Ser: 0.9 mg/dL (ref 0.61–1.24)
Glucose, Bld: 535 mg/dL (ref 70–99)
HCT: 43 % (ref 39.0–52.0)
Hemoglobin: 14.6 g/dL (ref 13.0–17.0)
Potassium: 3.1 mmol/L — ABNORMAL LOW (ref 3.5–5.1)
Sodium: 131 mmol/L — ABNORMAL LOW (ref 135–145)
TCO2: 21 mmol/L — ABNORMAL LOW (ref 22–32)

## 2020-10-19 LAB — URINALYSIS, ROUTINE W REFLEX MICROSCOPIC
Bacteria, UA: NONE SEEN
Bilirubin Urine: NEGATIVE
Glucose, UA: >=500 mg/dL — AB
Hgb urine dipstick: NEGATIVE
Ketones, ur: 5 mg/dL — AB
Leukocytes,Ua: NEGATIVE
Nitrite: NEGATIVE
Protein, ur: NEGATIVE mg/dL
Specific Gravity, Urine: 1.017 (ref 1.005–1.030)
pH: 9 — ABNORMAL HIGH (ref 5.0–8.0)

## 2020-10-19 LAB — CBG MONITORING, ED
Glucose-Capillary: 554 mg/dL (ref 70–99)
Glucose-Capillary: 376 mg/dL — ABNORMAL HIGH (ref 70–99)
Glucose-Capillary: 339 mg/dL — ABNORMAL HIGH (ref 70–99)

## 2020-10-19 LAB — BETA-HYDROXYBUTYRIC ACID: Beta-Hydroxybutyric Acid: 0.58 mmol/L — ABNORMAL HIGH (ref 0.05–0.27)

## 2020-10-19 MED ORDER — POTASSIUM CHLORIDE 10 MEQ/100ML IV SOLN
10.0000 meq | INTRAVENOUS | Status: DC
Start: 2020-10-19 — End: 2020-10-19

## 2020-10-19 MED ORDER — POTASSIUM CHLORIDE 20 MEQ PO PACK
20.0000 meq | PACK | Freq: Two times a day (BID) | ORAL | Status: DC
  Administered 2020-10-19: 20 meq via ORAL
  Filled 2020-10-19: qty 1

## 2020-10-19 MED ORDER — LACTATED RINGERS IV BOLUS
2000.0000 mL | Freq: Once | INTRAVENOUS | Status: AC
  Administered 2020-10-19: 2000 mL via INTRAVENOUS

## 2020-10-19 MED ORDER — INSULIN ASPART 100 UNIT/ML IJ SOLN
15.0000 [IU] | Freq: Once | INTRAMUSCULAR | Status: AC
  Administered 2020-10-19: 15 [IU] via SUBCUTANEOUS
  Filled 2020-10-19: qty 0.15

## 2020-10-19 MED ORDER — ACETAMINOPHEN 500 MG PO TABS
1000.0000 mg | ORAL_TABLET | Freq: Once | ORAL | Status: AC
  Administered 2020-10-19: 1000 mg via ORAL
  Filled 2020-10-19: qty 2

## 2020-10-19 MED ORDER — INSULIN STARTER KIT- PEN NEEDLES (ENGLISH)
1.0000 | Freq: Once | Status: DC
  Filled 2020-10-19: qty 1

## 2020-10-19 MED ORDER — OXYCODONE HCL 5 MG PO TABS
5.0000 mg | ORAL_TABLET | Freq: Once | ORAL | Status: AC
  Administered 2020-10-19: 5 mg via ORAL
  Filled 2020-10-19: qty 1

## 2020-10-19 NOTE — Discharge Instructions (Addendum)
Lucas White, Your blood sugar was very high when you presented to the ED.  This makes you dehydrated. We gave you fluids and insulin for this and your blood sugar decreased.  Please follow-up with your primary care provider at the [facility] for your diabetes.  Uncontrolled blood sugar is making the burning in your feet worse, please follow-up with your primary care provider to have needles refilled.  You have chronic pain, please follow-up with your primary care physician for this. Hope you feel better soon, Rudene Christians, DO

## 2020-10-19 NOTE — ED Notes (Signed)
Patients sheets, and gown were changed. A Primo Fit was put on. He had a urinal bedside however he did not use it so his bed was wet.

## 2020-10-19 NOTE — ED Triage Notes (Signed)
Pt comes from home with a high blood sugar and non compliant with his meds Also he's been on an opioid for chronic back pain and has been recently taken off them

## 2020-10-19 NOTE — ED Provider Notes (Signed)
[facility]-EMERGENCY DEPT Provider Note   CSN: [id number] Arrival date & time: 10/19/20  8563     History Chief Complaint  Patient presents with   Hyperglycemia    Lucas White is a 60 y.o. male.  Patient presents to ED with complaints of back pain and burning in his extremities. On presentation, his blood glucose was in the 500s.  He states that he previously has been hospitalized for his blood sugar.  He is prescribed medications for diabetes, but he is unsure of what they are and is non-adherent with taking them.  On chart review, he is prescribed metformin and insulin.  His main complaints are the burning in his extremities and the pain in his back.     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   Psychosis (HCC) 05/29/2020   Auditory hallucinations 05/29/2020   Visual hallucinations 05/29/2020   Alcohol withdrawal (HCC) 05/28/2020   Alcohol withdrawal delirium (HCC) 05/28/2020   Hypokalemia 02/18/2020   Chronic pain syndrome 02/18/2020   Acute metabolic encephalopathy 02/18/2020   Volume depletion 12/19/2018   Confusion and disorientation 12/19/2018   Type 2 diabetes mellitus with hyperglycemia, with long-term current use of insulin (HCC)    Chronic back pain greater than 3 months duration    Obesity (BMI 30.0-34.9)     Past Surgical History:  Procedure Laterality Date   BACK SURGERY     COLONOSCOPY         Family History  Problem Relation Age of Onset   Diabetes Father     Social History   Tobacco Use   Smoking status: Never    Smokeless tobacco: Never  Vaping Use   Vaping Use: Never used  Substance Use Topics   Alcohol use: No   Drug use: Never    Home Medications Prior to Admission medications   Medication Sig Start Date End Date Taking? Authorizing Provider  cholecalciferol (VITAMIN D3) 25 MCG (1000 UNIT) tablet Take 1,000 Units by mouth daily.    [provider]  diltiazem (TIAZAC) 180 MG 24 hr capsule Take 180 mg by mouth daily.    [provider]  escitalopram (LEXAPRO) 20 MG tablet Take 20 mg by mouth daily.    [provider]  etodolac (LODINE) 300 MG capsule Take 1 capsule (300 mg total) by mouth every 8 (eight) hours. 09/18/20   Linwood Dibbles, MD  gabapentin (NEURONTIN) 300 MG capsule Take 600 mg by mouth 4 (four) times daily.    [provider]  glipiZIDE (GLUCOTROL) 10 MG tablet Take 10 mg by mouth daily before breakfast.    [provider]  glucose 4 GM chewable tablet Chew 16 tablets by mouth as needed for low blood sugar (BS less than 70).    [provider]  haloperidol (HALDOL) 2 MG tablet Take 1 tablet (2 mg total) by mouth 2 (two) times daily. 05/31/20 06/30/20  Hughie Closs, MD  insulin glargine (LANTUS) 100 UNIT/ML injection Inject 20 Units into the skin at bedtime.  [provider]  melatonin 3 MG TABS tablet Take 6 mg by mouth at bedtime.    [provider]  metFORMIN (GLUCOPHAGE) 500 MG tablet Take 500 mg by mouth 2 (two) times daily with a meal.    [provider]  Oxycodone HCl 20 MG TABS Take 20 mg by mouth in the morning, at noon, and at bedtime.    [provider]  pravastatin (PRAVACHOL) 40 MG tablet Take 40 mg by mouth daily.    [provider]    Allergies    Patient has no known allergies.  Review of Systems   Review of Systems  Constitutional:  Positive for activity change.  HENT: Negative.    Eyes: Negative.   Respiratory:  Positive for shortness of breath.   Cardiovascular:  Negative.   Gastrointestinal:  Positive for nausea. Negative for vomiting.  Genitourinary:  Positive for frequency.  Musculoskeletal:        Burning in feet bilaterally  Skin: Negative.   Neurological:  Positive for headaches.   Physical Exam Updated Vital Signs BP (!) 150/93 (BP Location: Right Arm)   Pulse 69   Temp 97.9 F (36.6 C) (Oral)   Resp (!) 27   SpO2 100%   Physical Exam Constitutional:      General: He is not in acute distress. HENT:     Head: Normocephalic and atraumatic.     Mouth/Throat:     Mouth: Mucous membranes are dry.  Musculoskeletal:     Cervical back: Normal range of motion.  Neurological:     Mental Status: He is alert.    ED Results / Procedures / Treatments   Labs (all labs ordered are listed, but only abnormal results are displayed) Labs Reviewed  CBG MONITORING, ED - Abnormal; Notable for the following components:      Result Value   Glucose-Capillary 554 (*)    All other components within normal limits  CBC WITH DIFFERENTIAL/PLATELET  URINALYSIS, ROUTINE W REFLEX MICROSCOPIC  BETA-HYDROXYBUTYRIC ACID  BASIC METABOLIC PANEL  I-STAT CHEM 8, ED    EKG None  Radiology No results found.  Medications Ordered in ED Medications  lactated ringers bolus 2,000 mL (has no administration in time range)    ED Course  I have reviewed the triage vital signs and the nursing notes.  Pertinent labs & imaging results that were available during my care of the patient were reviewed by me and considered in my medical decision making (see chart for details).  Clinical Course as of 10/19/20 0757  Tue Oct 19, 2020  0745 I-stat chem 8, ED (not at [facility] or [facility])(!!) [KM]    Clinical Course User Index [KM] Haldon Carley, Florentina Addison, DO   MDM Rules/Calculators/A&P                          Patient presented due to burning pain in his extremities and back pain.  He was found to have hyperglycemia with blood glucose of 554. BMP showed Na 128, K 3.2, Bicarb of  22, Anion gap of 14, and Glucose of 529.   Beta-Hydroxybutyric acid mildly elevated at 0.58. UA with glucosuria and ketones.  Patient is hyperglycemic but no signs of DKA.   He was treated with PO potassium 40 Meq, 2 units of LR and Short-acting insulin 15 Units. Given Roxicodone 5 mg due to pain.  Repeat CBG with blood glucose of 339.  Will discharge home with instructions to follow-up  with primary care physician within one week and to return to ED if he began to feel worse.    Final Clinical Impression(s) / ED Diagnoses Final diagnoses:  None    Rx / DC Orders ED Discharge Orders     None        Azura Tufaro, Florentina Addison, DO 10/19/20 1056    Melene Plan, DO 10/19/20 1138

## 2020-11-09 ENCOUNTER — Other Ambulatory Visit: Payer: Self-pay | Admitting: Adult Medicine

## 2020-11-09 DIAGNOSIS — G8929 Other chronic pain: Secondary | ICD-10-CM

## 2020-11-30 ENCOUNTER — Ambulatory Visit: Payer: Medicaid Other | Admitting: Internal Medicine

## 2020-12-13 ENCOUNTER — Ambulatory Visit (HOSPITAL_COMMUNITY)
Admission: EM | Admit: 2020-12-13 | Discharge: 2020-12-13 | Disposition: A | Discharge status: Home / Self Care | Payer: Medicare Other | Attending: Psychiatry | Admitting: Psychiatry

## 2020-12-13 DIAGNOSIS — F1090 Alcohol use, unspecified, uncomplicated: Secondary | ICD-10-CM | POA: Diagnosis not present

## 2020-12-13 DIAGNOSIS — Z6379 Other stressful life events affecting family and household: Secondary | ICD-10-CM | POA: Insufficient documentation

## 2020-12-13 DIAGNOSIS — R6889 Other general symptoms and signs: Secondary | ICD-10-CM

## 2020-12-13 DIAGNOSIS — G9341 Metabolic encephalopathy: Secondary | ICD-10-CM | POA: Diagnosis not present

## 2020-12-13 DIAGNOSIS — F411 Generalized anxiety disorder: Secondary | ICD-10-CM | POA: Diagnosis present

## 2020-12-13 DIAGNOSIS — R410 Disorientation, unspecified: Secondary | ICD-10-CM | POA: Insufficient documentation

## 2020-12-13 DIAGNOSIS — F1111 Opioid abuse, in remission: Secondary | ICD-10-CM | POA: Diagnosis not present

## 2020-12-13 DIAGNOSIS — F1491 Cocaine use, unspecified, in remission: Secondary | ICD-10-CM | POA: Diagnosis not present

## 2020-12-13 DIAGNOSIS — R45851 Suicidal ideations: Secondary | ICD-10-CM | POA: Insufficient documentation

## 2020-12-13 DIAGNOSIS — R450 Nervousness: Secondary | ICD-10-CM | POA: Insufficient documentation

## 2020-12-13 DIAGNOSIS — F22 Delusional disorders: Secondary | ICD-10-CM | POA: Diagnosis not present

## 2020-12-13 NOTE — ED Provider Notes (Signed)
Behavioral Health Urgent Care Medical Screening Exam  Patient Name: Lucas White MRN: [id number] Date of Evaluation: 12/13/20 Chief Complaint:   Diagnosis:  Final diagnoses:  Forgetfulness  Generalized anxiety disorder    History of Present illness: Lucas White is a 60 y.o. male patient presented to [facility] as a walk in accompanied by his spouse and mother with complaints of "I am forgetting things".  Lucas White, 60 y.o., male patient seen face to face by this provider, consulted with Dr. Serafina Mitchell; and chart reviewed on 12/13/20.  Per chart review patient has a history of hospitalizations where he presents with altered mental status, confusion, and agitation.  He has been diagnosed with acute metabolic encephalopathy on his last admission.   Patient has not followed up with any of the resources that were provided to him.  He was instructed to follow up with neurology and outpatient psychiatric resources. 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  Reports he does not take any medications at this time.  Denies any recent falls or accidents.  Denies any history of seizures.   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 Patient has a history of cocaine use. States he has been free of substances for 1.5 months. He endorses occasional alcohol use. Spouse is present and talks often for him.  He looks to her to answer most questions.  Reports over the past few months patient has been more forgetful and paranoid.  Reports patient at times has been confused and gets agitated easily.  Reports patient threatened to kill himself over the weekend.  Patient denies, states "I do not think I said that".  Reports he got into an argument with his sons.  Reports one son walked away the other son continue to argue.  States he briefly had a thought of wanting to hurt his  son, but states "I would never do that". States they were referred to [facility] for medication management and to begin therapy.  Explained services that are provided at the [facility].  Patient is not eligible for outpatient services on the second floor due to his insurance.  During evaluation Lucas White is in sitting position in no acute distress.  He makes fair eye contact.  His speech is normal rate and tone, he is slow to respond at times he looks to his wife to answer questions.  He  is alert/oriented x 4 and cooperative. He is able to recall three words given to him to remember.  He has a dysphoric mood with a congruent affect.  He denies depression states he is more anxious than anything.  Reports when he is left alone he becomes very "panicky". His thought process is coherent and relevant; There is no indication that he is currently responding to internal/external stimuli. He endorses paranoia.  Spouse states patient is always looking around the complex where they live thinking that people are going to break in. He denies HI/SI/AVH.  Patient contracts for safety.  Denies access to firearms/weapons.  Spouse reports no immediate safety concerns with patient returning home.  Educated patient and his spouse on acute metabolic encephalopathy.  Instructed that if patient's condition were to worsen to call 911 or present to the nearest emergency room.  At this time Lucas White is educated and verbalizes understanding of mental health resources  and other crisis services in the community.  He is instructed to call 911 and present to the nearest emergency room should he experience any suicidal/homicidal ideation, auditory/visual/hallucinations, or detrimental worsening of he is mental health condition.  He was a also advised by Clinical research associate that he could call the toll-free phone on insurance card to assist with identifying in network counselors.  Psychiatric Specialty Exam  Presentation  General  Appearance:Appropriate for Environment; Casual  Eye Contact:Good  Speech:Clear and Coherent; Normal Rate  Speech Volume:Normal  Handedness:Right   Mood and Affect  Mood:Anxious  Affect:Appropriate; Congruent   Thought Process  Thought Processes:Coherent  Descriptions of Associations:Intact  Orientation:Full (Time, Place and Person)  Thought Content:Logical    Hallucinations:None  Ideas of Reference:None  Suicidal Thoughts:No  Homicidal Thoughts:No   Sensorium  Memory:Immediate Fair; Recent Fair; Remote Poor  Judgment:Fair  Insight:Fair   Executive Functions  Concentration:Fair  Attention Span:Fair  Recall:Fair  Fund of Knowledge:Fair  Language:Good   Psychomotor Activity  Psychomotor Activity:Normal   Assets  Assets:Communication Skills; Desire for Improvement; Financial Resources/Insurance; Housing; Leisure Time; Physical Health; Resilience; Social Support   Sleep  Sleep:Fair  Number of hours: No data recorded  No data recorded  Physical Exam: Physical Exam Vitals and nursing note reviewed.  Constitutional:      General: He is not in acute distress.    Appearance: He is well-developed. He is not ill-appearing.  HENT:     Head: Normocephalic and atraumatic.  Eyes:     General:        Right eye: No discharge.        Left eye: No discharge.     Conjunctiva/sclera: Conjunctivae normal.  Cardiovascular:     Rate and Rhythm: Normal rate.  Pulmonary:     Effort: Pulmonary effort is normal. No respiratory distress.  Musculoskeletal:        General: Normal range of motion.     Cervical back: Normal range of motion.  Skin:    General: Skin is warm and dry.     Capillary Refill: Capillary refill takes less than 2 seconds.  Neurological:     Mental Status: He is alert and oriented to person, place, and time.  Psychiatric:        Attention and Perception: Attention and perception normal.        Mood and Affect: Mood is anxious.         Speech: Speech normal.        Behavior: Behavior normal. Behavior is cooperative.        Thought Content: Thought content normal.        Cognition and Memory: Cognition normal.        Judgment: Judgment normal.   Review of Systems  Constitutional: Negative.   HENT: Negative.    Eyes: Negative.   Respiratory: Negative.  Negative for cough.   Cardiovascular: Negative.   Musculoskeletal: Negative.   Skin: Negative.   Neurological: Negative.   Endo/Heme/Allergies: Negative.   Psychiatric/Behavioral:  The patient is nervous/anxious.   Blood pressure 123/83, pulse 80, temperature 98.4 F (36.9 C), temperature source Oral, resp. rate 18, SpO2 99 %. There is no height or weight on file to calculate BMI.  Musculoskeletal: Strength & Muscle Tone: within normal limits Gait & Station: normal Patient leans: N/A   [facility] MSE Discharge Disposition for Follow up and Recommendations: Based on my evaluation the patient does not appear to have an emergency medical condition and can be discharged with resources and follow  up care in outpatient services for Medication Management and Individual Therapy  Discharge patient.  Patient does not meet criteria for inpatient psychiatric admission.  Provided resources for [facility] neurology.  Encouraged patient and spouse to call patient's PCP to rule out any medical concerns.  Provided resources for outpatient psychiatric services for medication management and therapy:[facility], [facility], [facility], [facility], [facility], [facility].    Revonda Humphrey, NP 12/13/2020, 9:23 PM

## 2020-12-13 NOTE — ED Notes (Signed)
Patient discharged by provider, given AVS with follow up instructions.

## 2020-12-13 NOTE — Discharge Instructions (Addendum)
Please contact one of the following facilities to start medication management and therapy services:  ? ?[facility] ?[address]  ?[place], [place] [address] ?[phone]  ? ?[facility]  ?[address] ?[place], [place] [address] ?[phone] ? ?[facility]  ?[address], [place], [place] [address] ?[phone] ? ?[facility]  ?[address]  ?[place], [place] [address] ?[phone] ? ?[facility]  ?[address] ?[place], [place] [address] ?[phone] ? ?[facility]  ?[address],  ?[place], [place] [address] ?[phone]  ?

## 2020-12-13 NOTE — BH Assessment (Addendum)
Pt referred here by [facility] for mental health services. Pt here with his wife reporting that over the past few months pt has been more forgetful, confused, paranoid, angry and wanting to fight. Wife reports that this weekend pt reported that he wanted to kill himself. Pt denies SI, HI, AVH today and does not recall making said statements this weekend. Pt reports having thoughts about wanting to hurt his 58 year old son but denies plan and intent. Pt receiving methadone treatment at [facility].  Pt is routine.

## 2020-12-21 ENCOUNTER — Telehealth (HOSPITAL_COMMUNITY): Payer: Self-pay

## 2020-12-21 NOTE — BH Assessment (Signed)
Care Management - Follow Up [facility] Discharges   Writer made contact with the patient and his wife.  His wife reports that they have not scheduled an appointment for the patient.  Writer informed the patient that he is able to follow up at [facility] on the 2nd floor

## 2021-01-05 IMAGING — DX DG CHEST 1V PORT
1 series · 1 of 1 positions shown · non-contrast
Comparison: 08/03/2010

CLINICAL DATA: Altered mental status.

EXAM:
PORTABLE CHEST 1 VIEW

[chest ap]
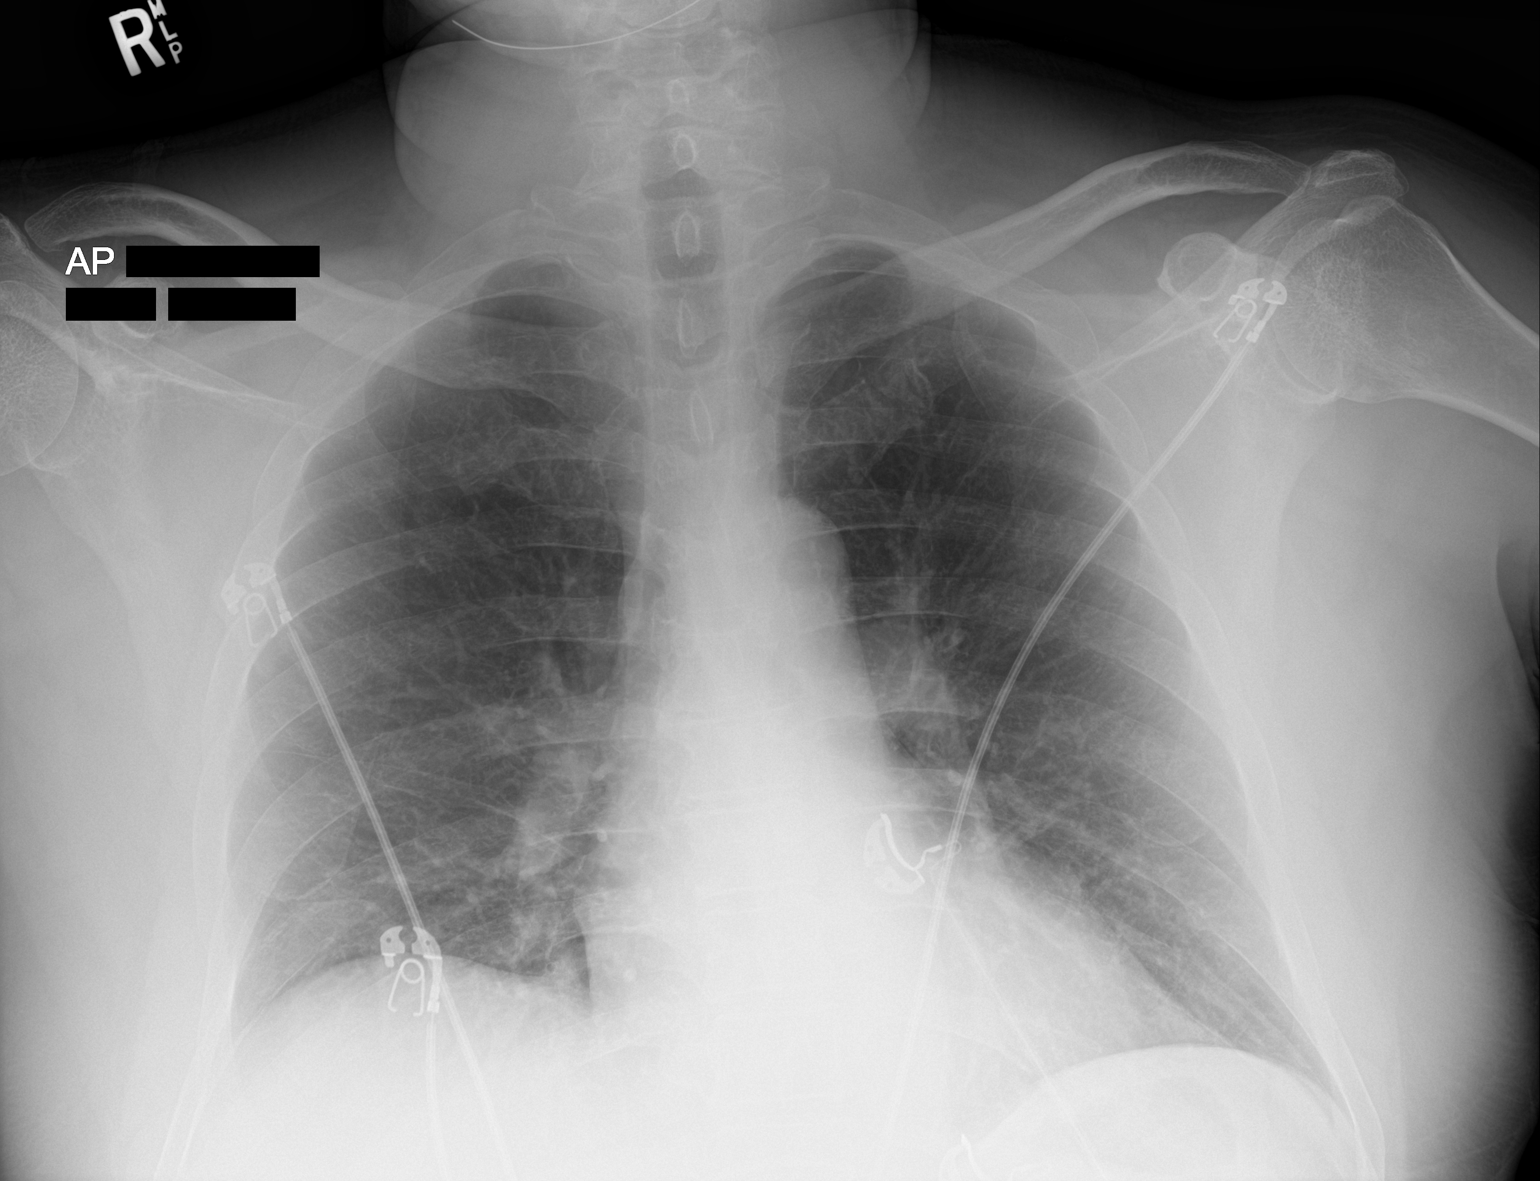

[1 of 1 positions shown; findings below may reference images not displayed]

FINDINGS: Normal sized heart. Clear lungs. Unremarkable bones.
IMPRESSION: No acute abnormality.

## 2021-01-05 IMAGING — CT CT ABD-PELV W/ CM
1 series · 11 of 34 positions shown, 13 images · IV contrast (omnipaque)
Comparison: February 16, 2008

CLINICAL DATA: [id number] positive. Generalized abdomen pain.

EXAM:
CT ABDOMEN AND PELVIS WITH CONTRAST
TECHNIQUE: Multidetector CT imaging of the abdomen and pelvis was performed
using the standard protocol following bolus administration of
intravenous contrast.
CONTRAST:  100mL OMNIPAQUE IOHEXOL 300 MG/ML  SOLN

[Series 6: sagittal st · sagittal · 0.65mm/px · 11 of 212 slices shown, 13 images]
[im 14/212  lung]
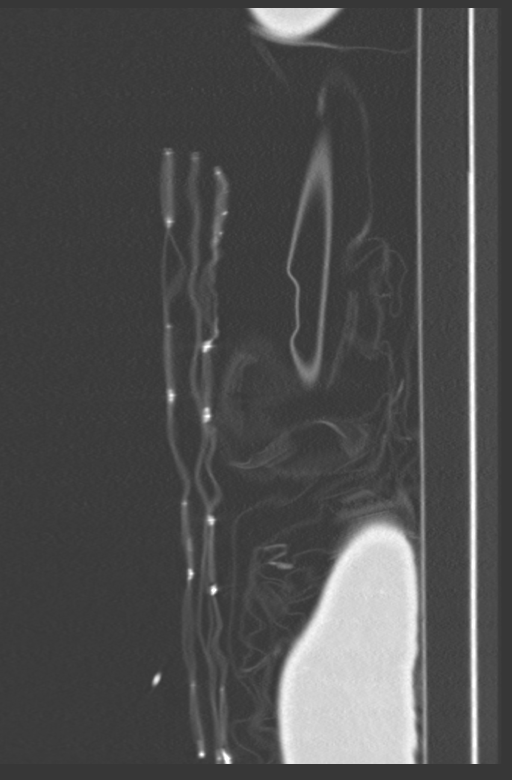
[im 28/212  soft-tissue]
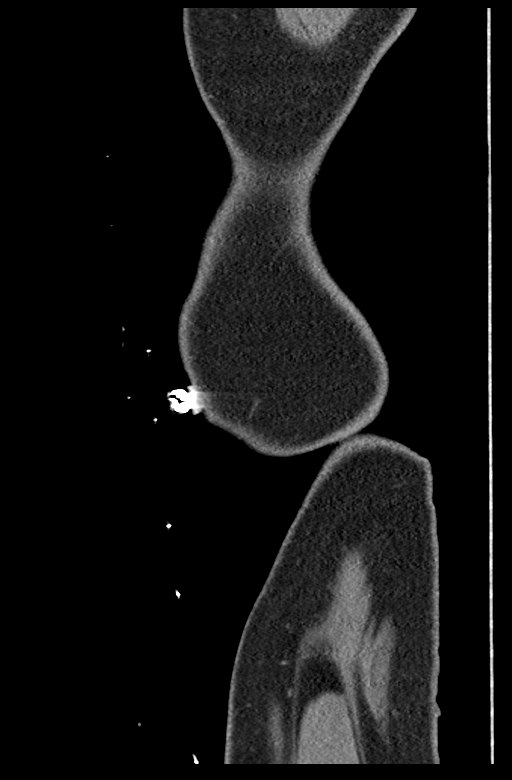
[im 28/212  lung]
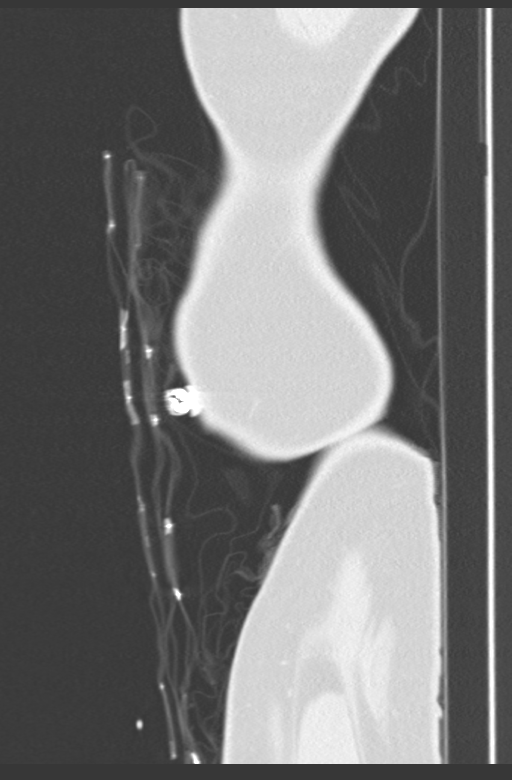
[im 28/212  bone]
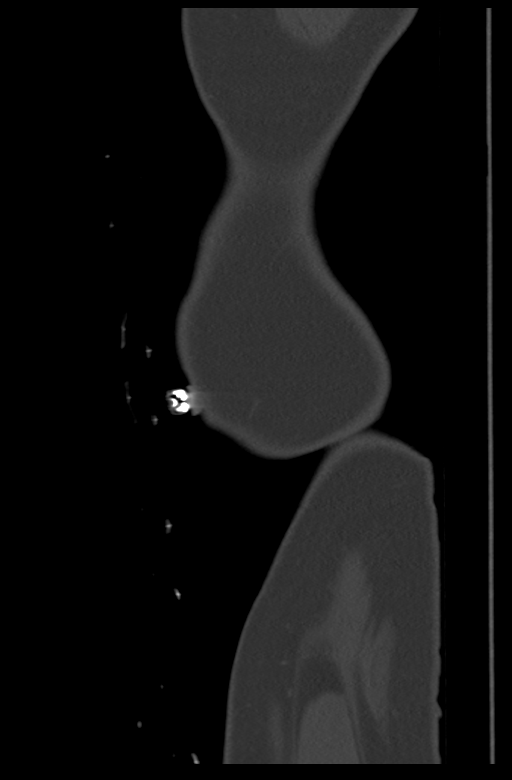
[im 35/212  lung]
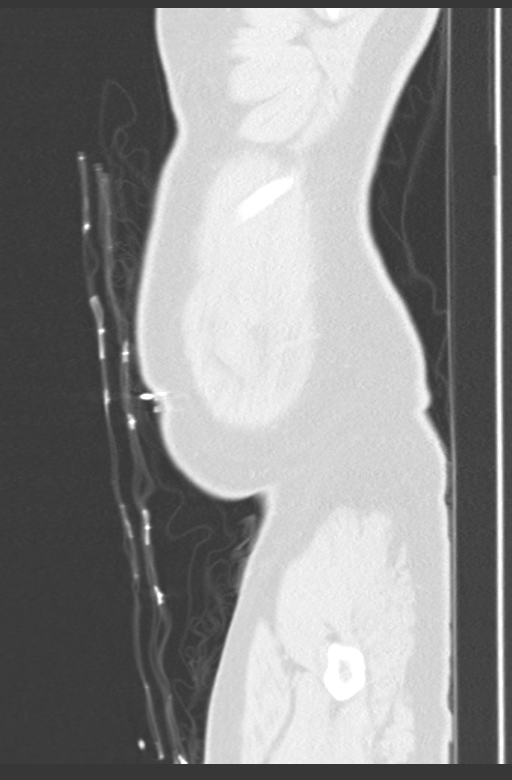
[im 41/212  lung]
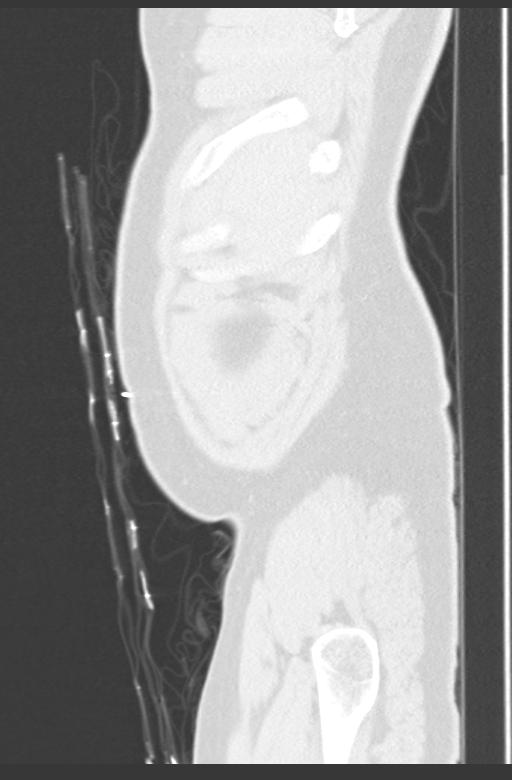
[im 55/212  soft-tissue]
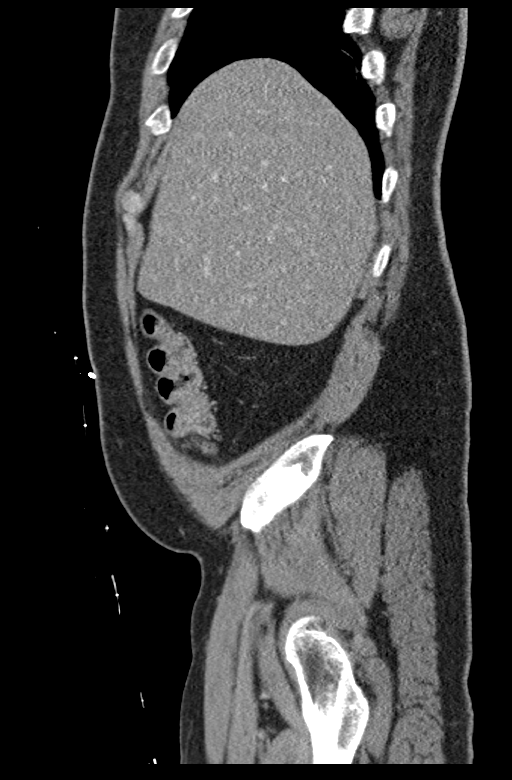
[im 75/212  soft-tissue]
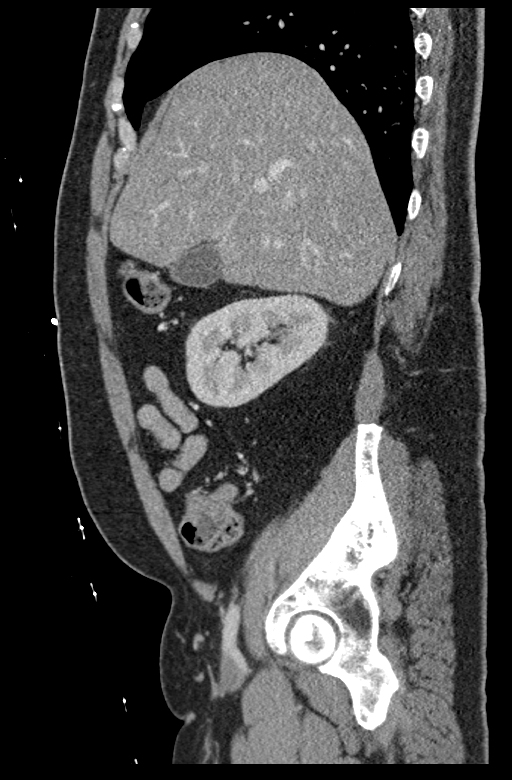
[im 103/212  soft-tissue]
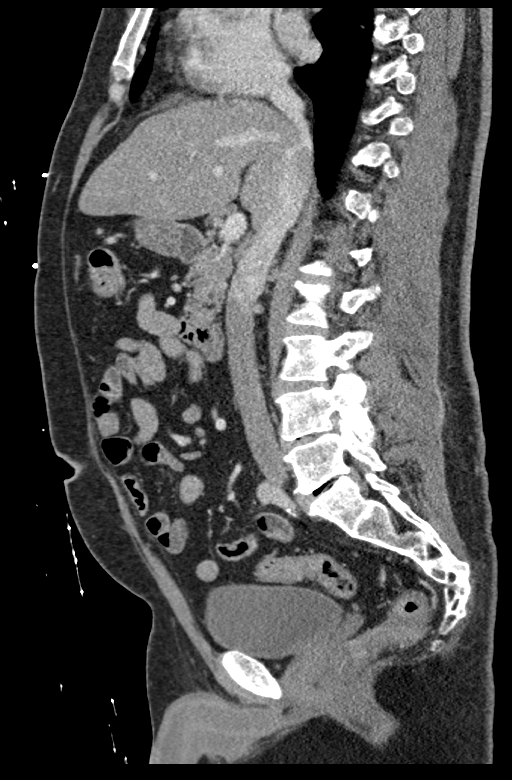
[im 123/212  soft-tissue]
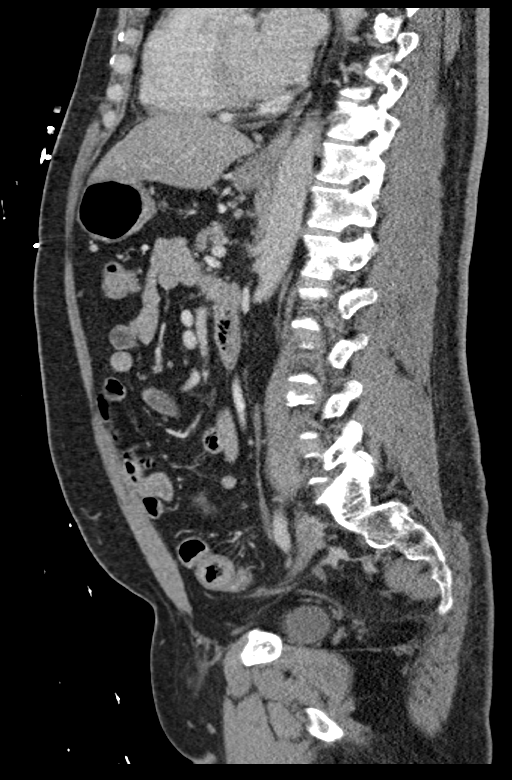
[im 143/212  soft-tissue]
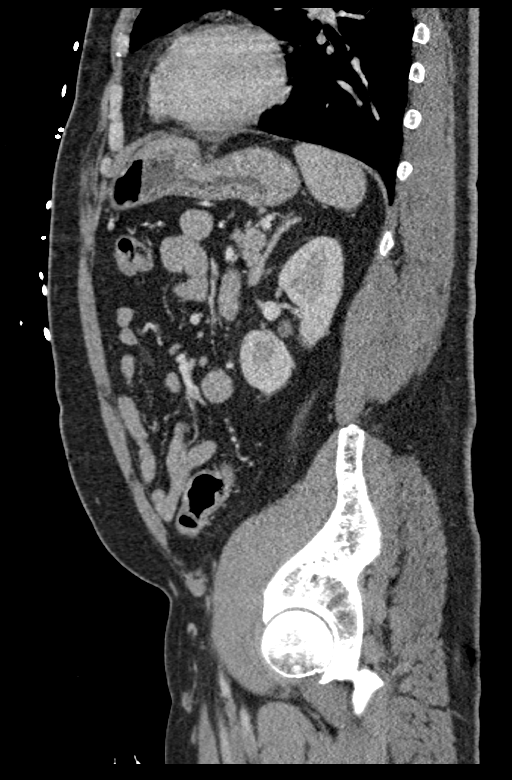
[im 171/212  soft-tissue]
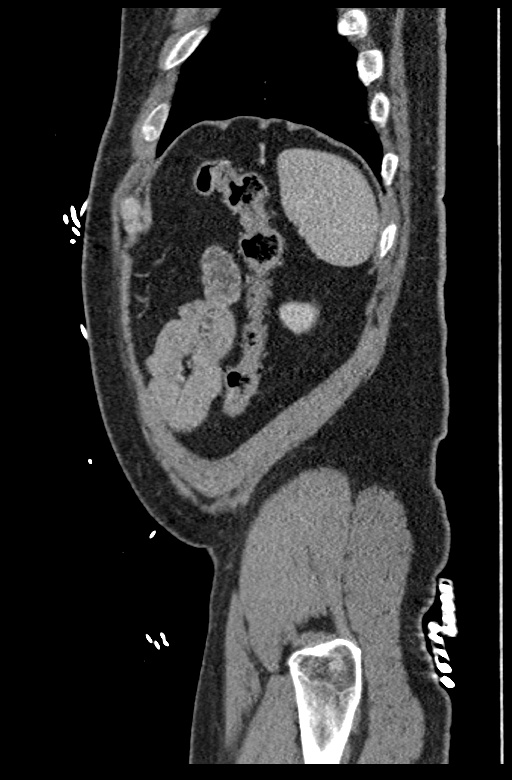
[im 198/212  soft-tissue]
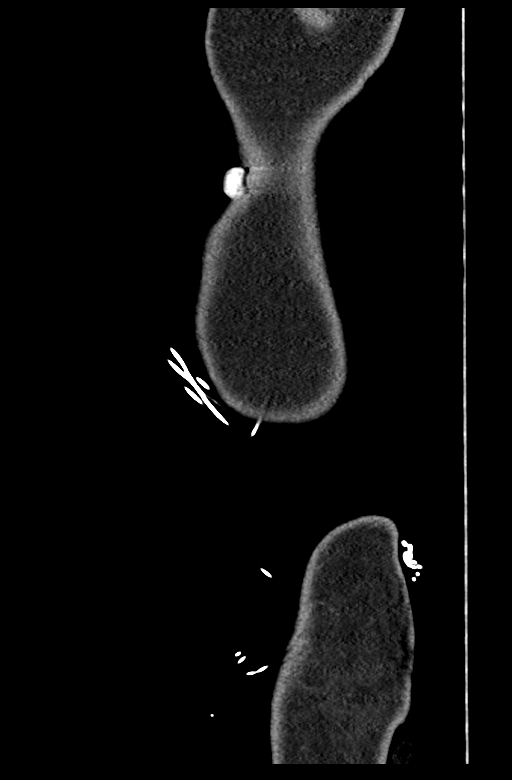

[11 of 34 positions shown; findings below may reference images not displayed]

FINDINGS: Lower chest: Mild patchy opacity is identified in bilateral lower
lobes more prominently involving the right lower lobe. The heart
size is normal.

Hepatobiliary: There is diffuse low density of liver. No focal liver
lesion is identified. The gallbladder is normal. The biliary tree is
normal.

Pancreas: Unremarkable. No pancreatic ductal dilatation or
surrounding inflammatory changes.

Spleen: Normal in size without focal abnormality.

Adrenals/Urinary Tract: Left adrenal nodule is unchanged compared
prior exam. The right adrenal gland is normal. Nonobstructing stones
are identified in both kidneys. No hydronephrosis is identified
bilaterally. Bladder is normal.

Stomach/Bowel: Stomach is within normal limits. Appendix appears
normal. No evidence of bowel wall thickening, distention, or
inflammatory changes.

Vascular/Lymphatic: Aortic atherosclerosis. No enlarged abdominal or
pelvic lymph nodes.

Reproductive: Prostate is unremarkable.

Other: None.

Musculoskeletal: Degenerative joint changes of the spine are noted.
IMPRESSION: 1. Mild patchy consolidation identified in bilateral lower lobes,
more prominently involving the right lower lobe suspicious for
pneumonia.
2. No acute abnormality identified in the abdomen and pelvis.
3. Fatty infiltration of liver.
4. Nonobstructing stones in both kidneys.

## 2021-01-05 IMAGING — CT CT HEAD W/O CM
3 series · 14 of 46 positions shown, 16 images · non-contrast
Comparison: None.

CLINICAL DATA: [id number] positive. Headaches.

EXAM:
CT HEAD WITHOUT CONTRAST
TECHNIQUE: Contiguous axial images were obtained from the base of the skull
through the vertex without intravenous contrast.

[Series 2: head wo · axial · 0.48mm/px · z∈[-185,-65]mm · 8 of 29 slices shown, 10 images]
[im 3/29  brain]
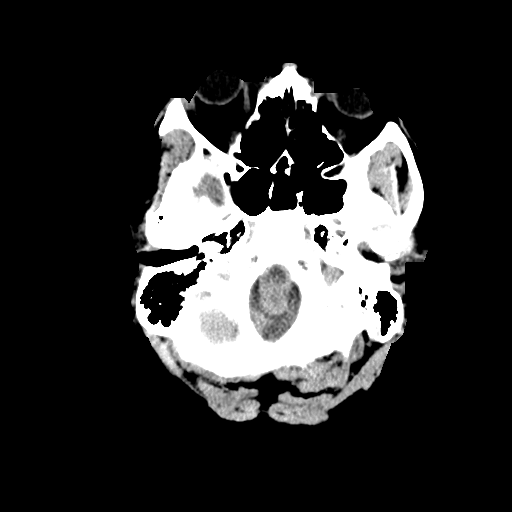
[im 3/29  bone]
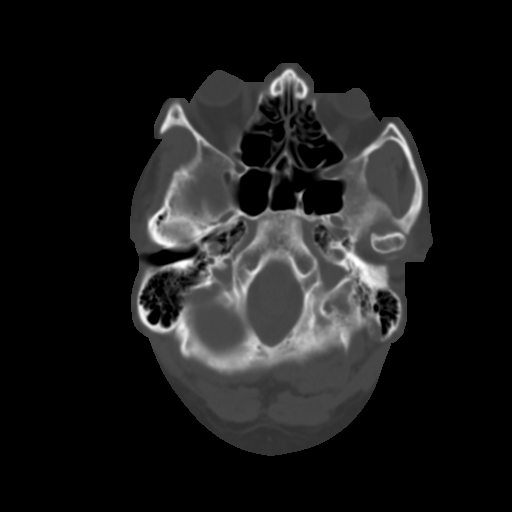
[im 7/29  brain]
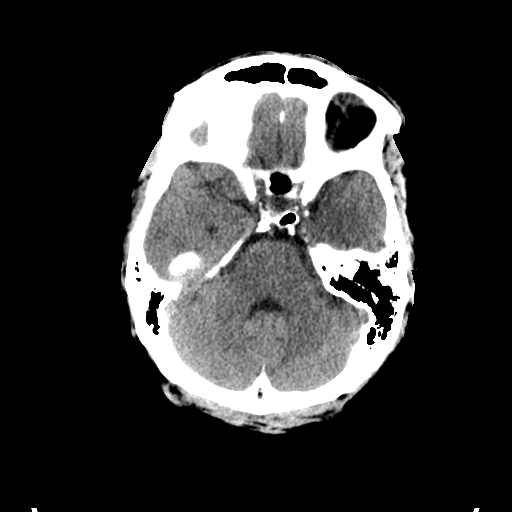
[im 10/29  brain]
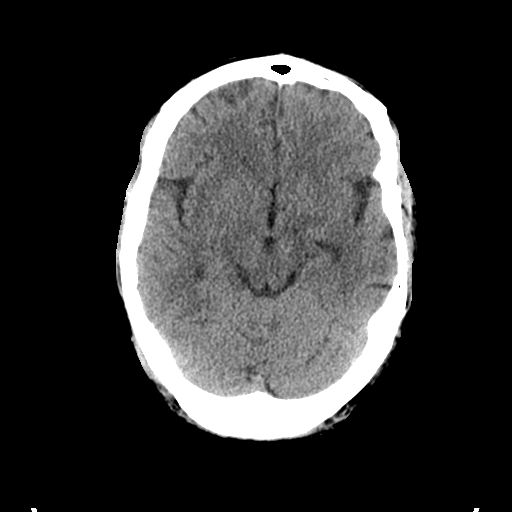
[im 13/29  brain]
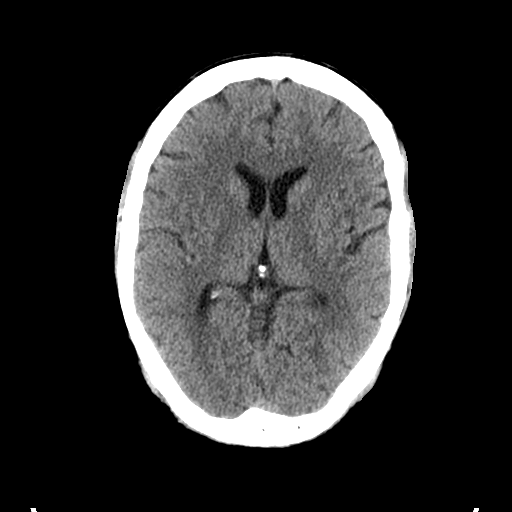
[im 17/29  brain]
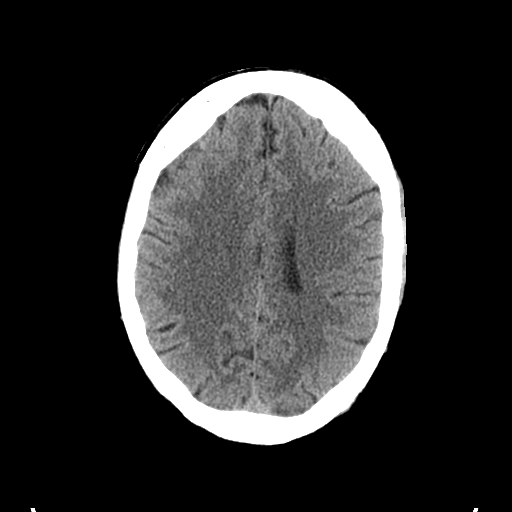
[im 17/29  bone]
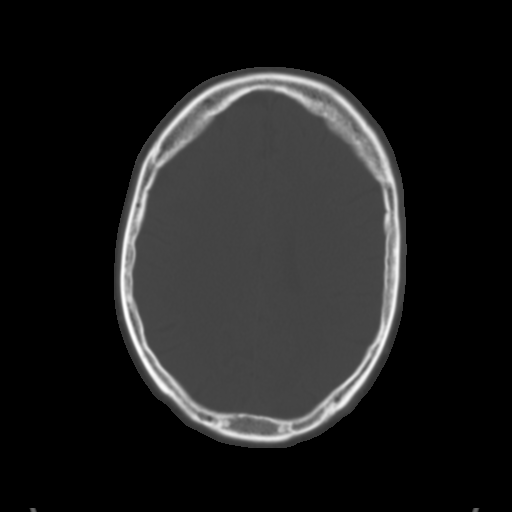
[im 20/29  brain]
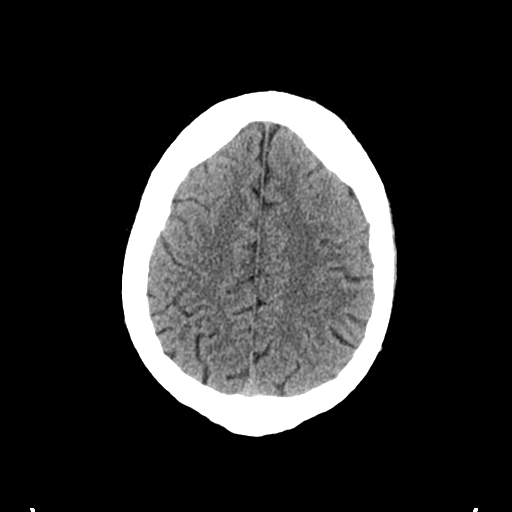
[im 23/29  brain]
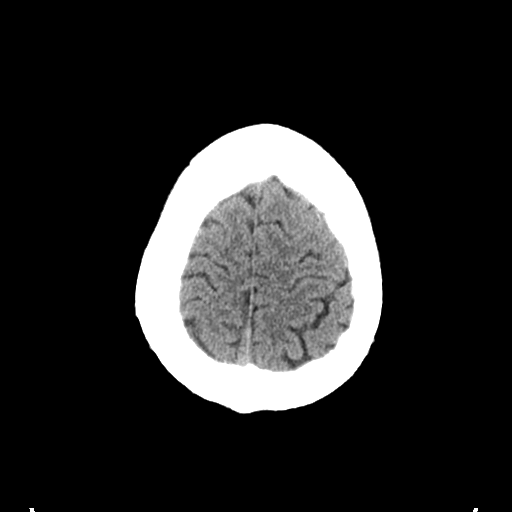
[im 27/29  brain]
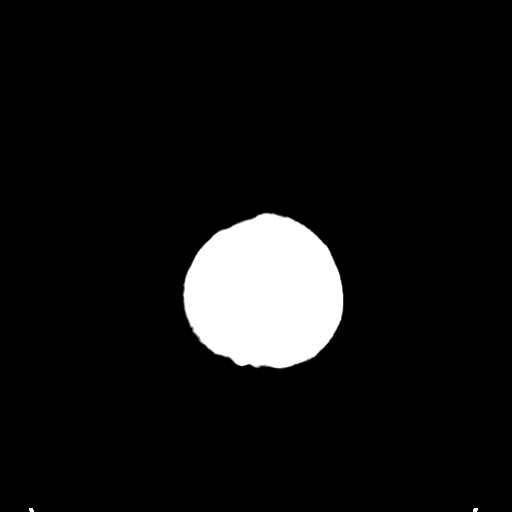

[Series 4: coronal soft tissue · coronal · 0.38mm/px · 3 of 63 slices shown]
[im 21/63  brain]
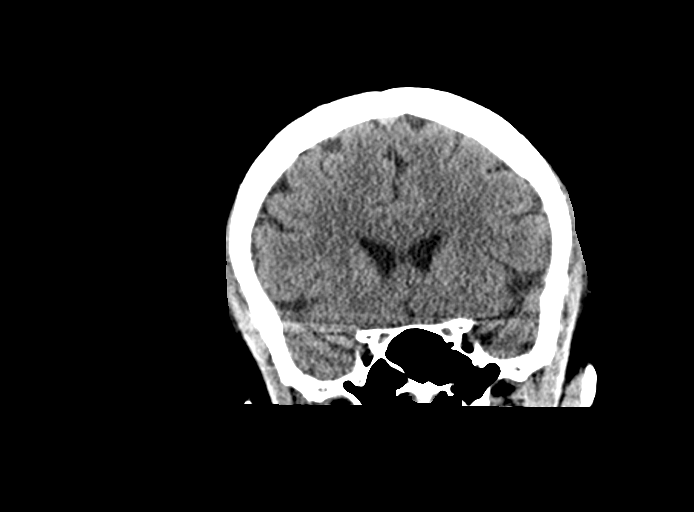
[im 28/63  brain]
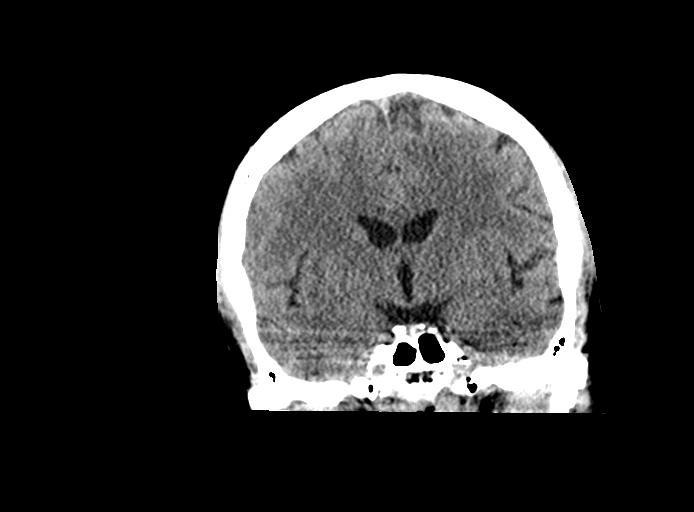
[im 35/63  brain]
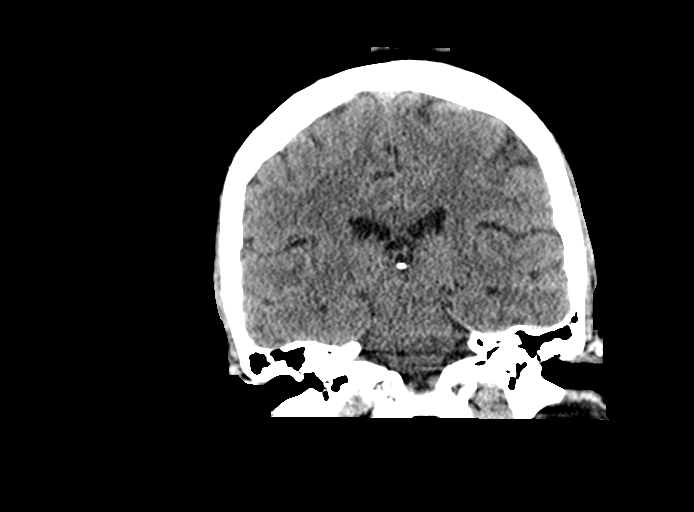

[Series 5: sagittal soft tissue · sagittal · 0.36mm/px · 3 of 51 slices shown]
[im 17/51  brain]
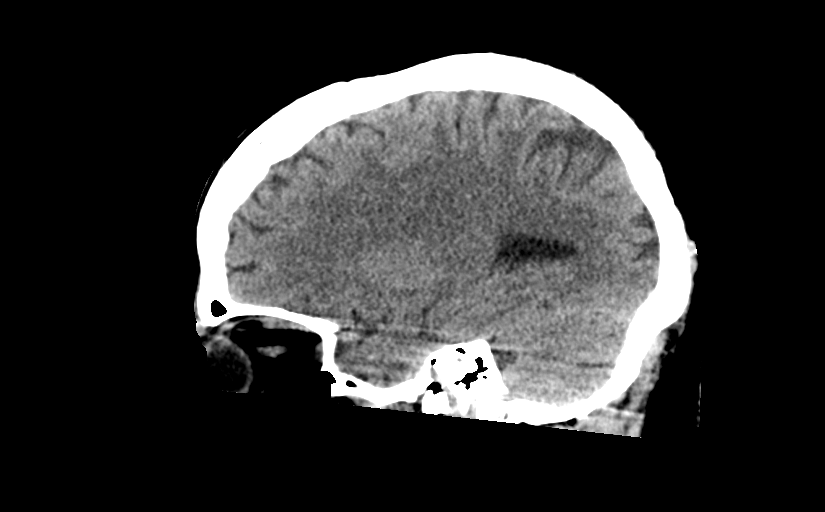
[im 26/51  brain]
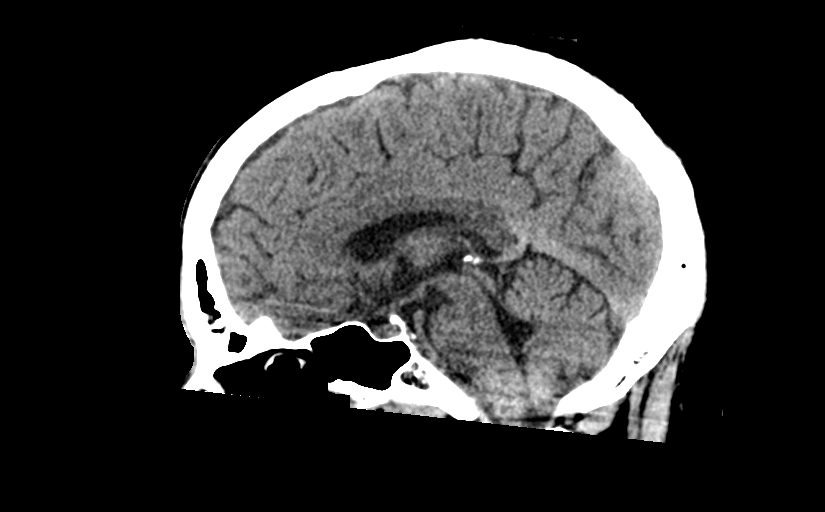
[im 34/51  brain]
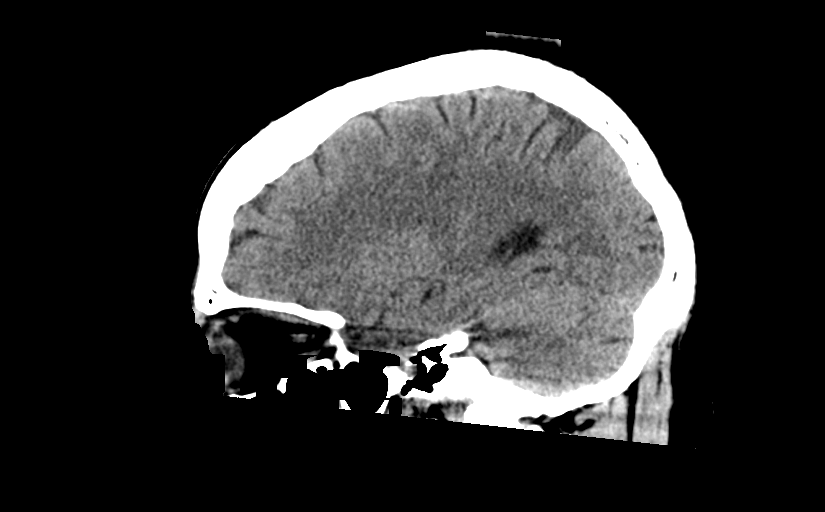

[14 of 46 positions shown; findings below may reference images not displayed]

FINDINGS: Brain: No evidence of acute infarction, hemorrhage, hydrocephalus,
extra-axial collection or mass lesion/mass effect.

Vascular: No hyperdense vessel.

Skull: Normal. Negative for fracture or focal lesion.

Sinuses/Orbits: No acute finding.

Other: None.
IMPRESSION: No focal acute intracranial abnormality identified.

## 2021-03-23 ENCOUNTER — Emergency Department (HOSPITAL_COMMUNITY)
Admission: EM | Admit: 2021-03-23 | Discharge: 2021-03-23 | Disposition: A | Discharge status: Home / Self Care | Payer: 59 | Attending: Emergency Medicine | Admitting: Emergency Medicine

## 2021-03-23 DIAGNOSIS — Z794 Long term (current) use of insulin: Secondary | ICD-10-CM | POA: Diagnosis not present

## 2021-03-23 DIAGNOSIS — M546 Pain in thoracic spine: Secondary | ICD-10-CM | POA: Insufficient documentation

## 2021-03-23 DIAGNOSIS — M542 Cervicalgia: Secondary | ICD-10-CM | POA: Insufficient documentation

## 2021-03-23 DIAGNOSIS — M545 Low back pain, unspecified: Secondary | ICD-10-CM | POA: Diagnosis present

## 2021-03-23 MED ORDER — METHOCARBAMOL 500 MG PO TABS: 500.0000 mg | ORAL_TABLET | Freq: Two times a day (BID) | ORAL | 0 refills | Status: AC | PRN
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
## 2021-03-23 NOTE — Discharge Instructions (Signed)
I am prescribing you a strong muscle relaxer called Robaxin.  You can take this up to 2 times a day for management of your pain.  This medication can be sedating so do not mix it with alcohol.  Do not drive a car after taking it. ? ?If you develop any new or worsening symptoms please do not hesitate to return to the emergency department. It was a pleasure to meet you. ? ?
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
## 2021-04-19 ENCOUNTER — Ambulatory Visit (HOSPITAL_BASED_OUTPATIENT_CLINIC_OR_DEPARTMENT_OTHER): Payer: Medicaid Other | Admitting: Family Medicine

## 2021-05-24 ENCOUNTER — Encounter (HOSPITAL_BASED_OUTPATIENT_CLINIC_OR_DEPARTMENT_OTHER): Payer: Self-pay | Admitting: Family Medicine

## 2021-05-24 ENCOUNTER — Ambulatory Visit: Payer: Medicaid Other | Admitting: Family Medicine

## 2021-05-27 ENCOUNTER — Ambulatory Visit: Payer: Medicare Other | Admitting: Nurse Practitioner

## 2021-07-08 ENCOUNTER — Ambulatory Visit: Payer: Medicare Other | Admitting: Internal Medicine

## 2021-07-21 ENCOUNTER — Ambulatory Visit (INDEPENDENT_AMBULATORY_CARE_PROVIDER_SITE_OTHER): Payer: Medicaid Other | Admitting: Primary Care

## 2021-08-04 ENCOUNTER — Ambulatory Visit (HOSPITAL_COMMUNITY)
Admission: EM | Admit: 2021-08-04 | Discharge: 2021-08-05 | Disposition: A | Discharge status: Other Acute Inpatient Hospital | Payer: Medicare Other | Attending: Psychiatry | Admitting: Psychiatry

## 2021-08-04 DIAGNOSIS — Z20822 Contact with and (suspected) exposure to covid-19: Secondary | ICD-10-CM | POA: Diagnosis not present

## 2021-08-04 DIAGNOSIS — F4310 Post-traumatic stress disorder, unspecified: Secondary | ICD-10-CM | POA: Insufficient documentation

## 2021-08-04 DIAGNOSIS — R45851 Suicidal ideations: Secondary | ICD-10-CM | POA: Insufficient documentation

## 2021-08-04 DIAGNOSIS — F308 Other manic episodes: Secondary | ICD-10-CM | POA: Diagnosis present

## 2021-08-04 LAB — ETHANOL: Alcohol, Ethyl (B): <10 mg/dL (ref <10)

## 2021-08-04 LAB — TSH: TSH: 0.763 u[IU]/mL (ref 0.350–4.500)

## 2021-08-04 LAB — CBC WITH DIFFERENTIAL/PLATELET
Abs Immature Granulocytes: 0.02 10*3/uL (ref 0.00–0.07)
Basophils Absolute: 0 10*3/uL (ref 0.0–0.1)
Basophils Relative: 1 %
Eosinophils Absolute: 0.1 10*3/uL (ref 0.0–0.5)
Eosinophils Relative: 1 %
HCT: 33.2 % — ABNORMAL LOW (ref 39.0–52.0)
Hemoglobin: 11 g/dL — ABNORMAL LOW (ref 13.0–17.0)
Immature Granulocytes: 0 %
Lymphocytes Relative: 26 %
Lymphs Abs: 1.7 10*3/uL (ref 0.7–4.0)
MCH: 29.3 pg (ref 26.0–34.0)
MCHC: 33.1 g/dL (ref 30.0–36.0)
MCV: 88.5 fL (ref 80.0–100.0)
Monocytes Absolute: 0.3 10*3/uL (ref 0.1–1.0)
Monocytes Relative: 4 %
Neutro Abs: 4.5 10*3/uL (ref 1.7–7.7)
Neutrophils Relative %: 68 %
Platelets: 417 10*3/uL — ABNORMAL HIGH (ref 150–400)
RBC: 3.75 MIL/uL — ABNORMAL LOW (ref 4.22–5.81)
RDW: 11.9 % (ref 11.5–15.5)
WBC: 6.6 10*3/uL (ref 4.0–10.5)
nRBC: 0 % (ref 0.0–0.2)

## 2021-08-04 LAB — LIPID PANEL
Cholesterol: 137 mg/dL (ref 0–200)
HDL: 51 mg/dL (ref >40)
LDL Cholesterol: 70 mg/dL (ref 0–99)
Total CHOL/HDL Ratio: 2.7 RATIO
Triglycerides: 81 mg/dL (ref <150)
VLDL: 16 mg/dL (ref 0–40)

## 2021-08-04 LAB — COMPREHENSIVE METABOLIC PANEL
ALT: 21 U/L (ref 0–44)
AST: 21 U/L (ref 15–41)
Albumin: 4.6 g/dL (ref 3.5–5.0)
Alkaline Phosphatase: 77 U/L (ref 38–126)
Anion gap: 8 (ref 5–15)
BUN: 16 mg/dL (ref 6–20)
CO2: 32 mmol/L (ref 22–32)
Calcium: 10.2 mg/dL (ref 8.9–10.3)
Chloride: 98 mmol/L (ref 98–111)
Creatinine, Ser: 1.21 mg/dL (ref 0.61–1.24)
GFR, Estimated: >60 mL/min (ref >60)
Glucose, Bld: 94 mg/dL (ref 70–99)
Potassium: 4.9 mmol/L (ref 3.5–5.1)
Sodium: 138 mmol/L (ref 135–145)
Total Bilirubin: 0.5 mg/dL (ref 0.3–1.2)
Total Protein: 7.5 g/dL (ref 6.5–8.1)

## 2021-08-04 LAB — POCT URINE DRUG SCREEN - MANUAL ENTRY (I-SCREEN)
POC Amphetamine UR: NOT DETECTED
POC Buprenorphine (BUP): NOT DETECTED
POC Cocaine UR: NOT DETECTED
POC Marijuana UR: NOT DETECTED
POC Methadone UR: NOT DETECTED
POC Methamphetamine UR: NOT DETECTED
POC Morphine: NOT DETECTED
POC Oxazepam (BZO): NOT DETECTED
POC Oxycodone UR: NOT DETECTED
POC Secobarbital (BAR): NOT DETECTED

## 2021-08-04 LAB — RESP PANEL BY RT-PCR (FLU A&B, COVID) ARPGX2
Influenza A by PCR: NEGATIVE
Influenza B by PCR: NEGATIVE
SARS Coronavirus 2 by RT PCR: NEGATIVE

## 2021-08-04 LAB — HEMOGLOBIN A1C
Hgb A1c MFr Bld: 7.6 % — ABNORMAL HIGH (ref 4.8–5.6)
Mean Plasma Glucose: 171.42 mg/dL

## 2021-08-04 MED ORDER — ESCITALOPRAM OXALATE 10 MG PO TABS
20.0000 mg | ORAL_TABLET | Freq: Every day | ORAL | Status: DC
  Administered 2021-08-04 – 2021-08-05 (×2): 20 mg via ORAL
  Filled 2021-08-04 (×2): qty 2

## 2021-08-04 MED ORDER — MAGNESIUM HYDROXIDE 400 MG/5ML PO SUSP: 30.0000 mL | Freq: Every day | ORAL | Status: DC | PRN

## 2021-08-04 MED ORDER — ALUM & MAG HYDROXIDE-SIMETH 200-200-20 MG/5ML PO SUSP: 30.0000 mL | ORAL | Status: DC | PRN

## 2021-08-04 MED ORDER — GLIPIZIDE 5 MG PO TABS
10.0000 mg | ORAL_TABLET | Freq: Every day | ORAL | Status: DC
  Administered 2021-08-05: 10 mg via ORAL
  Filled 2021-08-04: qty 2

## 2021-08-04 MED ORDER — NAPROXEN 500 MG PO TABS
500.0000 mg | ORAL_TABLET | Freq: Two times a day (BID) | ORAL | Status: DC
  Administered 2021-08-04 – 2021-08-05 (×2): 500 mg via ORAL
  Filled 2021-08-04 (×2): qty 1

## 2021-08-04 MED ORDER — PRAVASTATIN SODIUM 40 MG PO TABS
40.0000 mg | ORAL_TABLET | Freq: Every day | ORAL | Status: DC
  Administered 2021-08-04: 10 mg via ORAL
  Administered 2021-08-05: 40 mg via ORAL
  Filled 2021-08-04 (×2): qty 4

## 2021-08-04 MED ORDER — QUETIAPINE FUMARATE 50 MG PO TABS
50.0000 mg | ORAL_TABLET | Freq: Every day | ORAL | Status: DC
  Administered 2021-08-04: 50 mg via ORAL
  Filled 2021-08-04: qty 1

## 2021-08-04 MED ORDER — GLUCOSE 4 G PO CHEW: 16.0000 | CHEWABLE_TABLET | ORAL | Status: DC | PRN

## 2021-08-04 MED ORDER — HYDROXYZINE HCL 25 MG PO TABS
25.0000 mg | ORAL_TABLET | Freq: Three times a day (TID) | ORAL | Status: DC | PRN
  Administered 2021-08-04: 25 mg via ORAL
  Filled 2021-08-04: qty 1

## 2021-08-04 MED ORDER — GABAPENTIN 300 MG PO CAPS
600.0000 mg | ORAL_CAPSULE | Freq: Four times a day (QID) | ORAL | Status: DC
  Administered 2021-08-04 – 2021-08-05 (×3): 600 mg via ORAL
  Filled 2021-08-04 (×3): qty 2

## 2021-08-04 MED ORDER — TRAZODONE HCL 50 MG PO TABS
50.0000 mg | ORAL_TABLET | Freq: Every evening | ORAL | Status: DC | PRN
Start: 2021-08-04 — End: 2021-08-04

## 2021-08-04 MED ORDER — METFORMIN HCL ER 500 MG PO TB24
500.0000 mg | ORAL_TABLET | Freq: Two times a day (BID) | ORAL | Status: DC
  Administered 2021-08-04 – 2021-08-05 (×2): 500 mg via ORAL
  Filled 2021-08-04 (×2): qty 1

## 2021-08-04 MED ORDER — LORAZEPAM 1 MG PO TABS: 1.0000 mg | ORAL_TABLET | ORAL | Status: DC | PRN

## 2021-08-04 MED ORDER — ACETAMINOPHEN 325 MG PO TABS: 650.0000 mg | ORAL_TABLET | Freq: Four times a day (QID) | ORAL | Status: DC | PRN

## 2021-08-04 MED ORDER — ZIPRASIDONE MESYLATE 20 MG IM SOLR: 20.0000 mg | INTRAMUSCULAR | Status: DC | PRN

## 2021-08-04 MED ORDER — METHADONE HCL 10 MG PO TABS
50.0000 mg | ORAL_TABLET | Freq: Every day | ORAL | Status: DC
  Administered 2021-08-05: 50 mg via ORAL
  Filled 2021-08-04: qty 5

## 2021-08-04 MED ORDER — OLANZAPINE 5 MG PO TBDP: 5.0000 mg | ORAL_TABLET | Freq: Three times a day (TID) | ORAL | Status: DC | PRN

## 2021-08-04 MED ORDER — DILTIAZEM HCL ER COATED BEADS 180 MG PO CP24
180.0000 mg | ORAL_CAPSULE | Freq: Every day | ORAL | Status: DC
  Administered 2021-08-04 – 2021-08-05 (×2): 180 mg via ORAL
  Filled 2021-08-04 (×2): qty 1

## 2021-08-04 NOTE — Progress Notes (Signed)
Pt alert and oriented during admission process. Pt denies SI/HI, AVH, and any pain. Pt oriented to unit and is calm and cooperative. Support, reassurance, and encouragement provided. Pt ambulating on the unit with no issues. Pt remains safe on the unit.

## 2021-08-04 NOTE — ED Notes (Signed)
Pt sleeping@this time. Breathing even and unlabored. Will continue to monitor for safety 
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
## 2021-08-04 NOTE — Progress Notes (Signed)
Pt was accepted to [facility] 08/04/21; Bed Assignment Deatra Canter A  Pt meets inpatient criteria per Darrick Grinder, NP  Attending Physician will be Dr. Forrestine Him  Report can be called to: [phone]  Pt can arrive after: Bed is ready  Care Team notified: Dossie Arbour, RN  Kelton Pillar, LCSWA 08/04/2021 @ 4:43 PM

## 2021-08-04 NOTE — ED Notes (Signed)
Pt was given dinner. 

## 2021-08-04 NOTE — ED Notes (Signed)
Pt every confused, looking for keys, pt redirected numerous times.  Pt is IVC, monitoring for safety.

## 2021-08-04 NOTE — Progress Notes (Signed)
Inpatient Behavioral Health Placement  Pt meets inpatient criteria per Lauree Chandler, NP. There are no available beds at [facility] per [facility] Malva Limes, RN. Referral was sent to the following facilities;   Destination Service Provider Address Phone Fax  [facility]  [address] [place] [address] [phone]  [facility]  [address] [place] [address] [phone]  [facility] [address]  [address], [place] [address] [phone]  [facility]  [address] [place], [place] [address] [phone]  [facility]  [address] [place] [address] [phone]  [facility]  [address] [place], [place] [address] [phone]  [facility]  [address] [place] [address] [phone]  [facility]  [address] [place], [place] [address] [phone]  [facility]  [address] [place] [address] [phone]  [facility]  [address] [place] [address] [phone]  [facility]  [address] [place] [address] [phone]  [facility]  [address], [place] [address] [phone]  [facility]  [address] [place] [address] [phone]  [facility]  [address], [place] [address] [phone]  [facility]  [address] [place] [address] [phone]  [facility]  [address] [place] [address] [phone]  [facility]   [address], [place] [address] [phone]  [facility]  [address] [place] [address] [phone]  [facility]  [address] [place] [address] [phone]    Situation ongoing,  CSW will follow up.   Maryjean Ka, MSW, LCSWA 08/04/2021  @ 2:17 PM

## 2021-08-05 DIAGNOSIS — F4310 Post-traumatic stress disorder, unspecified: Secondary | ICD-10-CM | POA: Diagnosis not present

## 2021-08-05 NOTE — ED Notes (Signed)
Pt sleeping at present, no distress noted.  Monitoring for safety. 

## 2021-08-05 NOTE — ED Notes (Signed)
Pt sleeping in no acute distress. RR even and unlabored. Safety maintained. 

## 2021-08-05 NOTE — ED Notes (Signed)
Pt sleeping but easily awakened to name being called. Pt continue to endorse passive SI and hallucinations, no commands. Pt states, "I feel the same as I did yesterday. Nothing has changed". Support provided. Informed pt that he would be transferred to [facility] this am. Pt verbalized understanding. No aggressive behaviors noted. Informed pt to notify staff with any needs or concerns. Will continue to monitor for safety.

## 2021-08-05 NOTE — ED Notes (Signed)
Report given to Labrea, nursing supervisor at [facility]. Sheriff called for transport.

## 2021-08-05 NOTE — ED Notes (Signed)
Sheriff transport called stating they were approx. 10 min out for transporting pt to [facility]. Pt notified. Pt stripped his bed and awaiting transport. Safety maintained.

## 2021-08-05 NOTE — ED Notes (Signed)
Patient A&O x 4, ambulatory. Patient transferred to [facility] in no acute distress. Patient denied SI/HI, A/VH upon discharge. Patient reported mood 10/10.  Pt belongings given to Emergency planning/management officer from locker #31  intact. Patient escorted to [employer] via staff for transport to [facility]. Safety maintained.

## 2021-08-05 NOTE — ED Notes (Signed)
Pt was given a muffin and cereal for breakfast. ?

## 2021-08-23 ENCOUNTER — Ambulatory Visit (HOSPITAL_BASED_OUTPATIENT_CLINIC_OR_DEPARTMENT_OTHER): Payer: Medicaid Other | Admitting: Family Medicine

## 2021-08-30 ENCOUNTER — Encounter (HOSPITAL_BASED_OUTPATIENT_CLINIC_OR_DEPARTMENT_OTHER): Payer: Self-pay | Admitting: Family Medicine

## 2021-09-28 ENCOUNTER — Emergency Department (HOSPITAL_COMMUNITY): Payer: Medicare Other

## 2021-09-28 ENCOUNTER — Emergency Department (HOSPITAL_COMMUNITY)
Admission: EM | Admit: 2021-09-28 | Discharge: 2021-10-02 | Disposition: A | Discharge status: Home / Self Care | Payer: Medicare Other | Attending: Emergency Medicine | Admitting: Emergency Medicine

## 2021-09-28 ENCOUNTER — Encounter (HOSPITAL_COMMUNITY): Payer: Self-pay

## 2021-09-28 ENCOUNTER — Other Ambulatory Visit: Payer: Self-pay

## 2021-09-28 DIAGNOSIS — F4310 Post-traumatic stress disorder, unspecified: Secondary | ICD-10-CM | POA: Diagnosis not present

## 2021-09-28 DIAGNOSIS — R451 Restlessness and agitation: Secondary | ICD-10-CM | POA: Diagnosis not present

## 2021-09-28 DIAGNOSIS — K5900 Constipation, unspecified: Secondary | ICD-10-CM | POA: Diagnosis not present

## 2021-09-28 DIAGNOSIS — R456 Violent behavior: Secondary | ICD-10-CM | POA: Insufficient documentation

## 2021-09-28 DIAGNOSIS — R4689 Other symptoms and signs involving appearance and behavior: Secondary | ICD-10-CM | POA: Diagnosis not present

## 2021-09-28 DIAGNOSIS — Z79899 Other long term (current) drug therapy: Secondary | ICD-10-CM | POA: Insufficient documentation

## 2021-09-28 DIAGNOSIS — Z20822 Contact with and (suspected) exposure to covid-19: Secondary | ICD-10-CM | POA: Insufficient documentation

## 2021-09-28 DIAGNOSIS — D72829 Elevated white blood cell count, unspecified: Secondary | ICD-10-CM | POA: Diagnosis not present

## 2021-09-28 DIAGNOSIS — Z046 Encounter for general psychiatric examination, requested by authority: Secondary | ICD-10-CM | POA: Diagnosis not present

## 2021-09-28 DIAGNOSIS — F1120 Opioid dependence, uncomplicated: Secondary | ICD-10-CM | POA: Diagnosis not present

## 2021-09-28 LAB — CBC WITH DIFFERENTIAL/PLATELET
Abs Immature Granulocytes: 0.04 10*3/uL (ref 0.00–0.07)
Basophils Absolute: 0 10*3/uL (ref 0.0–0.1)
Basophils Relative: 0 %
Eosinophils Absolute: 0 10*3/uL (ref 0.0–0.5)
Eosinophils Relative: 0 %
HCT: 33.6 % — ABNORMAL LOW (ref 39.0–52.0)
Hemoglobin: 10.9 g/dL — ABNORMAL LOW (ref 13.0–17.0)
Immature Granulocytes: 0 %
Lymphocytes Relative: 15 %
Lymphs Abs: 2.3 10*3/uL (ref 0.7–4.0)
MCH: 29.9 pg (ref 26.0–34.0)
MCHC: 32.4 g/dL (ref 30.0–36.0)
MCV: 92.1 fL (ref 80.0–100.0)
Monocytes Absolute: 1 10*3/uL (ref 0.1–1.0)
Monocytes Relative: 6 %
Neutro Abs: 12.1 10*3/uL — ABNORMAL HIGH (ref 1.7–7.7)
Neutrophils Relative %: 79 %
Platelets: 385 10*3/uL (ref 150–400)
RBC: 3.65 MIL/uL — ABNORMAL LOW (ref 4.22–5.81)
RDW: 11.9 % (ref 11.5–15.5)
WBC: 15.5 10*3/uL — ABNORMAL HIGH (ref 4.0–10.5)
nRBC: 0 % (ref 0.0–0.2)

## 2021-09-28 LAB — URINALYSIS, ROUTINE W REFLEX MICROSCOPIC
Bilirubin Urine: NEGATIVE
Glucose, UA: NEGATIVE mg/dL
Hgb urine dipstick: NEGATIVE
Ketones, ur: NEGATIVE mg/dL
Nitrite: NEGATIVE
Protein, ur: NEGATIVE mg/dL
Specific Gravity, Urine: 1.017 (ref 1.005–1.030)
pH: 6 (ref 5.0–8.0)

## 2021-09-28 LAB — RAPID URINE DRUG SCREEN, HOSP PERFORMED
Amphetamines: NOT DETECTED
Barbiturates: NOT DETECTED
Benzodiazepines: NOT DETECTED
Cocaine: NOT DETECTED
Opiates: NOT DETECTED
Tetrahydrocannabinol: NOT DETECTED

## 2021-09-28 LAB — RESP PANEL BY RT-PCR (FLU A&B, COVID) ARPGX2
Influenza A by PCR: NEGATIVE
Influenza B by PCR: NEGATIVE
SARS Coronavirus 2 by RT PCR: NEGATIVE

## 2021-09-28 LAB — COMPREHENSIVE METABOLIC PANEL
ALT: 18 U/L (ref 0–44)
AST: 40 U/L (ref 15–41)
Albumin: 4.6 g/dL (ref 3.5–5.0)
Alkaline Phosphatase: 71 U/L (ref 38–126)
Anion gap: 7 (ref 5–15)
BUN: 17 mg/dL (ref 6–20)
CO2: 30 mmol/L (ref 22–32)
Calcium: 9.6 mg/dL (ref 8.9–10.3)
Chloride: 101 mmol/L (ref 98–111)
Creatinine, Ser: 1.12 mg/dL (ref 0.61–1.24)
GFR, Estimated: >60 mL/min (ref >60)
Glucose, Bld: 154 mg/dL — ABNORMAL HIGH (ref 70–99)
Potassium: 4.7 mmol/L (ref 3.5–5.1)
Sodium: 138 mmol/L (ref 135–145)
Total Bilirubin: 1 mg/dL (ref 0.3–1.2)
Total Protein: 7.8 g/dL (ref 6.5–8.1)

## 2021-09-28 LAB — ETHANOL: Alcohol, Ethyl (B): <10 mg/dL (ref <10)

## 2021-09-28 LAB — CBG MONITORING, ED: Glucose-Capillary: 80 mg/dL (ref 70–99)

## 2021-09-28 MED ORDER — PRAVASTATIN SODIUM 20 MG PO TABS
40.0000 mg | ORAL_TABLET | Freq: Every day | ORAL | Status: DC
  Administered 2021-09-28 – 2021-10-02 (×5): 40 mg via ORAL
  Filled 2021-09-28 (×5): qty 2

## 2021-09-28 MED ORDER — CEFTRIAXONE SODIUM 1 G IJ SOLR
1.0000 g | Freq: Once | INTRAMUSCULAR | Status: AC
  Administered 2021-09-28: 1 g via INTRAMUSCULAR
  Filled 2021-09-28: qty 10

## 2021-09-28 MED ORDER — GABAPENTIN 300 MG PO CAPS
600.0000 mg | ORAL_CAPSULE | Freq: Four times a day (QID) | ORAL | Status: DC
  Administered 2021-09-28 – 2021-10-02 (×14): 600 mg via ORAL
  Filled 2021-09-28 (×14): qty 2

## 2021-09-28 MED ORDER — METHADONE HCL 10 MG/ML PO CONC
60.0000 mg | Freq: Every day | ORAL | Status: DC
  Administered 2021-09-29 – 2021-09-30 (×2): 60 mg via ORAL
  Filled 2021-09-28 (×3): qty 10

## 2021-09-28 MED ORDER — LORAZEPAM 2 MG/ML IJ SOLN
2.0000 mg | Freq: Once | INTRAMUSCULAR | Status: AC
  Administered 2021-09-28: 2 mg via INTRAMUSCULAR
  Filled 2021-09-28: qty 1

## 2021-09-28 MED ORDER — GLIPIZIDE 10 MG PO TABS
10.0000 mg | ORAL_TABLET | Freq: Every day | ORAL | Status: DC
  Administered 2021-09-29 – 2021-10-02 (×4): 10 mg via ORAL
  Filled 2021-09-28 (×4): qty 1

## 2021-09-28 MED ORDER — DILTIAZEM HCL ER BEADS 180 MG PO CP24: 180.0000 mg | ORAL_CAPSULE | Freq: Every day | ORAL | Status: DC

## 2021-09-28 MED ORDER — MELATONIN 3 MG PO TABS
6.0000 mg | ORAL_TABLET | Freq: Every day | ORAL | Status: DC
  Administered 2021-09-29 – 2021-10-01 (×3): 6 mg via ORAL
  Filled 2021-09-28 (×3): qty 2

## 2021-09-28 MED ORDER — ESCITALOPRAM OXALATE 10 MG PO TABS
20.0000 mg | ORAL_TABLET | Freq: Every day | ORAL | Status: DC
  Administered 2021-09-28 – 2021-10-02 (×5): 20 mg via ORAL
  Filled 2021-09-28 (×5): qty 2

## 2021-09-28 MED ORDER — HALOPERIDOL LACTATE 5 MG/ML IJ SOLN: 5.0000 mg | Freq: Once | INTRAMUSCULAR | Status: AC

## 2021-09-28 MED ORDER — GLUCOSE 4 G PO CHEW: 16.0000 | CHEWABLE_TABLET | ORAL | Status: DC | PRN

## 2021-09-28 MED ORDER — AMOXICILLIN-POT CLAVULANATE 875-125 MG PO TABS
1.0000 | ORAL_TABLET | Freq: Two times a day (BID) | ORAL | Status: DC
  Administered 2021-09-29 – 2021-10-02 (×7): 1 via ORAL
  Filled 2021-09-28 (×7): qty 1

## 2021-09-28 MED ORDER — METHADONE HCL 5 MG PO TABS: 50.0000 mg | ORAL_TABLET | Freq: Every day | ORAL | Status: DC

## 2021-09-28 MED ORDER — DIPHENHYDRAMINE HCL 50 MG/ML IJ SOLN
25.0000 mg | Freq: Once | INTRAMUSCULAR | Status: AC
  Administered 2021-09-28: 25 mg via INTRAMUSCULAR
  Filled 2021-09-28: qty 1

## 2021-09-28 MED ORDER — SULFAMETHOXAZOLE-TRIMETHOPRIM 800-160 MG PO TABS: 1.0000 | ORAL_TABLET | Freq: Two times a day (BID) | ORAL | Status: DC

## 2021-09-28 MED ORDER — DILTIAZEM HCL ER COATED BEADS 180 MG PO CP24
180.0000 mg | ORAL_CAPSULE | Freq: Every day | ORAL | Status: DC
  Administered 2021-09-28 – 2021-10-02 (×5): 180 mg via ORAL
  Filled 2021-09-28 (×5): qty 1

## 2021-09-28 MED ORDER — NAPROXEN 500 MG PO TABS
500.0000 mg | ORAL_TABLET | Freq: Two times a day (BID) | ORAL | Status: DC
  Administered 2021-09-29 – 2021-10-02 (×7): 500 mg via ORAL
  Filled 2021-09-28 (×7): qty 1

## 2021-09-28 MED ORDER — METFORMIN HCL ER 500 MG PO TB24
500.0000 mg | ORAL_TABLET | Freq: Two times a day (BID) | ORAL | Status: DC
  Administered 2021-09-29 – 2021-10-02 (×7): 500 mg via ORAL
  Filled 2021-09-28 (×9): qty 1

## 2021-09-28 MED ORDER — HALOPERIDOL 5 MG PO TABS
5.0000 mg | ORAL_TABLET | Freq: Once | ORAL | Status: AC
  Administered 2021-09-28: 5 mg via ORAL
  Filled 2021-09-28: qty 1

## 2021-09-28 NOTE — ED Notes (Signed)
Pt at edge of doorway, sticking his hand under the hand sanitizer, then placing sanitizer in his mouth. Pt informed he must leave the hand sanitizer alone. Pt redirectable at this time.
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
## 2021-09-28 NOTE — ED Notes (Signed)
Assisted pt to TR6 and patients states what you have to eat. I explained to patient that I cant feed him until assessed by doctor. Pt then says well I am going to bathroom I need some food after.

## 2021-09-28 NOTE — ED Triage Notes (Signed)
BIBA for constipation, LBM yesterday and was normal Ems reports mother was requesting psych re-eval.

## 2021-09-28 NOTE — ED Notes (Signed)
Pt mother at bedside. Updated her per pt approval. Pt resting in bed at this time.

## 2021-09-28 NOTE — ED Notes (Signed)
Pt compliant w/ med admin

## 2021-09-29 DIAGNOSIS — R4689 Other symptoms and signs involving appearance and behavior: Secondary | ICD-10-CM

## 2021-09-29 DIAGNOSIS — F1120 Opioid dependence, uncomplicated: Secondary | ICD-10-CM | POA: Diagnosis present

## 2021-09-29 DIAGNOSIS — F4310 Post-traumatic stress disorder, unspecified: Secondary | ICD-10-CM | POA: Diagnosis present

## 2021-09-29 MED ORDER — OLANZAPINE 2.5 MG PO TABS
2.5000 mg | ORAL_TABLET | ORAL | Status: AC
  Administered 2021-09-29: 2.5 mg via ORAL
  Filled 2021-09-29: qty 1

## 2021-09-29 MED ORDER — LORAZEPAM 1 MG PO TABS
0.0000 mg | ORAL_TABLET | Freq: Four times a day (QID) | ORAL | Status: DC
  Filled 2021-09-29: qty 1
  Filled 2021-09-29: qty 2

## 2021-09-29 MED ORDER — HALOPERIDOL LACTATE 5 MG/ML IJ SOLN
5.0000 mg | Freq: Once | INTRAMUSCULAR | Status: AC
  Administered 2021-09-29: 5 mg via INTRAMUSCULAR
  Filled 2021-09-29: qty 1

## 2021-09-29 MED ORDER — LORAZEPAM 1 MG PO TABS: 0.0000 mg | ORAL_TABLET | Freq: Two times a day (BID) | ORAL | Status: DC

## 2021-09-29 MED ORDER — THIAMINE HCL 100 MG/ML IJ SOLN: 100.0000 mg | Freq: Every day | INTRAMUSCULAR | Status: DC

## 2021-09-29 MED ORDER — OLANZAPINE 5 MG PO TABS
5.0000 mg | ORAL_TABLET | Freq: Every day | ORAL | Status: DC
  Administered 2021-09-29 – 2021-10-01 (×3): 5 mg via ORAL
  Filled 2021-09-29 (×2): qty 1

## 2021-09-29 MED ORDER — LORAZEPAM 2 MG/ML IJ SOLN
0.0000 mg | Freq: Four times a day (QID) | INTRAMUSCULAR | Status: DC
  Administered 2021-09-29: 1 mg via INTRAVENOUS

## 2021-09-29 MED ORDER — LORAZEPAM 2 MG/ML IJ SOLN: 0.0000 mg | Freq: Two times a day (BID) | INTRAMUSCULAR | Status: DC

## 2021-09-29 MED ORDER — THIAMINE MONONITRATE 100 MG PO TABS
100.0000 mg | ORAL_TABLET | Freq: Every day | ORAL | Status: DC
  Administered 2021-09-29 – 2021-10-02 (×4): 100 mg via ORAL
  Filled 2021-09-29 (×4): qty 1

## 2021-09-29 NOTE — ED Provider Notes (Signed)
Emergency Medicine Observation Re-evaluation Note  Lucas White is a 61 y.o. male, seen on rounds today.  Pt initially presented to the ED for complaints of constipation.  On initial evaluation however patient appeared to have decompensated underlying bipolar disease with aggression and homicidal ideation.  He was IVC for his aggressive behaviors.  Medically cleared yesterday.  Currently, the patient is resting comfortably in no acute distress.Marland Kitchen  Physical Exam  BP 114/67 (BP Location: Right Arm)   Pulse 84   Temp 97.7 F (36.5 C) (Oral)   Resp 18   Ht 5\' 11"  (1.803 m)   Wt 90 kg   SpO2 98%   BMI 27.67 kg/m  Physical Exam General: Appears to be resting comfortably in bed, no acute distress. Cardiac: Regular rate, normal heart rate, non-emergent blood pressure for this morning's vitals. Lungs: No increased work of breathing.  Equal chest rise appreciated Psych: Calm, asleep in bed.   ED Course / MDM  EKG:EKG Interpretation  Date/Time:  Wednesday September 28 2021 13:24:04 EDT Ventricular Rate:  93 PR Interval:  140 QRS Duration: 80 QT Interval:  343 QTC Calculation: 427 R Axis:   64 Text Interpretation: Sinus rhythm Low voltage, extremity and precordial leads Confirmed by Garnette Gunner [phone]) on 09/28/2021 1:33:53 PM  I have reviewed the labs performed to date as well as medications administered while in observation.  Recent changes in the last 24 hours include initiation of psychiatric hold protocol.  Plan  Current plan is for continuation of psychiatric hold protocol.  We will continue treatment for potential pneumonia with Augmentin as ordered. If psychiatrically placed, will need to ensure antibiotics are communicated to facility.  Pending psychiatric recommendations.    Tretha Sciara, MD 09/29/21 [phone]
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
## 2021-09-29 NOTE — ED Notes (Signed)
Patient is hallucinating saying his sons are here. They are missing and they were just standing here and now they are gone. MD notified and  new orders have been given.

## 2021-09-29 NOTE — Progress Notes (Signed)
Inpatient Behavioral Health Placement  Pt meets inpatient criteria per Lindon Romp,  NP.  Referral was sent to the following facilities;    Destination Service Provider Address Phone Fax  [facility]  [address] [place] [address] [phone]  [place]  [address] [place] [address] [phone]  [facility]  [place], [place] [address] [phone]  [facility]  [address] [place] [address] [place]  [facility]  [address] [place] [address] [phone]  [facility]  [place], [place] [address] [phone] [facility]  [address] [place], [place] [address] [phone] [facility]  [address] [place] [address] [place]  [facility]  [address] [place] [address] [phone]  [facility]  [address] [place] [address] [phone]  [place] [address], [place] [address] [phone]  [facility]  [address] [place] [address] [phone]  [facility]  [place] [address] [phone] [place]  [address] [place] [address] [phone] [facility]  [place] [address] [phone] [facility]  [address] [place] [address] [place]  [address] [place] [address] [phone] [facility]  [address]  [place] [address] [phone]  [facility]  [address] [place] [address] [place] [address] [phone]   Situation ongoing,  CSW will follow up.   Benjaman Kindler, MSW, [facility] 09/29/2021  @ 4:58 PM

## 2021-09-29 NOTE — Progress Notes (Signed)
Pt was accepted to [facility] 09/30/21; Bed Assignment [place] Acute Unit  Pt meets inpatient criteria per Lindon Romp, NP  Attending Physician will be Dr. Gerrit Halls  Report can be called to: [phone]  Pt can arrive between 9am-9pm  Care Team notified: Tora Kindred, RN, Lindon Romp, NP  Nadara Mode, LCSWA 09/29/2021 @ 7:47 PM   [phone] Dr. Gerrit Halls Tomorrow 9am 9pm [facility] Acute [place] unit

## 2021-09-30 DIAGNOSIS — F1120 Opioid dependence, uncomplicated: Secondary | ICD-10-CM

## 2021-09-30 DIAGNOSIS — F4310 Post-traumatic stress disorder, unspecified: Secondary | ICD-10-CM

## 2021-09-30 MED ORDER — LORAZEPAM 2 MG/ML IJ SOLN
2.0000 mg | Freq: Once | INTRAMUSCULAR | Status: AC
  Administered 2021-09-30: 2 mg via INTRAMUSCULAR
  Filled 2021-09-30: qty 1

## 2021-09-30 MED ORDER — HALOPERIDOL LACTATE 5 MG/ML IJ SOLN: 5.0000 mg | Freq: Once | INTRAMUSCULAR | Status: DC

## 2021-09-30 MED ORDER — HALOPERIDOL LACTATE 5 MG/ML IJ SOLN
5.0000 mg | Freq: Once | INTRAMUSCULAR | Status: AC
  Administered 2021-09-30: 5 mg via INTRAMUSCULAR
  Filled 2021-09-30: qty 1

## 2021-09-30 MED ORDER — LORAZEPAM 2 MG/ML IJ SOLN: 2.0000 mg | Freq: Once | INTRAMUSCULAR | Status: DC

## 2021-09-30 NOTE — ED Provider Notes (Signed)
Emergency Medicine Observation Re-evaluation Note  Lucas White is a 61 y.o. male, seen on rounds today.  Pt initially presented to the ED for complaints of Constipation and IVC Currently, the patient is awaiting admission to behavioral health.  He has been manic and threatening to assault his mother  Physical Exam  BP 99/61 (BP Location: Left Arm)   Pulse 62   Temp 98.7 F (37.1 C) (Oral)   Resp 18   Ht 5\' 11"  (1.803 m)   Wt 90 kg   SpO2 99%   BMI 27.67 kg/m  Physical Exam Patient resting in no acute distress  ED Course / MDM  EKG:EKG Interpretation  Date/Time:  Wednesday September 28 2021 13:24:04 EDT Ventricular Rate:  93 PR Interval:  140 QRS Duration: 80 QT Interval:  343 QTC Calculation: 427 R Axis:   64 Text Interpretation: Sinus rhythm Low voltage, extremity and precordial leads Confirmed by Garnette Gunner [phone]) on 09/28/2021 1:33:53 PM  I have reviewed the labs performed to date as well as medications administered while in observation.  Recent changes in the last 24 hours include none.  Plan  Current plan is for psychiatric admission.    Milton Ferguson, MD 09/30/21 [phone]

## 2021-09-30 NOTE — ED Provider Notes (Signed)
Patient is medically cleared and awaiting behavioral health admission   Lucas Ferguson, MD 09/30/21 [phone]

## 2021-09-30 NOTE — ED Notes (Signed)
Patient was given his dinner tray.

## 2021-09-30 NOTE — Progress Notes (Signed)
[facility] ED Progress Note  09/30/2021 12:54 PM Lucas White  MRN:  [id number]   Principal Problem: Aggressive behavior, adult Diagnosis:  Principal Problem:   Aggressive behavior, adult Active Problems:   PTSD (post-traumatic stress disorder)   Opioid use disorder, severe, on maintenance therapy, dependence [facility])   ED Assessment Time Calculation: Start Time: 1215 Stop Time: 1230 Total Time in Minutes (Assessment Completion): 15  Subjective:   Lucas White is a 61 y.o. male patient  with a history of PTSD, alcohol use disorder, cocaine use disorder, opioid use disorder, paranoia, and confusion  who presented [facility]21 accompanied by his mother via ambulance due to concerns for her well-being and the safety of others.    HPI:  On evaluation patient is alert. He is disoriented to place and situation. He is calm and cooperative. Speech is clear and coherent. Mood is depressed/anxious and affect is congruent with mood. Thought process is disorganized and irrelevant at times. Thought content is delusional related to his family trying to put him away so they can get his money. He perseverates on no one liking his mother and her her reportedly taking his money. Denies auditory and visual hallucinations. No indication that patient is responding to internal stimuli. No evidence of delusional thought content. Denies suicidal ideations. Denies homicidal ideations. Reports sleep is fair and appetite is good.   Per nursing notes, the patient was responding to internal stimuli last night. Haldol 5 mg was ordered by the EDP.   Patient was evaluated at [facility] on 08/04/2021 and transferred to [facility] for inpatient psychiatric treatment. Patient does not recall the names of his medications.    On chart review, it is noted that the patient threatened to harm the ED provider. Nursing staff reported that the patient was placing his hands under the hand sanitizer and then placing the hand  sanitizer in his mouth when he arrived to the ED.   Patient's mother, Lucas White [phone], returned call this morning.  Patient's mother reports that the patient has been disoriented, seeing and hearing things that are not there, and aggressive.  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  She states that he no longer drinks alcohol.  She states that while he was [place] no one called the family, so she is not sure if he was started on any medications.   Past Psychiatric History: PTSD, alcohol use disorder, cocaine use disorder, opioid use disorder  [place] Scale:  Flowsheet Row ED from 09/28/2021 in [facility] from 03/23/2021 in [facility] ED from 10/19/2020 in [facility]  C-SSRS RISK CATEGORY No Risk No Risk No Risk       Past Medical History:  Past Medical History:  Diagnosis Date   Chronic back pain greater than 3 months duration    Obesity (BMI 30.0-34.9)    Pneumonia due to COVID-19 virus 12/19/2018   Type 2 diabetes mellitus (HCC)     Past Surgical History:  Procedure Laterality Date   BACK SURGERY     COLONOSCOPY     Family History:  Family History  Problem Relation Age of Onset   Diabetes Father     Social History:  Social History   Substance and Sexual Activity  Alcohol Use No     Social History   Substance and Sexual Activity  Drug Use Never    Social  History   Socioeconomic History   Marital status: Married    Spouse name: Not on file   Number of children: Not on file   Years of education: Not on file   Highest education level: Not on file  Occupational History   Not on file  Tobacco Use   Smoking status: Never   Smokeless tobacco: Never  Vaping Use   Vaping Use: Never used  Substance and Sexual Activity   Alcohol use: No   Drug use: Never   Sexual activity: Not on file  Other  Topics Concern   Not on file  Social History Narrative   Not on file   Social Determinants of Health   Financial Resource Strain: Not on file  Food Insecurity: Not on file  Transportation Needs: Not on file  Physical Activity: Not on file  Stress: Not on file  Social Connections: Not on file    Sleep: Fair  Appetite:  Good  Current Medications: Current Facility-Administered Medications  Medication Dose Route Frequency Provider Last Rate Last Admin   amoxicillin-clavulanate (AUGMENTIN) 875-125 MG per tablet 1 tablet  1 tablet Oral Q12H Small, Brooke L, PA   1 tablet at 09/30/21 1152   diltiazem (CARDIZEM CD) 24 hr capsule 180 mg  180 mg Oral Daily Small, Brooke L, PA   180 mg at 09/30/21 1154   escitalopram (LEXAPRO) tablet 20 mg  20 mg Oral Daily Small, Brooke L, PA   20 mg at 09/30/21 1152   gabapentin (NEURONTIN) capsule 600 mg  600 mg Oral QID Small, Brooke L, PA   600 mg at 09/30/21 1151   glipiZIDE (GLUCOTROL) tablet 10 mg  10 mg Oral QAC breakfast Small, Brooke L, PA   10 mg at 09/30/21 1154   glucose chewable tablet 64 g  16 tablet Oral PRN Small, Brooke L, PA       melatonin tablet 6 mg  6 mg Oral QHS Small, Brooke L, PA   6 mg at 09/29/21 2146   metFORMIN (GLUCOPHAGE-XR) 24 hr tablet 500 mg  500 mg Oral BID WC Small, Brooke L, PA   500 mg at 09/30/21 1153   methadone (DOLOPHINE) 10 MG/ML solution 60 mg  60 mg Oral Daily Kingsley, Victoria K, DO   60 mg at 09/30/21 1154   naproxen (NAPROSYN) tablet 500 mg  500 mg Oral BID WC Small, Brooke L, PA   500 mg at 09/30/21 1152   OLANZapine (ZYPREXA) tablet 5 mg  5 mg Oral QHS Nira Conn A, NP   5 mg at 09/29/21 2147   pravastatin (PRAVACHOL) tablet 40 mg  40 mg Oral Daily Small, Brooke L, PA   40 mg at 09/30/21 1151   thiamine (VITAMIN B1) tablet 100 mg  100 mg Oral Daily Nira Conn A, NP   100 mg at 09/30/21 1152   Or   thiamine (VITAMIN B1) injection 100 mg  100 mg Intravenous Daily Nira Conn A, NP       Current  Outpatient Medications  Medication Sig Dispense Refill   Cholecalciferol (VITAMIN D3) 25 MCG (1000 UT) CAPS Take 1,000 Units by mouth daily.     diltiazem (TIAZAC) 180 MG 24 hr capsule Take 180 mg by mouth daily.     escitalopram (LEXAPRO) 20 MG tablet Take 20 mg by mouth daily.     gabapentin (NEURONTIN) 300 MG capsule Take 600 mg by mouth 4 (four) times daily.     glipiZIDE (GLUCOTROL) 10 MG tablet  Take 10 mg by mouth daily before breakfast.     glucose 4 GM chewable tablet Chew 4 tablets by mouth as needed for low blood sugar (BGL less than 70).     insulin glargine-yfgn (SEMGLEE, YFGN,) 100 UNIT/ML Pen Inject 20 Units into the skin at bedtime.     melatonin 3 MG TABS tablet Take 6 mg by mouth at bedtime.     metFORMIN (GLUCOPHAGE-XR) 500 MG 24 hr tablet Take 500 mg by mouth 2 (two) times daily with a meal.     methadone (DOLOPHINE) 10 MG/ML solution Take 60 mg by mouth in the morning.     naproxen (NAPROSYN) 500 MG tablet Take 500 mg by mouth in the morning and at bedtime.     pravastatin (PRAVACHOL) 40 MG tablet Take 40 mg by mouth at bedtime.     methadone (DOLOPHINE) 10 MG tablet Take 50 mg by mouth daily. Methadone clinic - [facility] [phone] ([place]), pt receives Methadone 50mg  (liquid) daily (Patient not taking: Reported on 09/28/2021)      Lab Results:  Results for orders placed or performed during the hospital encounter of 09/28/21 (from the past 48 hour(s))  Resp Panel by RT-PCR (Flu A&B, Covid) Anterior Nasal Swab     Status: None   Collection Time: 09/28/21  1:19 PM   Specimen: Anterior Nasal Swab  Result Value Ref Range   SARS Coronavirus 2 by RT PCR NEGATIVE NEGATIVE    Comment: (NOTE) SARS-CoV-2 target nucleic acids are NOT DETECTED.  The SARS-CoV-2 RNA is generally detectable in upper respiratory specimens during the acute phase of infection. The lowest concentration of SARS-CoV-2 viral copies this assay can detect is 138 copies/mL. A negative result does not  preclude SARS-Cov-2 infection and should not be used as the sole basis for treatment or other patient management decisions. A negative result may occur with  improper specimen collection/handling, submission of specimen other than nasopharyngeal swab, presence of viral mutation(s) within the areas targeted by this assay, and inadequate number of viral copies(<138 copies/mL). A negative result must be combined with clinical observations, patient history, and epidemiological information. The expected result is Negative.  Fact Sheet for Patients:  EntrepreneurPulse.com.au  Fact Sheet for Healthcare Providers:  IncredibleEmployment.be  This test is no t yet approved or cleared by the [place] FDA and  has been authorized for detection and/or diagnosis of SARS-CoV-2 by FDA under an Emergency Use Authorization (EUA). This EUA will remain  in effect (meaning this test can be used) for the duration of the COVID-19 declaration under Section 564(b)(1) of the Act, 21 U.S.C.section 360bbb-3(b)(1), unless the authorization is terminated  or revoked sooner.       Influenza A by PCR NEGATIVE NEGATIVE   Influenza B by PCR NEGATIVE NEGATIVE    Comment: (NOTE) The Xpert Xpress SARS-CoV-2/FLU/RSV plus assay is intended as an aid in the diagnosis of influenza from Nasopharyngeal swab specimens and should not be used as a sole basis for treatment. Nasal washings and aspirates are unacceptable for Xpert Xpress SARS-CoV-2/FLU/RSV testing.  Fact Sheet for Patients: EntrepreneurPulse.com.au  Fact Sheet for Healthcare Providers: IncredibleEmployment.be  This test is not yet approved or cleared by the [place] FDA and has been authorized for detection and/or diagnosis of SARS-CoV-2 by FDA under an Emergency Use Authorization (EUA). This EUA will remain in effect (meaning this test can be used) for the duration of  the COVID-19 declaration under Section 564(b)(1) of the Act, 21 U.S.C. section 360bbb-3(b)(1), unless  the authorization is terminated or revoked.  Performed at [facility], [address] [place], [place] [address]   Comprehensive metabolic panel     Status: Abnormal   Collection Time: 09/28/21  1:19 PM  Result Value Ref Range   Sodium 138 135 - 145 mmol/L   Potassium 4.7 3.5 - 5.1 mmol/L   Chloride 101 98 - 111 mmol/L   CO2 30 22 - 32 mmol/L   Glucose, Bld 154 (H) 70 - 99 mg/dL    Comment: Glucose reference range applies only to samples taken after fasting for at least 8 hours.   BUN 17 6 - 20 mg/dL   Creatinine, Ser 4.01 0.61 - 1.24 mg/dL   Calcium 9.6 8.9 - 02.7 mg/dL   Total Protein 7.8 6.5 - 8.1 g/dL   Albumin 4.6 3.5 - 5.0 g/dL   AST 40 15 - 41 U/L   ALT 18 0 - 44 U/L   Alkaline Phosphatase 71 38 - 126 U/L   Total Bilirubin 1.0 0.3 - 1.2 mg/dL   GFR, Estimated >25 >36 mL/min    Comment: (NOTE) Calculated using the CKD-EPI Creatinine Equation (2021)    Anion gap 7 5 - 15    Comment: Performed at [facility], [address] [place], [place] [address]  Ethanol     Status: None   Collection Time: 09/28/21  1:19 PM  Result Value Ref Range   Alcohol, Ethyl (B) <10 <10 mg/dL    Comment: (NOTE) Lowest detectable limit for serum alcohol is 10 mg/dL.  For medical purposes only. Performed at [facility], [address], [place], [place] [address]   Urine rapid drug screen (hosp performed)     Status: None   Collection Time: 09/28/21  1:19 PM  Result Value Ref Range   Opiates NONE DETECTED NONE DETECTED   Cocaine NONE DETECTED NONE DETECTED   Benzodiazepines NONE DETECTED NONE DETECTED   Amphetamines NONE DETECTED NONE DETECTED   Tetrahydrocannabinol NONE DETECTED NONE DETECTED   Barbiturates NONE DETECTED NONE DETECTED    Comment: (NOTE) DRUG SCREEN FOR MEDICAL PURPOSES ONLY.  IF CONFIRMATION IS NEEDED FOR  ANY PURPOSE, NOTIFY LAB WITHIN 5 DAYS.  LOWEST DETECTABLE LIMITS FOR URINE DRUG SCREEN Drug Class                     Cutoff (ng/mL) Amphetamine and metabolites    1000 Barbiturate and metabolites    200 Benzodiazepine                 200 Tricyclics and metabolites     300 Opiates and metabolites        300 Cocaine and metabolites        300 THC                            50 Performed at [facility], [address], [place], [place] [address]   CBC with Diff     Status: Abnormal   Collection Time: 09/28/21  1:19 PM  Result Value Ref Range   WBC 15.5 (H) 4.0 - 10.5 K/uL   RBC 3.65 (L) 4.22 - 5.81 MIL/uL   Hemoglobin 10.9 (L) 13.0 - 17.0 g/dL   HCT 75.6 (L) 43.3 - 29.5 %   MCV 92.1 80.0 - 100.0 fL   MCH 29.9 26.0 - 34.0 pg   MCHC 32.4 30.0 - 36.0  g/dL   RDW 25.4 27.0 - 62.3 %   Platelets 385 150 - 400 K/uL   nRBC 0.0 0.0 - 0.2 %   Neutrophils Relative % 79 %   Neutro Abs 12.1 (H) 1.7 - 7.7 K/uL   Lymphocytes Relative 15 %   Lymphs Abs 2.3 0.7 - 4.0 K/uL   Monocytes Relative 6 %   Monocytes Absolute 1.0 0.1 - 1.0 K/uL   Eosinophils Relative 0 %   Eosinophils Absolute 0.0 0.0 - 0.5 K/uL   Basophils Relative 0 %   Basophils Absolute 0.0 0.0 - 0.1 K/uL   Immature Granulocytes 0 %   Abs Immature Granulocytes 0.04 0.00 - 0.07 K/uL    Comment: Performed at [facility], [address] [place], [place] [address]  Urinalysis, Routine w reflex microscopic     Status: Abnormal   Collection Time: 09/28/21  1:19 PM  Result Value Ref Range   Color, Urine YELLOW YELLOW   APPearance CLEAR CLEAR   Specific Gravity, Urine 1.017 1.005 - 1.030   pH 6.0 5.0 - 8.0   Glucose, UA NEGATIVE NEGATIVE mg/dL   Hgb urine dipstick NEGATIVE NEGATIVE   Bilirubin Urine NEGATIVE NEGATIVE   Ketones, ur NEGATIVE NEGATIVE mg/dL   Protein, ur NEGATIVE NEGATIVE mg/dL   Nitrite NEGATIVE NEGATIVE   Leukocytes,Ua TRACE (A) NEGATIVE   RBC / HPF 0-5 0 - 5 RBC/hpf    WBC, UA 0-5 0 - 5 WBC/hpf   Bacteria, UA RARE (A) NONE SEEN   Squamous Epithelial / LPF 0-5 0 - 5   Mucus PRESENT    Hyaline Casts, UA PRESENT     Comment: Performed at [facility], [address] [place], [place] [address]  CBG monitoring, ED     Status: None   Collection Time: 09/28/21 10:43 PM  Result Value Ref Range   Glucose-Capillary 80 70 - 99 mg/dL    Comment: Glucose reference range applies only to samples taken after fasting for at least 8 hours.    Blood Alcohol level:  Lab Results  Component Value Date   ETH <10 09/28/2021   ETH <10 08/04/2021    Physical Findings:  CIWA:  CIWA-Ar Total: 0 COWS:     Musculoskeletal: Strength & Muscle Tone: within normal limits Gait & Station: normal Patient leans: N/A  Psychiatric Specialty Exam:  Presentation  General Appearance:  Casual  Eye Contact: Fair  Speech: Clear and Coherent; Normal Rate  Speech Volume: Normal  Handedness: Right   Mood and Affect  Mood: Depressed; Anxious  Affect: Congruent   Thought Process  Thought Processes: Disorganized; Irrevelant  Descriptions of Associations:Loose  Orientation:Partial  Thought Content:Perseveration  History of Schizophrenia/Schizoaffective disorder:No  Duration of Psychotic Symptoms:Greater than six months  Hallucinations:Hallucinations: None  Ideas of Reference:Delusions  Suicidal Thoughts:Suicidal Thoughts: No  Homicidal Thoughts:Homicidal Thoughts: No   Sensorium  Memory: Immediate Fair; Recent Fair; Remote Fair  Judgment: Impaired  Insight: Lacking   Executive Functions  Concentration: Fair  Attention Span: Fair  Recall: Poor  Fund of Knowledge: Fair  Language: Good   Psychomotor Activity  Psychomotor Activity: Psychomotor Activity: Normal   Assets  Assets: Financial Resources/Insurance; Social Support; Physical Health   Sleep  Sleep: Sleep: Fair    Physical Exam: Physical  Exam Constitutional:      General: He is not in acute distress.    Appearance: He is not ill-appearing, toxic-appearing or diaphoretic.  Eyes:     General:  Right eye: No discharge.        Left eye: No discharge.  Cardiovascular:     Rate and Rhythm: Normal rate.  Musculoskeletal:        General: Normal range of motion.     Cervical back: Normal range of motion.  Neurological:     Mental Status: He is alert.  Psychiatric:        Mood and Affect: Mood is anxious and depressed.        Behavior: Behavior is cooperative.        Thought Content: Thought content is delusional. Thought content does not include homicidal or suicidal ideation.    Review of Systems  Respiratory:  Negative for cough and shortness of breath.   Cardiovascular:  Negative for chest pain.  Gastrointestinal:  Negative for diarrhea, nausea and vomiting.  Psychiatric/Behavioral:  Positive for depression and memory loss. Negative for substance abuse and suicidal ideas. The patient is nervous/anxious and has insomnia.    Blood pressure 96/78, pulse 63, temperature 98.7 F (37.1 C), temperature source Oral, resp. rate 17, height 5\' 11"  (1.803 m), weight 90 kg, SpO2 99 %. Body mass index is 27.67 kg/m.   Medical Decision Making: Lucas White is a 61 y.o. male patient  with a history of PTSD, alcohol use disorder, cocaine use disorder, opioid use disorder, paranoia, and confusion  who [facility] accompanied by his mother via ambulance due to concerns for her well-being and the safety of others.   Per nursing notes, the patient was responding to internal stimuli last night. Haldol 5 mg was ordered by the EDP.  Continue lexapro 20 mg daily for depression/anxiety   Continue zyprexa 5 mg QHS for paranoia and aggressive behaviors    Problem 1: Aggressive Behavior   Problem 2: Paranoia   Problem 3: PTSD   Disposition: Recommend psychiatric Inpatient admission when medically  cleared.  Lucas PolingJason A Amorah Sebring, NP 09/30/2021, 12:54 PM

## 2021-09-30 NOTE — ED Notes (Addendum)
Patient has been alert this shift. Patient has slept during shift.  Patient confused and disorganized, rambling at times.  Irritable and restless this PM.

## 2021-09-30 NOTE — Progress Notes (Signed)
[facility] LCSW Progress Note   09/30/2021    10:07 PM  Lucas White   [id number]   Type of Contact and Topic:  Psychiatric Bed Placement   Pt accepted to [facility] Unit     Patient meets inpatient criteria per Lindon Romp, NP  The attending provider will be Dr. Franchot Mimes  Call report to [phone]  Tora Kindred, RN @ [facility] ED notified.     Pt scheduled  to arrive at [place] 0900.    Mariea Clonts, MSW, LCSW-A  10:21 PM 09/30/2021

## 2021-09-30 NOTE — ED Notes (Signed)
Patient is confused and trying to crawl under the bed. He is asking staff if they are [place] with his wife. He is wondering around his room looking for keys. MD notified and gave new orders.

## 2021-09-30 NOTE — Progress Notes (Signed)
CSW informed [facility] about the patient's methadone usage, the facility has accepted him regardless of this previous barrier. Pt is set to go to the facility tomorrow 9/30 after 0900.    Mariea Clonts, MSW, LCSW-A  10:25 PM 09/30/2021

## 2021-09-30 NOTE — ED Notes (Signed)
Patient came to the door and asked where the restroom was. His room light was cut on and patient was shown to the restroom.

## 2021-10-01 MED ORDER — METHADONE HCL 10 MG PO TABS
60.0000 mg | ORAL_TABLET | Freq: Every day | ORAL | Status: DC
  Administered 2021-10-01 – 2021-10-02 (×2): 60 mg via ORAL
  Filled 2021-10-01 (×2): qty 6

## 2021-10-01 MED ORDER — DOCUSATE SODIUM 100 MG PO CAPS
100.0000 mg | ORAL_CAPSULE | Freq: Two times a day (BID) | ORAL | Status: DC | PRN
  Administered 2021-10-01: 100 mg via ORAL
  Filled 2021-10-01: qty 1

## 2021-10-01 NOTE — ED Notes (Signed)
Spoke to [facility] [phone] for report. Asked to update when [employer] arrives.

## 2021-10-01 NOTE — ED Provider Notes (Signed)
Emergency Medicine Observation Re-evaluation Note  Lucas White is a 61 y.o. male, seen on rounds today.  Pt initially presented to the ED for complaints of Constipation and IVC Currently, the patient is sitting in chair.  Physical Exam  BP 95/63 (BP Location: Right Arm)   Pulse 82   Temp 97.8 F (36.6 C) (Oral)   Resp 16   Ht 5\' 11"  (1.803 m)   Wt 90 kg   SpO2 98%   BMI 27.67 kg/m  Physical Exam General: Awake, alert, nondistressed Cardiac: Extremities well perfused Lungs: Breathing is unlabored Psych: No agitation, not responding to internal stimuli  ED Course / MDM  EKG:EKG Interpretation  Date/Time:  Wednesday September 28 2021 13:24:04 EDT Ventricular Rate:  93 PR Interval:  140 QRS Duration: 80 QT Interval:  343 QTC Calculation: 427 R Axis:   64 Text Interpretation: Sinus rhythm Low voltage, extremity and precordial leads Confirmed by Garnette Gunner [phone]) on 09/28/2021 1:33:53 PM  I have reviewed the labs performed to date as well as medications administered while in observation.  Recent changes in the last 24 hours include accepted to [facility].  Plan  Current plan is for transfer to [facility] for inpatient psychiatric admission.    Godfrey Pick, MD 10/01/21 [phone]

## 2021-10-01 NOTE — ED Notes (Signed)
Pt has been accepted to [facility] unit Reporting number is [phone]. [facility] Tx to pick up after 0800 tomorrow.

## 2021-10-01 NOTE — ED Notes (Addendum)
Attempted to call report to [facility] [place] unit to no answer. Will follow up.

## 2021-10-01 NOTE — ED Notes (Signed)
Left message again for IVC transport at [phone]

## 2021-10-01 NOTE — ED Notes (Signed)
Left third message for [facility] transport @ [phone]

## 2021-10-01 NOTE — ED Notes (Signed)
[place] office responded and informed they will be able to Tx Pt tomorrow after 8am

## 2021-10-01 NOTE — ED Notes (Signed)
Left second message for [place], [place], for IVC transport of this Pt to [facility].

## 2021-10-01 NOTE — ED Notes (Addendum)
Sheriff transport contacted, left message for IVC transport to [facility]

## 2021-10-02 DIAGNOSIS — R4689 Other symptoms and signs involving appearance and behavior: Secondary | ICD-10-CM

## 2021-10-02 NOTE — ED Notes (Signed)
Spoke w/Officer Tyner w/Sheriff Dept regarding transport.  She has a transport prior to getting this pt.  Pt will be picked up in the afternoon and to call her if anything changes, [phone].

## 2021-10-02 NOTE — ED Provider Notes (Signed)
  Physical Exam  BP 119/71 (BP Location: Right Arm)   Pulse 75   Temp 98.2 F (36.8 C) (Oral)   Resp 18   Ht 5\' 11"  (1.803 m)   Wt 90 kg   SpO2 96%   BMI 27.67 kg/m   Physical Exam  Procedures  Procedures  ED Course / MDM    MDM Pending transfer to [facility].  However excepting doctor reportedly cannot be given until transport services here.  At this time however appears stable for transfer.       Davonna Belling, MD 10/02/21 1423

## 2021-10-02 NOTE — ED Notes (Signed)
Pt had shower and lunch tray given

## 2021-10-02 NOTE — Progress Notes (Signed)
CSW spoke with Bessie at [facility]. She advised that the IVC paperwork is the only paperwork that needs to be faxed to them before calling report. Fax number [phone]. CSW spoke with treatment and informed them of communications with [place], MSW, [place], LCAS Phone: [phone] Disposition/TOC

## 2021-10-02 NOTE — Progress Notes (Signed)
CSW spoke with [facility] in reference to this patient being accepted for yesterday. It was advised that the order for acceptance was only for 24 hours. CSW informed Peter Congo that it has been a delay due to available transportation of the patient. Peter Congo reported she will communicate to the unit and have the order extended.   Glennie Isle, MSW, [facility] Phone: [phone] Disposition/TOC

## 2021-10-02 NOTE — BH Assessment (Addendum)
Received a call from RN at [facility] stating Pt has been accepted to [facility] and was supposed to be transported on 10/01/2021 but there was a problem with [facility]. The RN said when she called to give report she was told that there was a problem with accepting Pt. TTS called [facility] at [phone] and the RN said she was not certain what the problems was and transferred to charge RN. TTS spoke with the charge RN and she said that the Pt's admission orders had expired and needed to be renewed. I asked how that can be arranged and she said she was not certain, that it would probably be through the intake department. She provided numbers [phone] or [phone]. Attempted to contact the intake department at [facility] at those numbers but they are fax numbers. Unable to contact their intake department. TOC will need to follow up.   Evelena Peat, [facility], [facility] Triage Specialist [phone]

## 2021-10-02 NOTE — ED Notes (Signed)
Pt asked for shower once set up he refused.

## 2021-10-02 NOTE — ED Notes (Signed)
Breakfast tray given. °

## 2021-10-02 NOTE — Progress Notes (Signed)
[facility] Psych ED Progress Note  10/02/2021 1:15 PM Lucas White  MRN:  [id number]   Principal Problem: Aggressive behavior, adult Diagnosis:  Principal Problem:   Aggressive behavior, adult Active Problems:   PTSD (post-traumatic stress disorder)   Opioid use disorder, severe, on maintenance therapy, dependence [facility])   ED Assessment Time Calculation: No data recorded  Subjective:   Lucas White is a 61 y.o. male patient  with a history of PTSD, alcohol use disorder, cocaine use disorder, opioid use disorder, paranoia, and confusion  who presented to [facility] on 09/28/21 accompanied by his mother via ambulance due to concerns for her well-being and the safety of others.    HPI:  On evaluation patient is alert but confused.  He also reported that  He remains depressed and rated depression 9/10 .  He is occasionally disorganized with flight of ideas.  No AVH documented in the last 12 hours other than occasional confusion, restless and irritable.  He continues to take his medications and eats well.  Sheriff has agreed to transport patient to continue treatment at [facility] this afternoon.  We will continue to monitor and address patient's need  while waiting for transportation.  Past Psychiatric History: PTSD, alcohol use disorder, cocaine use disorder, opioid use disorder  [place] Scale:  Flowsheet Row ED from 09/28/2021 in [facility] ED from 03/23/2021 in [facility] EMERGENCY DEPARTMENT ED from 10/19/2020 in [facility]  C-SSRS RISK CATEGORY No Risk No Risk No Risk       Past Medical History:  Past Medical History:  Diagnosis Date   Chronic back pain greater than 3 months duration    Obesity (BMI 30.0-34.9)    Pneumonia due to COVID-19 virus 12/19/2018   Type 2 diabetes mellitus (HCC)     Past Surgical History:  Procedure Laterality Date   BACK SURGERY     COLONOSCOPY     Family  History:  Family History  Problem Relation Age of Onset   Diabetes Father     Social History:  Social History   Substance and Sexual Activity  Alcohol Use No     Social History   Substance and Sexual Activity  Drug Use Never    Social History   Socioeconomic History   Marital status: Married    Spouse name: Not on file   Number of children: Not on file   Years of education: Not on file   Highest education level: Not on file  Occupational History   Not on file  Tobacco Use   Smoking status: Never   Smokeless tobacco: Never  Vaping Use   Vaping Use: Never used  Substance and Sexual Activity   Alcohol use: No   Drug use: Never   Sexual activity: Not on file  Other Topics Concern   Not on file  Social History Narrative   Not on file   Social Determinants of Health   Financial Resource Strain: Not on file  Food Insecurity: Not on file  Transportation Needs: Not on file  Physical Activity: Not on file  Stress: Not on file  Social Connections: Not on file    Sleep: Fair  Appetite:  Good  Current Medications: Current Facility-Administered Medications  Medication Dose Route Frequency Provider Last Rate Last Admin   amoxicillin-clavulanate (AUGMENTIN) 875-125 MG per tablet 1 tablet  1 tablet Oral Q12H Small, Brooke L, PA   1 tablet at 10/02/21 1048  diltiazem (CARDIZEM CD) 24 hr capsule 180 mg  180 mg Oral Daily Small, Brooke L, PA   180 mg at 10/02/21 1048   docusate sodium (COLACE) capsule 100 mg  100 mg Oral BID PRN Valarie Merino, MD   100 mg at 10/01/21 2133   escitalopram (LEXAPRO) tablet 20 mg  20 mg Oral Daily Small, Brooke L, PA   20 mg at 10/02/21 1047   gabapentin (NEURONTIN) capsule 600 mg  600 mg Oral QID Small, Brooke L, PA   600 mg at 10/02/21 1047   glipiZIDE (GLUCOTROL) tablet 10 mg  10 mg Oral QAC breakfast Small, Brooke L, PA   10 mg at 10/02/21 0809   glucose chewable tablet 64 g  16 tablet Oral PRN Small, Brooke L, PA       haloperidol  lactate (HALDOL) injection 5 mg  5 mg Intramuscular Once Hayden Rasmussen, MD       melatonin tablet 6 mg  6 mg Oral QHS Small, Brooke L, PA   6 mg at 10/01/21 2132   metFORMIN (GLUCOPHAGE-XR) 24 hr tablet 500 mg  500 mg Oral BID WC Small, Brooke L, PA   500 mg at 10/02/21 0809   methadone (DOLOPHINE) tablet 60 mg  60 mg Oral Daily Tawnya Crook, RPH   60 mg at 10/02/21 1055   naproxen (NAPROSYN) tablet 500 mg  500 mg Oral BID WC Small, Brooke L, PA   500 mg at 10/02/21 0809   OLANZapine (ZYPREXA) tablet 5 mg  5 mg Oral QHS Lindon Romp A, NP   5 mg at 10/01/21 2133   pravastatin (PRAVACHOL) tablet 40 mg  40 mg Oral Daily Small, Brooke L, PA   40 mg at 10/02/21 1047   thiamine (VITAMIN B1) tablet 100 mg  100 mg Oral Daily Lindon Romp A, NP   100 mg at 10/02/21 1047   Or   thiamine (VITAMIN B1) injection 100 mg  100 mg Intravenous Daily Lindon Romp A, NP       Current Outpatient Medications  Medication Sig Dispense Refill   Cholecalciferol (VITAMIN D3) 25 MCG (1000 UT) CAPS Take 1,000 Units by mouth daily.     diltiazem (TIAZAC) 180 MG 24 hr capsule Take 180 mg by mouth daily.     escitalopram (LEXAPRO) 20 MG tablet Take 20 mg by mouth daily.     gabapentin (NEURONTIN) 300 MG capsule Take 600 mg by mouth 4 (four) times daily.     glipiZIDE (GLUCOTROL) 10 MG tablet Take 10 mg by mouth daily before breakfast.     glucose 4 GM chewable tablet Chew 4 tablets by mouth as needed for low blood sugar (BGL less than 70).     insulin glargine-yfgn (SEMGLEE, YFGN,) 100 UNIT/ML Pen Inject 20 Units into the skin at bedtime.     melatonin 3 MG TABS tablet Take 6 mg by mouth at bedtime.     metFORMIN (GLUCOPHAGE-XR) 500 MG 24 hr tablet Take 500 mg by mouth 2 (two) times daily with a meal.     methadone (DOLOPHINE) 10 MG/ML solution Take 60 mg by mouth in the morning.     naproxen (NAPROSYN) 500 MG tablet Take 500 mg by mouth in the morning and at bedtime.     pravastatin (PRAVACHOL) 40 MG tablet Take  40 mg by mouth at bedtime.     methadone (DOLOPHINE) 10 MG tablet Take 50 mg by mouth daily. Methadone clinic - [place] [phone] (  [place]), pt receives Methadone 50mg  (liquid) daily (Patient not taking: Reported on 09/28/2021)      Lab Results:  No results found for this or any previous visit (from the past 48 hour(s)).   Blood Alcohol level:  Lab Results  Component Value Date   ETH <10 09/28/2021   ETH <10 08/04/2021    Physical Findings:  CIWA:  CIWA-Ar Total: 0 COWS:     Musculoskeletal: Strength & Muscle Tone: within normal limits Gait & Station: normal Patient leans: N/A  Psychiatric Specialty Exam:  Presentation  General Appearance:  Casual  Eye Contact: Minimal  Speech: Normal Rate  Speech Volume: Normal  Handedness: Right   Mood and Affect  Mood: Depressed; Irritable; Anxious  Affect: Congruent; Depressed   Thought Process  Thought Processes: Disorganized  Descriptions of Associations:Loose  Orientation:Partial  Thought Content:Illogical; Perseveration  History of Schizophrenia/Schizoaffective disorder:No  Duration of Psychotic Symptoms:Greater than six months  Hallucinations:Hallucinations: None   Ideas of Reference:None  Suicidal Thoughts:Suicidal Thoughts: No   Homicidal Thoughts:Homicidal Thoughts: No    Sensorium  Memory: Immediate Fair; Recent Fair; Remote Fair  Judgment: Impaired  Insight: Lacking   Executive Functions  Concentration: Fair  Attention Span: Fair  Recall: Poor  Fund of Knowledge: Fair  Language: Good   Psychomotor Activity  Psychomotor Activity: Psychomotor Activity: Normal    Assets  Assets: Financial Resources/Insurance; Physical Health; Desire for Improvement   Sleep  Sleep: Sleep: Fair     Physical Exam: Physical Exam Constitutional:      General: He is not in acute distress.    Appearance: He is not ill-appearing, toxic-appearing or diaphoretic.  Eyes:      General:        Right eye: No discharge.        Left eye: No discharge.  Cardiovascular:     Rate and Rhythm: Normal rate.  Musculoskeletal:        General: Normal range of motion.     Cervical back: Normal range of motion.  Neurological:     Mental Status: He is alert.  Psychiatric:        Mood and Affect: Mood is anxious and depressed.        Behavior: Behavior is cooperative.        Thought Content: Thought content is delusional. Thought content does not include homicidal or suicidal ideation.    Review of Systems  Respiratory:  Negative for cough and shortness of breath.   Cardiovascular:  Negative for chest pain.  Gastrointestinal:  Negative for diarrhea, nausea and vomiting.  Psychiatric/Behavioral:  Positive for depression and memory loss. Negative for substance abuse and suicidal ideas. The patient is nervous/anxious and has insomnia.    Blood pressure 119/71, pulse 75, temperature 98.2 F (36.8 C), temperature source Oral, resp. rate 18, height 5\' 11"  (1.803 m), weight 90 kg, SpO2 96 %. Body mass index is 27.67 kg/m.   Medical Decision Making: Lucas White is a 61 y.o. male patient  with a history of PTSD, alcohol use disorder, cocaine use disorder, opioid use disorder, paranoia, and confusion  who presented to [facility] on 09/28/21 accompanied by his mother via ambulance due to concerns for her well-being and the safety of others. He was accepted at [facility] yesterday but transportation was an issue.  Today he reported and rated depression 8/10 with 10 being severe depression.  He was irritable and mood is labile. Continue lexapro 20 mg daily for depression/anxiety   Continue zyprexa 5 mg  QHS for paranoia and aggressive behaviors   Problem 1: Aggressive Behavior   Problem 2: Paranoia   Problem 3: PTSD   Disposition: Inpatient Psychiatric hospitalization, accepted at [facility] yesterday but [facility] was unable to transport patient.  Admission staff in  touch with [facility] for admission today.  Delfin Gant, NP-PMHNP-BC 10/02/2021, 1:15 PM

## 2021-10-27 ENCOUNTER — Emergency Department (HOSPITAL_COMMUNITY): Payer: Medicare Other

## 2021-10-27 ENCOUNTER — Emergency Department (HOSPITAL_COMMUNITY)
Admission: EM | Admit: 2021-10-27 | Discharge: 2021-10-27 | Disposition: A | Discharge status: Home / Self Care | Payer: Medicare Other | Attending: Emergency Medicine | Admitting: Emergency Medicine

## 2021-10-27 DIAGNOSIS — R739 Hyperglycemia, unspecified: Secondary | ICD-10-CM

## 2021-10-27 DIAGNOSIS — R4182 Altered mental status, unspecified: Secondary | ICD-10-CM | POA: Insufficient documentation

## 2021-10-27 DIAGNOSIS — T40601A Poisoning by unspecified narcotics, accidental (unintentional), initial encounter: Secondary | ICD-10-CM

## 2021-10-27 DIAGNOSIS — T402X1A Poisoning by other opioids, accidental (unintentional), initial encounter: Secondary | ICD-10-CM | POA: Diagnosis present

## 2021-10-27 DIAGNOSIS — Z7984 Long term (current) use of oral hypoglycemic drugs: Secondary | ICD-10-CM | POA: Diagnosis not present

## 2021-10-27 DIAGNOSIS — Z794 Long term (current) use of insulin: Secondary | ICD-10-CM | POA: Diagnosis not present

## 2021-10-27 DIAGNOSIS — E1165 Type 2 diabetes mellitus with hyperglycemia: Secondary | ICD-10-CM | POA: Diagnosis not present

## 2021-10-27 LAB — RAPID URINE DRUG SCREEN, HOSP PERFORMED
Amphetamines: NOT DETECTED
Barbiturates: NOT DETECTED
Benzodiazepines: NOT DETECTED
Cocaine: NOT DETECTED
Opiates: NOT DETECTED
Tetrahydrocannabinol: NOT DETECTED

## 2021-10-27 LAB — COMPREHENSIVE METABOLIC PANEL
ALT: 24 U/L (ref 0–44)
AST: 21 U/L (ref 15–41)
Albumin: 4.2 g/dL (ref 3.5–5.0)
Alkaline Phosphatase: 97 U/L (ref 38–126)
Anion gap: 9 (ref 5–15)
BUN: 8 mg/dL (ref 6–20)
CO2: 27 mmol/L (ref 22–32)
Calcium: 9.3 mg/dL (ref 8.9–10.3)
Chloride: 102 mmol/L (ref 98–111)
Creatinine, Ser: 1.06 mg/dL (ref 0.61–1.24)
GFR, Estimated: >60 mL/min (ref >60)
Glucose, Bld: 351 mg/dL — ABNORMAL HIGH (ref 70–99)
Potassium: 4 mmol/L (ref 3.5–5.1)
Sodium: 138 mmol/L (ref 135–145)
Total Bilirubin: 0.5 mg/dL (ref 0.3–1.2)
Total Protein: 7.6 g/dL (ref 6.5–8.1)

## 2021-10-27 LAB — CBC WITH DIFFERENTIAL/PLATELET
Abs Immature Granulocytes: 0.01 10*3/uL (ref 0.00–0.07)
Basophils Absolute: 0.1 10*3/uL (ref 0.0–0.1)
Basophils Relative: 1 %
Eosinophils Absolute: 0.2 10*3/uL (ref 0.0–0.5)
Eosinophils Relative: 3 %
HCT: 34 % — ABNORMAL LOW (ref 39.0–52.0)
Hemoglobin: 11.2 g/dL — ABNORMAL LOW (ref 13.0–17.0)
Immature Granulocytes: 0 %
Lymphocytes Relative: 38 %
Lymphs Abs: 2.4 10*3/uL (ref 0.7–4.0)
MCH: 29.7 pg (ref 26.0–34.0)
MCHC: 32.9 g/dL (ref 30.0–36.0)
MCV: 90.2 fL (ref 80.0–100.0)
Monocytes Absolute: 0.4 10*3/uL (ref 0.1–1.0)
Monocytes Relative: 7 %
Neutro Abs: 3.2 10*3/uL (ref 1.7–7.7)
Neutrophils Relative %: 51 %
Platelets: 413 10*3/uL — ABNORMAL HIGH (ref 150–400)
RBC: 3.77 MIL/uL — ABNORMAL LOW (ref 4.22–5.81)
RDW: 12 % (ref 11.5–15.5)
WBC: 6.3 10*3/uL (ref 4.0–10.5)
nRBC: 0 % (ref 0.0–0.2)

## 2021-10-27 LAB — URINALYSIS, ROUTINE W REFLEX MICROSCOPIC
Bacteria, UA: NONE SEEN
Bilirubin Urine: NEGATIVE
Glucose, UA: >=500 mg/dL — AB
Hgb urine dipstick: NEGATIVE
Ketones, ur: NEGATIVE mg/dL
Leukocytes,Ua: NEGATIVE
Nitrite: NEGATIVE
Protein, ur: NEGATIVE mg/dL
Specific Gravity, Urine: 1.021 (ref 1.005–1.030)
pH: 7 (ref 5.0–8.0)

## 2021-10-27 LAB — ACETAMINOPHEN LEVEL: Acetaminophen (Tylenol), Serum: <10 ug/mL — ABNORMAL LOW (ref 10–30)

## 2021-10-27 LAB — ETHANOL: Alcohol, Ethyl (B): <10 mg/dL (ref <10)

## 2021-10-27 LAB — CBG MONITORING, ED: Glucose-Capillary: 328 mg/dL — ABNORMAL HIGH (ref 70–99)

## 2021-10-27 LAB — SALICYLATE LEVEL: Salicylate Lvl: <7 mg/dL — ABNORMAL LOW (ref 7.0–30.0)

## 2021-10-27 MED ORDER — SODIUM CHLORIDE 0.9 % IV SOLN: INTRAVENOUS | Status: DC

## 2021-10-27 MED ORDER — LORAZEPAM 2 MG/ML IJ SOLN
INTRAMUSCULAR | Status: AC
  Administered 2021-10-27: 1 mg
  Filled 2021-10-27: qty 1

## 2021-10-27 MED ORDER — SODIUM CHLORIDE 0.9 % IV BOLUS
1000.0000 mL | Freq: Once | INTRAVENOUS | Status: AC
  Administered 2021-10-27: 1000 mL via INTRAVENOUS

## 2021-10-27 MED ORDER — NALOXONE HCL 2 MG/2ML IJ SOSY
PREFILLED_SYRINGE | INTRAMUSCULAR | Status: AC
  Administered 2021-10-27: 2 mg
  Filled 2021-10-27: qty 2

## 2021-10-27 MED ORDER — ONDANSETRON HCL 4 MG/2ML IJ SOLN
INTRAMUSCULAR | Status: AC
  Administered 2021-10-27: 4 mg
  Filled 2021-10-27: qty 2

## 2021-10-27 NOTE — ED Triage Notes (Signed)
Pt to er via ems, per ems pt has had some opiates and is now very sleepy, pt in bed with eyes closed, resps even and unlabored, pt arouses to verbal stim.  Pt then nodds off.  Md at bedside, per ems pt was found on side of [address] and reporting taking oxy.  Narcan given, pt had a positive response to narcan, pt now awake, pt c/o pain, pt yelling to stop the pain.

## 2021-10-27 NOTE — Inpatient Diabetes Management (Signed)
Inpatient Diabetes Program Recommendations  AACE/ADA: New Consensus Statement on Inpatient Glycemic Control (2015)  Target Ranges:  Prepandial:   less than 140 mg/dL      Peak postprandial:   less than 180 mg/dL (1-2 hours)      Critically ill patients:  140 - 180 mg/dL   Lab Results  Component Value Date   GLUCAP 328 (H) 10/27/2021   HGBA1C 7.6 (H) 08/04/2021    Review of Glycemic Control  Latest Reference Range & Units 10/27/21 11:27  Glucose-Capillary 70 - 99 mg/dL 328 (H)  (H): Data is abnormally high Diabetes history: type 2 Dm Outpatient Diabetes medications: Glipizide 10 mg QD, Metformin 500 mg BID, Semglee 20 units QHS Current orders for Inpatient glycemic control: none  Inpatient Diabetes Program Recommendations:    If to remain inpatient, consider: -Novolog 0-9 units TID & HS -Semglee 8 units QD -Metformin 500 mg BID -Glipizide 10 mg QD  Thanks, Bronson Curb, MSN, RNC-OB Diabetes Coordinator [phone] (8a-5p)

## 2021-10-27 NOTE — ED Provider Notes (Signed)
[place] DEPT Provider Note   CSN: [id number] Arrival date & time: 10/27/21  1048     History  Chief Complaint  Patient presents with   Drug Overdose    Lucas White is a 61 y.o. male.  Pt is a 61 yo male with a pmhx significant for DM2, chronic pain, PTSD, and polysubstance abuse.  Pt was found sitting on a side walk not acting right.  The police were called who called EMS.  Pt somnolent for EMS, but breathing.  He told them he took oxycodone.  Pt more somnolent now and is unable to give any hx.       Home Medications Prior to Admission medications   Medication Sig Start Date End Date Taking? Authorizing Provider  Cholecalciferol (VITAMIN D3) 25 MCG (1000 UT) CAPS Take 1,000 Units by mouth daily.    [provider]  diltiazem (TIAZAC) 180 MG 24 hr capsule Take 180 mg by mouth daily.    [provider]  escitalopram (LEXAPRO) 20 MG tablet Take 20 mg by mouth daily.    [provider]  gabapentin (NEURONTIN) 300 MG capsule Take 600 mg by mouth 4 (four) times daily.    [provider]  glipiZIDE (GLUCOTROL) 10 MG tablet Take 10 mg by mouth daily before breakfast.    [provider]  glucose 4 GM chewable tablet Chew 4 tablets by mouth as needed for low blood sugar (BGL less than 70).    [provider]  insulin glargine-yfgn (SEMGLEE, YFGN,) 100 UNIT/ML Pen Inject 20 Units into the skin at bedtime.    [provider]  melatonin 3 MG TABS tablet Take 6 mg by mouth at bedtime.    [provider]  metFORMIN (GLUCOPHAGE-XR) 500 MG 24 hr tablet Take 500 mg by mouth 2 (two) times daily with a meal.    [provider]  methadone (DOLOPHINE) 10 MG tablet Take 50 mg by mouth daily. Methadone clinic - [place] [phone] ([place]), pt receives Methadone 50mg  (liquid) daily Patient not taking: Reported on 09/28/2021 08/05/21   [provider]  methadone (DOLOPHINE) 10  MG/ML solution Take 60 mg by mouth in the morning.    [provider]  naproxen (NAPROSYN) 500 MG tablet Take 500 mg by mouth in the morning and at bedtime.    [provider]  pravastatin (PRAVACHOL) 40 MG tablet Take 40 mg by mouth at bedtime.    [provider]      Allergies    Patient has no known allergies.    Review of Systems   Review of Systems  Physical Exam Updated Vital Signs BP 117/85   Pulse 88   Temp 98.3 F (36.8 C) (Oral)   Resp 16   SpO2 98%  Physical Exam Vitals and nursing note reviewed.  HENT:     Head: Normocephalic and atraumatic.     Right Ear: External ear normal.     Left Ear: External ear normal.     Nose: Nose normal.     Mouth/Throat:     Mouth: Mucous membranes are dry.  Eyes:     Extraocular Movements: Extraocular movements intact.     Conjunctiva/sclera: Conjunctivae normal.     Pupils: Pupils are equal, round, and reactive to light.  Cardiovascular:     Rate and Rhythm: Regular rhythm. Tachycardia present.     Pulses: Normal pulses.     Heart sounds: Normal heart sounds.  Pulmonary:  Effort: Bradypnea and respiratory distress present.  Abdominal:     General: Abdomen is flat. Bowel sounds are normal.     Palpations: Abdomen is soft.  Musculoskeletal:        General: Normal range of motion.     Cervical back: Normal range of motion and neck supple.  Skin:    General: Skin is warm.     Capillary Refill: Capillary refill takes less than 2 seconds.  Neurological:     Mental Status: He is lethargic and disoriented.  Psychiatric:     Comments: Unable to assess     ED Results / Procedures / Treatments   Labs (all labs ordered are listed, but only abnormal results are displayed) Labs Reviewed  COMPREHENSIVE METABOLIC PANEL - Abnormal; Notable for the following components:      Result Value   Glucose, Bld 351 (*)    All other components within normal limits  SALICYLATE LEVEL - Abnormal; Notable for the  following components:   Salicylate Lvl Q000111Q (*)    All other components within normal limits  ACETAMINOPHEN LEVEL - Abnormal; Notable for the following components:   Acetaminophen (Tylenol), Serum <10 (*)    All other components within normal limits  CBC WITH DIFFERENTIAL/PLATELET - Abnormal; Notable for the following components:   RBC 3.77 (*)    Hemoglobin 11.2 (*)    HCT 34.0 (*)    Platelets 413 (*)    All other components within normal limits  URINALYSIS, ROUTINE W REFLEX MICROSCOPIC - Abnormal; Notable for the following components:   Color, Urine STRAW (*)    Glucose, UA >=500 (*)    All other components within normal limits  CBG MONITORING, ED - Abnormal; Notable for the following components:   Glucose-Capillary 328 (*)    All other components within normal limits  ETHANOL  RAPID URINE DRUG SCREEN, HOSP PERFORMED    EKG EKG Interpretation  Date/Time:  Thursday October 27 2021 11:32:22 EDT Ventricular Rate:  101 PR Interval:  147 QRS Duration: 83 QT Interval:  378 QTC Calculation: 490 R Axis:   29 Text Interpretation: Sinus tachycardia Borderline prolonged QT interval Since last tracing rate faster Confirmed by Isla Pence [phone]) on 10/27/2021 11:51:09 AM  Radiology DG Chest Port 1 View  Result Date: 10/27/2021 CLINICAL DATA:  Altered mental status. EXAM: PORTABLE CHEST 1 VIEW COMPARISON:  09/28/2021 FINDINGS: Stable cardiomediastinal contours. Low lung volumes. Slight asymmetric elevation of the left hemidiaphragm. There is an opacity along the left lung base which may represent atelectasis or airspace disease. No signs of pleural effusion. IMPRESSION: 1. Left base opacity may represent atelectasis or airspace disease. 2. Low lung volumes. Electronically Signed   By: Kerby Moors M.D.   On: 10/27/2021 12:22    Procedures Procedures    Medications Ordered in ED Medications  sodium chloride 0.9 % bolus 1,000 mL (0 mLs Intravenous Stopped 10/27/21 1237)     And  0.9 %  sodium chloride infusion ( Intravenous New Bag/Given 10/27/21 1315)  naloxone (NARCAN) 2 MG/2ML injection (2 mg  Given 10/27/21 1129)  ondansetron (ZOFRAN) 4 MG/2ML injection (4 mg  Given 10/27/21 1129)  LORazepam (ATIVAN) 2 MG/ML injection (1 mg  Given 10/27/21 1130)    ED Course/ Medical Decision Making/ A&P                           Medical Decision Making Amount and/or Complexity of Data Reviewed Labs: ordered. Radiology:  ordered.  Risk Prescription drug management.   This patient presents to the ED for concern of ams, this involves an extensive number of treatment options, and is a complaint that carries with it a high risk of complications and morbidity.  The differential diagnosis includes drug overdose, psych, electrolyte abn   Co morbidities that complicate the patient evaluation   DM2, chronic pain, PTSD, and polysubstance abuse   Additional history obtained:  Additional history obtained from epic chart review External records from outside source obtained and reviewed including EMS report   Lab Tests:  I Ordered, and personally interpreted labs.  The pertinent results include:  cbc with hgb 11.2 (chronic); uds neg; cmp with glucose elevated at 351   Imaging Studies ordered:  I ordered imaging studies including cxr  I independently visualized and interpreted imaging which showed  IMPRESSION:  1. Left base opacity may represent atelectasis or airspace disease.  2. Low lung volumes.   I agree with the radiologist interpretation   Cardiac Monitoring:  The patient was maintained on a cardiac monitor.  I personally viewed and interpreted the cardiac monitored which showed an underlying rhythm of: st initially; now nsr   Medicines ordered and prescription drug management:  I ordered medication including ivfs  for dehydration  Reevaluation of the patient after these medicines showed that the patient improved I have reviewed the patients home  medicines and have made adjustments as needed   Critical Interventions:  narcan   Problem List / ED Course:  Opiate OD:  pt's breathing worsened, so he was given 2 mg narcan.  That stopped his resp depression, but he became very anxious and kept yelling for [place] to stop the pain.  RR shot up and HR went up.  Pt given 1 mg ativan to calm him down.  That helped pt significantly.  Pt observed for several hours and is breathing normally.  He is eating and drinking.  His wife said he's been going to the methadone clinic and went there this am.   Hyperglycemia:  pt said he has his dm meds and only takes them when he wants to.  He wants juice and cake, but does not seem to understand those are not good food choices for diabetes.  Pt encouraged to take his meds and to eat a diabetic diet.  He did not seem interested in doing so.   Reevaluation:  After the interventions noted above, I reevaluated the patient and found that they have :improved   Social Determinants of Health:  homeless   Dispostion:  After consideration of the diagnostic results and the patients response to treatment, I feel that the patent would benefit from discharge with outpatient f/u.    CRITICAL CARE Performed by: Isla Pence   Total critical care time: 30 minutes  Critical care time was exclusive of separately billable procedures and treating other patients.  Critical care was necessary to treat or prevent imminent or life-threatening deterioration.  Critical care was time spent personally by me on the following activities: development of treatment plan with patient and/or surrogate as well as nursing, discussions with consultants, evaluation of patient's response to treatment, examination of patient, obtaining history from patient or surrogate, ordering and performing treatments and interventions, ordering and review of laboratory studies, ordering and review of radiographic studies, pulse oximetry and  re-evaluation of patient's condition.         Final Clinical Impression(s) / ED Diagnoses Final diagnoses:  Opiate overdose, accidental or unintentional, initial  encounter [facility])  Hyperglycemia    Rx / DC Orders ED Discharge Orders     None         Isla Pence, MD 10/27/21 1337

## 2023-04-28 ENCOUNTER — Ambulatory Visit (HOSPITAL_COMMUNITY)
Admission: EM | Admit: 2023-04-28 | Discharge: 2023-04-28 | Disposition: A | Discharge status: Other Acute Inpatient Hospital

## 2023-04-28 DIAGNOSIS — R410 Disorientation, unspecified: Secondary | ICD-10-CM

## 2023-04-28 LAB — GLUCOSE, CAPILLARY: Glucose-Capillary: 142 mg/dL — ABNORMAL HIGH (ref 70–99)

## 2023-04-28 NOTE — ED Notes (Signed)
 Patient transferred via EMS to [facility]
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
## 2023-04-28 NOTE — ED Notes (Signed)
 Non emergent EMS contacted for transport to [facility].

## 2023-04-28 NOTE — ED Provider Notes (Signed)
 Behavioral Health Urgent Care Medical Screening Exam  Patient Name: Lucas White MRN: [id number] Date of Evaluation: 04/28/23 Chief Complaint:   Diagnosis:  Final diagnoses:  Disoriented    History of Present illness: Lucas White is a 63 y.o. male with a past psychiatric history significant for bipolar disorder, opioid use disorder as well as a past medical history significant for frontal lobe dementia who presents to [facility] by way of [employer].  During the assessment, patient was visibly confused and disoriented.  Patient was unable to answer questions addressed to him.  Provider was able to receive a statement from patient's wife regarding patient's condition.  She reported that the patient has been diagnosed with bipolar disorder as well as frontal lobe dementia.  Prior to being assessed at [facility], the patient's wife reported that patient would experience episodes of confusion characterized by not knowing where he is.  She reported that the patient would occasionally act erratically and would try to leave the house.  She reported the patient with a history of being violent and aggressive but denied that he was violent or aggressive prior to being taken to [facility].    Patient is alert and disoriented, calm, cooperative, unable to engage in conversation during the encounter.  Patient maintained minimal eye contact.  Patient's speech is garbled.  Patient's thought processes are disorganized.  Patient's thought content is incoherent.  Unable to assess the patient's mood due to the patient being confused and disoriented.  Unable to determine if the patient is experiencing auditory or visual hallucinations.  Given patient's history of opioid use, provider to have patient sent to [facility] for medical clearance.  Flowsheet Row ED from 09/28/2021 in [facility] Emergency Department at [facility] ED from 03/23/2021 in [facility] Emergency Department at  [facility] ED from 10/19/2020 in [facility] Emergency Department at [facility]  C-SSRS RISK CATEGORY No Risk No Risk No Risk       Psychiatric Specialty Exam  Presentation  General Appearance:Casual  Eye Contact:Minimal  Speech:Garbled  Speech Volume:Normal  Handedness:Right   Mood and Affect  Mood: -- (Disoriented and confused)  Affect: Other (comment) (Disoriented and confused)   Thought Process  Thought Processes: Disorganized  Descriptions of Associations:Loose  Orientation:None  Thought Content:Illogical  Diagnosis of Schizophrenia or Schizoaffective disorder in past: No data recorded Duration of Psychotic Symptoms: No data recorded Hallucinations:-- (Unable to determine)  Ideas of Reference:-- (Unable to determine)  Suicidal Thoughts:-- (Unable to determine)  Homicidal Thoughts:-- (Unable to determine)   Sensorium  Memory: -- (Unable to determine)  Judgment: Impaired  Insight: Lacking   Executive Functions  Concentration: Poor  Attention Span: Poor  Recall: Poor  Fund of Knowledge: Poor  Language: Poor   Psychomotor Activity  Psychomotor Activity: Decreased   Assets  Assets: Social Support   Sleep  Sleep: -- (Unable to determine)  Number of hours: No data recorded  Physical Exam: Physical Exam Constitutional:      Comments: Patient not alert. Patient exhibited disorientation and confusion.  HENT:     Head: Normocephalic and atraumatic.     Nose: Nose normal.  Cardiovascular:     Rate and Rhythm: Normal rate and regular rhythm.  Pulmonary:     Effort: Pulmonary effort is normal.  Musculoskeletal:     Cervical back: Neck supple.  Neurological:     Mental Status: He is disoriented.  Psychiatric:  Attention and Perception: He is inattentive.        Mood and Affect: Affect is blunt.        Speech: Speech is slurred.        Behavior: Behavior is slowed.        Cognition and Memory:  Cognition is impaired. Memory is impaired.        Judgment: Judgment is inappropriate.    Review of Systems  HENT: Negative.    Eyes: Negative.   Respiratory: Negative.    Cardiovascular: Negative.   Gastrointestinal: Negative.   Skin: Negative.    Pulse 83, temperature 98.3 F (36.8 C), temperature source Oral, resp. rate 16, SpO2 93%. There is no height or weight on file to calculate BMI.  Musculoskeletal: Strength & Muscle Tone: within normal limits Gait & Station: normal Patient leans: N/A   [facility] MSE Discharge Disposition for Follow up and Recommendations: Based on my evaluation the patient appears to have an emergency medical condition for which I recommend the patient be transferred to the emergency department for further evaluation.  Patient to be transferred over to [facility] for Medical Clearance.  Gates Kasal, PA 04/28/2023, 11:05 PM

## 2023-04-29 ENCOUNTER — Emergency Department (HOSPITAL_COMMUNITY)

## 2023-04-29 ENCOUNTER — Emergency Department (HOSPITAL_COMMUNITY)
Admission: EM | Admit: 2023-04-29 | Discharge: 2023-04-30 | Disposition: A | Discharge status: Home / Self Care | Attending: Emergency Medicine | Admitting: Emergency Medicine

## 2023-04-29 ENCOUNTER — Encounter (HOSPITAL_COMMUNITY): Payer: Self-pay | Admitting: Emergency Medicine

## 2023-04-29 ENCOUNTER — Other Ambulatory Visit: Payer: Self-pay

## 2023-04-29 DIAGNOSIS — G3109 Other frontotemporal dementia: Secondary | ICD-10-CM | POA: Insufficient documentation

## 2023-04-29 DIAGNOSIS — Z7984 Long term (current) use of oral hypoglycemic drugs: Secondary | ICD-10-CM | POA: Diagnosis not present

## 2023-04-29 DIAGNOSIS — E119 Type 2 diabetes mellitus without complications: Secondary | ICD-10-CM | POA: Insufficient documentation

## 2023-04-29 DIAGNOSIS — F329 Major depressive disorder, single episode, unspecified: Secondary | ICD-10-CM | POA: Insufficient documentation

## 2023-04-29 DIAGNOSIS — F319 Bipolar disorder, unspecified: Secondary | ICD-10-CM | POA: Diagnosis not present

## 2023-04-29 DIAGNOSIS — F03918 Unspecified dementia, unspecified severity, with other behavioral disturbance: Secondary | ICD-10-CM | POA: Diagnosis not present

## 2023-04-29 DIAGNOSIS — F0390 Unspecified dementia without behavioral disturbance: Secondary | ICD-10-CM | POA: Insufficient documentation

## 2023-04-29 DIAGNOSIS — F02811 Dementia in other diseases classified elsewhere, unspecified severity, with agitation: Secondary | ICD-10-CM | POA: Diagnosis not present

## 2023-04-29 DIAGNOSIS — R4689 Other symptoms and signs involving appearance and behavior: Secondary | ICD-10-CM | POA: Diagnosis present

## 2023-04-29 LAB — SALICYLATE LEVEL: Salicylate Lvl: <7 mg/dL — ABNORMAL LOW (ref 7.0–30.0)

## 2023-04-29 LAB — ACETAMINOPHEN LEVEL: Acetaminophen (Tylenol), Serum: <10 ug/mL — ABNORMAL LOW (ref 10–30)

## 2023-04-29 LAB — URINALYSIS, ROUTINE W REFLEX MICROSCOPIC
Bacteria, UA: NONE SEEN
Bilirubin Urine: NEGATIVE
Glucose, UA: >=500 mg/dL — AB
Hgb urine dipstick: NEGATIVE
Ketones, ur: NEGATIVE mg/dL
Leukocytes,Ua: NEGATIVE
Nitrite: NEGATIVE
Protein, ur: NEGATIVE mg/dL
Specific Gravity, Urine: 1.03 (ref 1.005–1.030)
pH: 5 (ref 5.0–8.0)

## 2023-04-29 LAB — CBC WITH DIFFERENTIAL/PLATELET
Abs Immature Granulocytes: 0.02 10*3/uL (ref 0.00–0.07)
Basophils Absolute: 0.1 10*3/uL (ref 0.0–0.1)
Basophils Relative: 1 %
Eosinophils Absolute: 0.3 10*3/uL (ref 0.0–0.5)
Eosinophils Relative: 4 %
HCT: 35.8 % — ABNORMAL LOW (ref 39.0–52.0)
Hemoglobin: 11.3 g/dL — ABNORMAL LOW (ref 13.0–17.0)
Immature Granulocytes: 0 %
Lymphocytes Relative: 40 %
Lymphs Abs: 2.7 10*3/uL (ref 0.7–4.0)
MCH: 28.5 pg (ref 26.0–34.0)
MCHC: 31.6 g/dL (ref 30.0–36.0)
MCV: 90.4 fL (ref 80.0–100.0)
Monocytes Absolute: 0.4 10*3/uL (ref 0.1–1.0)
Monocytes Relative: 6 %
Neutro Abs: 3.4 10*3/uL (ref 1.7–7.7)
Neutrophils Relative %: 49 %
Platelets: 466 10*3/uL — ABNORMAL HIGH (ref 150–400)
RBC: 3.96 MIL/uL — ABNORMAL LOW (ref 4.22–5.81)
RDW: 12.3 % (ref 11.5–15.5)
WBC: 6.8 10*3/uL (ref 4.0–10.5)
nRBC: 0 % (ref 0.0–0.2)

## 2023-04-29 LAB — CBG MONITORING, ED
Glucose-Capillary: 123 mg/dL — ABNORMAL HIGH (ref 70–99)
Glucose-Capillary: 187 mg/dL — ABNORMAL HIGH (ref 70–99)

## 2023-04-29 LAB — RAPID URINE DRUG SCREEN, HOSP PERFORMED
Amphetamines: NOT DETECTED
Barbiturates: NOT DETECTED
Benzodiazepines: NOT DETECTED
Cocaine: NOT DETECTED
Opiates: NOT DETECTED
Tetrahydrocannabinol: NOT DETECTED

## 2023-04-29 LAB — COMPREHENSIVE METABOLIC PANEL WITH GFR
ALT: 19 U/L (ref 0–44)
AST: 30 U/L (ref 15–41)
Albumin: 4 g/dL (ref 3.5–5.0)
Alkaline Phosphatase: 165 U/L — ABNORMAL HIGH (ref 38–126)
Anion gap: 8 (ref 5–15)
BUN: 7 mg/dL (ref 6–20)
CO2: 29 mmol/L (ref 22–32)
Calcium: 9.3 mg/dL (ref 8.9–10.3)
Chloride: 106 mmol/L (ref 98–111)
Creatinine, Ser: 1.13 mg/dL (ref 0.61–1.24)
GFR, Estimated: >60 mL/min (ref >60)
Glucose, Bld: 132 mg/dL — ABNORMAL HIGH (ref 70–99)
Potassium: 3.6 mmol/L (ref 3.5–5.1)
Sodium: 143 mmol/L (ref 135–145)
Total Bilirubin: 0.6 mg/dL (ref 0.0–1.2)
Total Protein: 7.9 g/dL (ref 6.5–8.1)

## 2023-04-29 LAB — ETHANOL: Alcohol, Ethyl (B): <15 mg/dL (ref <15)

## 2023-04-29 LAB — AMMONIA: Ammonia: 20 umol/L (ref 9–35)

## 2023-04-29 MED ORDER — ESCITALOPRAM OXALATE 10 MG PO TABS
20.0000 mg | ORAL_TABLET | Freq: Every day | ORAL | Status: DC
  Administered 2023-04-29 – 2023-04-30 (×2): 20 mg via ORAL
  Filled 2023-04-29 (×2): qty 2

## 2023-04-29 MED ORDER — GLIPIZIDE 5 MG PO TABS
5.0000 mg | ORAL_TABLET | Freq: Two times a day (BID) | ORAL | Status: DC
  Administered 2023-04-29 – 2023-04-30 (×3): 5 mg via ORAL
  Filled 2023-04-29 (×4): qty 1

## 2023-04-29 MED ORDER — DICLOFENAC SODIUM 1 % EX GEL: 4.0000 g | Freq: Three times a day (TID) | CUTANEOUS | Status: DC | PRN

## 2023-04-29 MED ORDER — MELATONIN 3 MG PO TABS
6.0000 mg | ORAL_TABLET | Freq: Every day | ORAL | Status: DC
Start: 2023-04-29 — End: 2023-05-01
  Administered 2023-04-29: 6 mg via ORAL
  Filled 2023-04-29 (×2): qty 2

## 2023-04-29 MED ORDER — LINAGLIPTIN 5 MG PO TABS
5.0000 mg | ORAL_TABLET | Freq: Every day | ORAL | Status: DC
  Filled 2023-04-29: qty 1

## 2023-04-29 MED ORDER — PRAVASTATIN SODIUM 20 MG PO TABS
40.0000 mg | ORAL_TABLET | Freq: Every day | ORAL | Status: DC
Start: 2023-04-29 — End: 2023-05-01
  Administered 2023-04-29: 40 mg via ORAL
  Filled 2023-04-29 (×2): qty 2

## 2023-04-29 MED ORDER — QUETIAPINE FUMARATE 25 MG PO TABS
25.0000 mg | ORAL_TABLET | Freq: Every day | ORAL | Status: DC
  Administered 2023-04-29: 25 mg via ORAL
  Filled 2023-04-29 (×2): qty 1

## 2023-04-29 MED ORDER — ZIPRASIDONE MESYLATE 20 MG IM SOLR: 20.0000 mg | INTRAMUSCULAR | Status: DC | PRN

## 2023-04-29 MED ORDER — CYCLOBENZAPRINE HCL 10 MG PO TABS: 5.0000 mg | ORAL_TABLET | Freq: Every evening | ORAL | Status: DC | PRN

## 2023-04-29 MED ORDER — ONDANSETRON HCL 4 MG PO TABS: 4.0000 mg | ORAL_TABLET | Freq: Three times a day (TID) | ORAL | Status: DC | PRN

## 2023-04-29 MED ORDER — EMPAGLIFLOZIN-LINAGLIP-METFORM 25-5-1000 MG PO TB24: 1.0000 | ORAL_TABLET | Freq: Every evening | ORAL | Status: DC

## 2023-04-29 MED ORDER — METFORMIN HCL ER 500 MG PO TB24
1000.0000 mg | ORAL_TABLET | Freq: Every day | ORAL | Status: DC
  Administered 2023-04-30: 1000 mg via ORAL
  Filled 2023-04-29 (×2): qty 2

## 2023-04-29 MED ORDER — GABAPENTIN 300 MG PO CAPS
600.0000 mg | ORAL_CAPSULE | Freq: Four times a day (QID) | ORAL | Status: DC
  Administered 2023-04-29 – 2023-04-30 (×5): 600 mg via ORAL
  Filled 2023-04-29 (×7): qty 2

## 2023-04-29 MED ORDER — OLANZAPINE 5 MG PO TABS
7.5000 mg | ORAL_TABLET | Freq: Two times a day (BID) | ORAL | Status: DC
  Administered 2023-04-29 – 2023-04-30 (×3): 7.5 mg via ORAL
  Filled 2023-04-29 (×4): qty 1

## 2023-04-29 MED ORDER — LORAZEPAM 1 MG PO TABS: 1.0000 mg | ORAL_TABLET | ORAL | Status: DC | PRN

## 2023-04-29 MED ORDER — LINAGLIPTIN 5 MG PO TABS
5.0000 mg | ORAL_TABLET | Freq: Every day | ORAL | Status: DC
  Administered 2023-04-30: 5 mg via ORAL
  Filled 2023-04-29 (×2): qty 1

## 2023-04-29 MED ORDER — PANTOPRAZOLE SODIUM 40 MG PO TBEC
40.0000 mg | DELAYED_RELEASE_TABLET | Freq: Two times a day (BID) | ORAL | Status: DC
Start: 2023-04-29 — End: 2023-05-01
  Administered 2023-04-29 – 2023-04-30 (×3): 40 mg via ORAL
  Filled 2023-04-29 (×3): qty 1

## 2023-04-29 MED ORDER — VITAMIN D 25 MCG (1000 UNIT) PO TABS
1000.0000 [IU] | ORAL_TABLET | Freq: Every day | ORAL | Status: DC
  Administered 2023-04-29 – 2023-04-30 (×2): 1000 [IU] via ORAL
  Filled 2023-04-29 (×2): qty 1

## 2023-04-29 MED ORDER — INSULIN GLARGINE-YFGN 100 UNIT/ML ~~LOC~~ SOLN
20.0000 [IU] | Freq: Every day | SUBCUTANEOUS | Status: DC
  Administered 2023-04-29 – 2023-04-30 (×2): 20 [IU] via SUBCUTANEOUS
  Filled 2023-04-29 (×2): qty 0.2

## 2023-04-29 MED ORDER — TAMSULOSIN HCL 0.4 MG PO CAPS
0.4000 mg | ORAL_CAPSULE | Freq: Every day | ORAL | Status: DC
  Administered 2023-04-29: 0.4 mg via ORAL
  Filled 2023-04-29 (×2): qty 1

## 2023-04-29 MED ORDER — METHADONE HCL 5 MG PO TABS
10.0000 mg | ORAL_TABLET | Freq: Four times a day (QID) | ORAL | Status: DC
  Administered 2023-04-29 – 2023-04-30 (×5): 10 mg via ORAL
  Filled 2023-04-29 (×7): qty 2

## 2023-04-29 MED ORDER — DIVALPROEX SODIUM 125 MG PO CSDR
125.0000 mg | DELAYED_RELEASE_CAPSULE | Freq: Three times a day (TID) | ORAL | Status: DC
  Administered 2023-04-29 – 2023-04-30 (×5): 125 mg via ORAL
  Filled 2023-04-29 (×6): qty 1

## 2023-04-29 MED ORDER — RISPERIDONE 0.5 MG PO TBDP: 2.0000 mg | ORAL_TABLET | Freq: Three times a day (TID) | ORAL | Status: DC | PRN

## 2023-04-29 MED ORDER — EMPAGLIFLOZIN 25 MG PO TABS
25.0000 mg | ORAL_TABLET | Freq: Every day | ORAL | Status: DC
  Administered 2023-04-30: 25 mg via ORAL
  Filled 2023-04-29 (×2): qty 1

## 2023-04-29 NOTE — ED Notes (Signed)
 Pt has 2 belonging bags placed in the 18-18 cabinet

## 2023-04-29 NOTE — ED Notes (Signed)
Pt changed into burgundy scrubs °

## 2023-04-29 NOTE — ED Notes (Addendum)
 Pt didn't want his breakfast but I got him to eat a banana and something to drink (sprite)

## 2023-04-29 NOTE — ED Notes (Signed)
 Pt assisted with urinal and then bedside commode. When this writer entered room after pt finished with Programmer, applications noted pt urinated all over floor after using the urinal to obtain urine specimen.  When pt questioned about why he urinated on the floor pt stated " I didn't know where to go." Reminded pt of urinal and call bell to ask for help.
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
## 2023-04-29 NOTE — ED Notes (Signed)
 Patient sleeping comfortably. Chest rise and fall noted. Patient sitter outside of room.
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
## 2023-04-29 NOTE — ED Notes (Signed)
 Pt and I walked 2 laps around the department

## 2023-04-29 NOTE — BH Assessment (Signed)
 TTS requested tele-psychiatry consult with [facility]. Created secure conversation including EDP, Pt's RN, and [facility] Co[facility]ators to facilitate consult. [facility] Coordinator will message with name of provider and scheduled consult time.    Pamalee Leyden, [facility], [facility] Triage Specialist

## 2023-04-29 NOTE — ED Notes (Signed)
 Pt wife called and talked to EMT-P. Wife expressing confusion as to why the pt was sent to the hospital from [facility]. The situation was explained to the wife and wife was informed of pt status. Wife expressed that the pt was at his baseline but she was concerned for his mental health and wanted to ensure the pt received mental health care. Wife also requested to be called with updates.

## 2023-04-29 NOTE — ED Triage Notes (Signed)
  Patient BIB EMS from [facility] for AMS.  EMS states patient was dropped off at [facility] by [employer] but unsure why.  Patient was assessed by [facility] and stated he did not meet criteria for being there.  Patient alert on arrival and has no complaints of pain.

## 2023-04-29 NOTE — ED Notes (Signed)
 Pt drowsy but keeps mumbling and asking staff for a beer. Explained to pt that staff cannot give him a beer in the ED.

## 2023-04-29 NOTE — ED Provider Notes (Signed)
 Emergency Medicine Observation Re-evaluation Note  Lucas White is a 63 y.o. male, seen on rounds today.  Pt initially presented to the ED for complaints of Altered Mental Status Currently, the patient is sleeping.  Physical Exam  BP (!) 148/84 (BP Location: Right Arm)   Pulse 86   Temp 98.2 F (36.8 C) (Oral)   Resp 17   SpO2 100%  Physical Exam General: No acute distress Cardiac: Regular Lungs: Clear Psych: Calm and cooperative  ED Course / MDM  EKG:EKG Interpretation Date/Time:  Sunday April 29 2023 00:20:27 EDT Ventricular Rate:  67 PR Interval:  149 QRS Duration:  91 QT Interval:  418 QTC Calculation: 442 R Axis:   68  Text Interpretation: Sinus rhythm Confirmed by Kelsey Patricia [phone]) on 04/29/2023 1:13:47 AM  I have reviewed the labs performed to date as well as medications administered while in observation.  Recent changes in the last 24 hours include currently waiting for evaluation and pharmacy reconciliation so patient get and started back on his home meds.  Plan  Current plan is for TTS evaluation .    Almond Army, MD 04/29/23 [phone]

## 2023-04-30 DIAGNOSIS — F03918 Unspecified dementia, unspecified severity, with other behavioral disturbance: Secondary | ICD-10-CM

## 2023-04-30 MED ORDER — OLANZAPINE 7.5 MG PO TABS: 7.5000 mg | ORAL_TABLET | Freq: Two times a day (BID) | ORAL | 0 refills | Status: DC

## 2023-04-30 MED ORDER — QUETIAPINE FUMARATE 25 MG PO TABS: 25.0000 mg | ORAL_TABLET | Freq: Every day | ORAL | 0 refills | Status: DC

## 2023-04-30 MED ORDER — DIVALPROEX SODIUM 125 MG PO CSDR: 125.0000 mg | DELAYED_RELEASE_CAPSULE | Freq: Three times a day (TID) | ORAL | 0 refills | Status: DC

## 2023-04-30 MED ORDER — LINAGLIPTIN 5 MG PO TABS
5.0000 mg | ORAL_TABLET | Freq: Every day | ORAL | 0 refills | Status: DC
Start: 2023-05-01 — End: 2023-04-30

## 2023-04-30 MED ORDER — METFORMIN HCL ER (OSM) 1000 MG PO TB24
1000.0000 mg | ORAL_TABLET | Freq: Every day | ORAL | 0 refills | Status: DC
Start: 2023-05-01 — End: 2023-04-30

## 2023-04-30 MED ORDER — LINAGLIPTIN 5 MG PO TABS: 5.0000 mg | ORAL_TABLET | Freq: Every day | ORAL | 0 refills | Status: AC

## 2023-04-30 MED ORDER — METFORMIN HCL ER (OSM) 1000 MG PO TB24: 1000.0000 mg | ORAL_TABLET | Freq: Every day | ORAL | 0 refills | Status: AC

## 2023-04-30 NOTE — ED Notes (Addendum)
 Lucas White

## 2023-04-30 NOTE — ED Notes (Signed)
 Patient brought to the front to meet wife. Wife provided with home care instructions.

## 2023-04-30 NOTE — Consult Note (Cosign Needed Addendum)
 [facility] Consult Follow-[facility]  Patient Name: .Lucas White  MRN: [id number]  DOB: Jan 23, 1960  Consult Order details:  Orders (From admission, onward)     Start     Ordered   04/29/23 0534  CONSULT TO CALL ACT TEAM       Ordering Provider: Kelsey Patricia, MD  Provider:  (Not yet assigned)  Question:  Reason for Consult?  Answer:  Psych consult   04/29/23 0534             Mode of Visit: In person    Psychiatry Consult Evaluation  Service Date: April 30, 2023 LOS:  LOS: 0 days  Chief Complaint dementia, aggression  Primary Psychiatric Diagnoses  Dementia with behavioral disturbance Aggression MDD  Assessment  Lucas White is a 63 y.o. male admitted: Presented to the ED on 04/29/2023 12:04 AM for altered mental status and aggression towards family members. He carries the psychiatric diagnoses of PTSD, and MDD and has a past medical history of  type 2 diabetes and dementia.    His current presentation of behavioral disturbance and altered mental status is most consistent with dementia dx.  Current outpatient psychotropic medications include lexapro  and historically he has had no beneficial response to these medications. He was not compliant with medications prior to admission as evidenced by spouse report. On initial examination, patient is disoriented, disorganized, but pleasant. Please see plan below for detailed recommendations.   Diagnoses:  Active Hospital problems: Principal Problem:   Dementia with behavioral disturbance (HCC) Active Problems:   Aggressive behavior, adult    Plan   ## Psychiatric Medication Recommendations:   - Start Depakote Sprinkles 125 mg TID to target mood  - Start Seroquel  25 mg @ HS to target aggressive behaviors - Continue patient's home medications   ## Medical Decision Making Capacity: Not specifically addressed in this encounter   ## Further Work-up:  UA and UDS ordered-NEGATIVE -- most recent EKG on 04/29/23 had QtC  of 442     ## Disposition:-- Patient is Psychiatrically cleared.  ## Behavioral / Environmental: -Delirium Precautions: Delirium Interventions for Nursing and Staff: - RN to open blinds every AM. - To Bedside: Glasses, hearing aide, and pt's own shoes. Make available to patients. when possible and encourage use. - Encourage po fluids when appropriate, keep fluids within reach. - OOB to chair with meals. - Passive ROM exercises to all extremities with AM & PM care. - RN to assess orientation to person, time and place QAM and PRN. - Recommend extended visitation hours with familiar family/friends as feasible. - Staff to minimize disturbances at night. Turn off television when pt asleep or when not in use.                ## Safety and Observation Level:  - Based on my clinical evaluation, I estimate the patient to be at low risk of self harm in the current setting. - At this time, we recommend  routine. This decision is based on my review of the chart including patient's history and current presentation, interview of the patient, mental status examination, and consideration of suicide risk including evaluating suicidal ideation, plan, intent, suicidal or self-harm behaviors, risk factors, and protective factors. This judgment is based on our ability to directly address suicide risk, implement suicide prevention strategies, and develop a safety plan while the patient is in the clinical setting. Please contact our team if there is a concern that risk level has changed.   CSSR  Risk Category:C-SSRS RISK CATEGORY: No Risk   Suicide Risk Assessment: Patient has following modifiable risk factors for suicide: medication noncompliance, which we are addressing by encouraging medication compliance. Patient has following non-modifiable or demographic risk factors for suicide: male gender Patient has the following protective factors against suicide: Access to outpatient mental health care, Supportive family,  Cultural, spiritual, or religious beliefs that discourage suicide, no history of suicide attempts, and no history of NSSIB   Thank you for this consult request. Recommendations have been communicated to the primary team.  We will Psychiatrically clear this patient at this time.   Gian Ybarra C Rhona Fusilier, NP-PMHNP-BC       History of Present Illness  Relevant Aspects of Hospital ED Course:  Admitted on 04/29/2023 for altered mental status.   Patient Report:  Lucas White, 63 y.o., male patient seen face to face by this provider, consulted with Dr. Deborah Falling; and chart reviewed on 04/30/23.  On evaluation Lucas White patient is seen this afternoon awake, alert and cooperative but confused and disorganized related to Neurocognitive disorder.  When asked where he is he replied"  I brought my case here, I came here" but he could not state where he is, his name or what led to coming to the hospital.  His Memory and cognition is impaired, he has hx of Dementia.  For agitation and agitation, we added Seroquel  and Depakote to manage his mood.  So far today Nursing reports no agitation, no aggression and patient is taking his Medications.  He is eating and tolerating fluids.  Patient is not able to engage in meaningful conversation although he tries but is unable to articulate his words and thought. Call to his wife to discuss plan of care and discharge failed several times as she is not answering her calls. Collateral information:  From Mother, Lucas White on  04/30/2023 is that patient is surrounded by people he is not familiar in the house.  Mother states that patient opens the door and leave the house but there were  people in the house.  Mother states the wife is never home as she works and leaves patient in the care of people he is not familiar with.  Mother want patient placed in a facility but she states his wife want to keep him at home.  Provider informed mother that we will try and contact his wife and  inform her to come take him home or work with SW for placement.  Patient's mother was also informed that the ER does not keep patients till placement is gotten.  Unfortunately calls to his wife has not be answered. Since Medication management was made yesterday we will monitor overnight and Psychiatrically clear him to go home or placement.  Per DR Ike Malady, this patient can be discharged home to continue taking the new Medications for agitation.  DR Ike Malady believes the ER is not an observation unit and that patient can be cleared for Discharge to go home.  Provider called to wife five times has not been answered and his mother who was here earlier and discussed discharge with provider did not answer her call..  We will clear this patient and TOC will take over. Review of Systems  Neurological:        Dementia  Psychiatric/Behavioral:  Positive for depression and memory loss.        Aggression      Psychiatric and Social History  Psychiatric History:  Information collected from chart, wife  Prev Dx/Sx: dementia, depression, PTSD Current Psych Provider[facility]  Previous Med Trials: unknown Therapy: denies   Prior Psych Hospitalization: denies  Prior Self Harm: denies Prior Violence: recent aggressive behaviors towards family members      Social History:  Developmental Hx: wdl Legal Hx: denies Living Situation: Resides at home with wife Spiritual Hx: yes Access to weapons/lethal means: denies    Substance History Alcohol: denies Past hx  Tobacco: denies Illicit drugs: denies Past Hx  Exam Findings  Physical Exam:  Vital Signs:  Temp:  [98.1 F (36.7 C)-98.3 F (36.8 C)] 98.1 F (36.7 C) (04/28 0641) Pulse Rate:  [70-78] 70 (04/28 0641) Resp:  [16-20] 16 (04/28 0641) BP: (112-130)/(69-80) 112/69 (04/28 0641) SpO2:  [98 %-99 %] 98 % (04/28 0641) Blood pressure 112/69, pulse 70, temperature 98.1 F (36.7 C), temperature source Oral,  resp. rate 16, SpO2 98%. There is no height or weight on file to calculate BMI.  Physical Exam Vitals and nursing note reviewed. Exam conducted with a chaperone present.  Neurological:     Mental Status: He is alert.  Psychiatric:        Mood and Affect: Affect is flat.        Behavior: Behavior is cooperative.        Cognition and Memory: Cognition is impaired.     Comments: Mental status is at baseline. He is disoriented.     Mental Status Exam: General Appearance: Well Groomed  Orientation:  to self and DOB  Memory:  Immediate;   Fair Recent;   Poor  Concentration:  Concentration: Fair  Recall:  Poor  Attention  Fair  Eye Contact:  Fair  Speech:  Garbled  Language:  Fair  Volume:  Normal  Mood: "good"  Affect:  Appropriate  Thought Process:  Disorganized and Irrelevant  Thought Content:  Tangential  Suicidal Thoughts:  No  Homicidal Thoughts:  No  Judgement:  Impaired  Insight:  Lacking  Psychomotor Activity:  Normal  Akathisia:  No  Fund of Knowledge:  Fair     Assets:  IT sales professional Social Support  Cognition:  Impaired,  Moderate  ADL's:  intact with encouragement  AIMS (if indicated):        Other History   These have been pulled in through the EMR, reviewed, and updated if appropriate.  Family History:  The patient's family history includes Diabetes in his father.  Medical History: Past Medical History:  Diagnosis Date   Chronic back pain greater than 3 months duration    Obesity (BMI 30.0-34.9)    Pneumonia due to COVID-19 virus 12/19/2018   Type 2 diabetes mellitus (HCC)     Surgical History: Past Surgical History:  Procedure Laterality Date   BACK SURGERY     COLONOSCOPY       Medications:   Current Facility-Administered Medications:    cholecalciferol  (VITAMIN D3) 25 MCG (1000 UNIT) tablet 1,000 Units, 1,000 Units, Oral, Daily, Almond Army, MD, 1,000 Units at 04/30/23 0955   cyclobenzaprine (FLEXERIL)  tablet 5 mg, 5 mg, Oral, QHS PRN, Almond Army, MD   diclofenac Sodium (VOLTAREN) 1 % topical gel 4 g, 4 g, Topical, TID PRN, Leida Puna, Whitney, MD   divalproex (DEPAKOTE SPRINKLE) capsule 125 mg, 125 mg, Oral, TID, Motley-Mangrum, Jadeka A, PMHNP, 125 mg at 04/30/23 0955   empagliflozin (JARDIANCE) tablet 25 mg, 25 mg, Oral, Q breakfast, 25 mg at 04/30/23 0823 **AND** metFORMIN  (GLUCOPHAGE -XR) 24 hr  tablet 1,000 mg, 1,000 mg, Oral, Q breakfast, 1,000 mg at 04/30/23 7829 **AND** linagliptin  (TRADJENTA ) tablet 5 mg, 5 mg, Oral, Q breakfast, Rubie Corona, RPH, 5 mg at 04/30/23 5621   escitalopram  (LEXAPRO ) tablet 20 mg, 20 mg, Oral, Daily, Motley-Mangrum, Jadeka A, PMHNP, 20 mg at 04/30/23 0955   gabapentin  (NEURONTIN ) capsule 600 mg, 600 mg, Oral, QID, Motley-Mangrum, Jadeka A, PMHNP, 600 mg at 04/30/23 3086   glipiZIDE  (GLUCOTROL ) tablet 5 mg, 5 mg, Oral, BID AC, Plunkett, Whitney, MD, 5 mg at 04/30/23 5784   insulin  glargine-yfgn (SEMGLEE ) injection 20 Units, 20 Units, Subcutaneous, Daily, Almond Army, MD, 20 Units at 04/30/23 0954   risperiDONE (RISPERDAL M-TABS) disintegrating tablet 2 mg, 2 mg, Oral, Q8H PRN **AND** LORazepam  (ATIVAN ) tablet 1 mg, 1 mg, Oral, PRN **AND** ziprasidone  (GEODON ) injection 20 mg, 20 mg, Intramuscular, PRN, Kelsey Patricia, MD   melatonin tablet 6 mg, 6 mg, Oral, QHS, Plunkett, Whitney, MD, 6 mg at 04/29/23 2128   methadone  (DOLOPHINE ) tablet 10 mg, 10 mg, Oral, QID, Plunkett, Whitney, MD, 10 mg at 04/30/23 6962   OLANZapine  (ZYPREXA ) tablet 7.5 mg, 7.5 mg, Oral, BID, Plunkett, Whitney, MD, 7.5 mg at 04/30/23 0955   ondansetron  (ZOFRAN ) tablet 4 mg, 4 mg, Oral, Q8H PRN, Kelsey Patricia, MD   pantoprazole (PROTONIX) EC tablet 40 mg, 40 mg, Oral, BID AC, Plunkett, Whitney, MD, 40 mg at 04/30/23 9528   pravastatin  (PRAVACHOL ) tablet 40 mg, 40 mg, Oral, QHS, Plunkett, Whitney, MD, 40 mg at 04/29/23 2129   QUEtiapine  (SEROQUEL ) tablet 25 mg, 25 mg, Oral, QHS,  Motley-Mangrum, Jadeka A, PMHNP, 25 mg at 04/29/23 2139   tamsulosin  (FLOMAX ) capsule 0.4 mg, 0.4 mg, Oral, QHS, Plunkett, Whitney, MD, 0.4 mg at 04/29/23 2144  Current Outpatient Medications:    Cholecalciferol  (VITAMIN D3) 25 MCG (1000 UT) CAPS, Take 1,000 Units by mouth daily., Disp: , Rfl:    cyclobenzaprine (FLEXERIL) 10 MG tablet, Take 5 mg by mouth at bedtime as needed (for pain)., Disp: , Rfl:    diclofenac Sodium (VOLTAREN) 1 % GEL, Apply 4 g topically 3 (three) times daily as needed (for pain- affected areas)., Disp: , Rfl:    Empagliflozin-Linaglip-Metform 25-05-998 MG TB24, Take 1 tablet by mouth every evening., Disp: , Rfl:    escitalopram  (LEXAPRO ) 20 MG tablet, Take 20 mg by mouth daily., Disp: , Rfl:    gabapentin  (NEURONTIN ) 300 MG capsule, Take 600 mg by mouth 4 (four) times daily., Disp: , Rfl:    glipiZIDE  (GLUCOTROL ) 5 MG tablet, Take 5 mg by mouth 2 (two) times daily before a meal., Disp: , Rfl:    insulin  glargine (LANTUS ) 100 UNIT/ML Solostar Pen, Inject 20 Units into the skin daily in the afternoon., Disp: , Rfl:    lidocaine (LIDODERM) 5 %, Place 1 patch onto the skin daily as needed (for pain- Remove & Discard patch within 12 hours or as directed by MD)., Disp: , Rfl:    loperamide  (IMODIUM ) 2 MG capsule, Take 2 mg by mouth daily as needed for diarrhea or loose stools., Disp: , Rfl:    melatonin 3 MG TABS tablet, Take 6 mg by mouth at bedtime., Disp: , Rfl:    methadone  (DOLOPHINE ) 10 MG tablet, Take 10 mg by mouth 4 (four) times daily., Disp: , Rfl:    OLANZapine  (ZYPREXA ) 7.5 MG tablet, Take 7.5 mg by mouth 2 (two) times daily., Disp: , Rfl:    pantoprazole (PROTONIX) 40 MG tablet, Take 40 mg by  mouth 2 (two) times daily before a meal., Disp: , Rfl:    pravastatin  (PRAVACHOL ) 40 MG tablet, Take 40 mg by mouth at bedtime., Disp: , Rfl:    sitaGLIPtin (JANUVIA) 50 MG tablet, Take 50 mg by mouth daily., Disp: , Rfl:    tamsulosin  (FLOMAX ) 0.4 MG CAPS capsule, Take 0.4 mg  by mouth at bedtime., Disp: , Rfl:   Allergies: No Known Allergies  [place] Salamone C Jadamarie Butson, NP-PMHNP-BC

## 2023-04-30 NOTE — ED Notes (Signed)
 One visitor at bedside,

## 2023-04-30 NOTE — ED Provider Notes (Signed)
 Emergency Medicine Observation Re-evaluation Note  Lucas White is a 63 y.o. male, seen on rounds today.  Pt initially presented to the ED for complaints of Altered Mental Status Currently, the patient is sleeping.  Physical Exam  BP 112/69 (BP Location: Left Arm)   Pulse 70   Temp 98.1 F (36.7 C) (Oral)   Resp 16   SpO2 98%  Physical Exam General: Sleeping Cardiac: Extremities well-perfused Lungs: Breathing is unlabored Psych: Deferred  ED Course / MDM  EKG:EKG Interpretation Date/Time:  Sunday April 29 2023 00:20:27 EDT Ventricular Rate:  67 PR Interval:  149 QRS Duration:  91 QT Interval:  418 QTC Calculation: 442 R Axis:   68  Text Interpretation: Sinus rhythm Confirmed by Kelsey Patricia [id number]) on 04/29/2023 1:13:47 AM  I have reviewed the labs performed to date as well as medications administered while in observation.  Recent changes in the last 24 hours include evaluation by TTS who recommends overnight observation with reevaluation today.  Medication recommendations are for Depakote sprinkles and Seroquel .  These have been ordered.  Plan  Current plan is for TTS reevaluation.    Iva Mariner, MD 04/30/23 [phone]

## 2023-04-30 NOTE — ED Notes (Signed)
 Patient resting in bed.

## 2023-04-30 NOTE — Progress Notes (Addendum)
 CSW called all three numbers in the patient's chart, one number is disconnected, one is not accepting calls. CSW reached out to mom left HIPAA / VM CSW will continue to reach out.  Addend @ 6:51 PM  CSW called wife again phone still states that it is not accepting calls. No option for VM, RN reported that patient stated he does not know anyone else to get him.    [place] Darcey Cardy LCSW-A   04/30/2023 5:00 PM

## 2023-04-30 NOTE — Progress Notes (Signed)
 CSW spoke to the patient's mom. Patient's mom stated that she was unaware that the patient was being DC.  CSW explained to the mom that the patient has been DC for three hours. Patient's mom asked for information on LTC. CSW provided information on medicare.gov to look into facilites. Mother stated that they were on the way to get the patient. CSW has informed the patient's nurse.    [place] Tarea Skillman LCSW-A   04/30/2023 8:25 PM

## 2023-04-30 NOTE — ED Notes (Signed)
 [facility] called pts wife to inform her that pt has been psych cleared and can be picked up to return home. Pts wife's phone is not accepting calls at this time. Will try again.   Patt Boozer, [facility]  04/30/23

## 2023-04-30 NOTE — ED Notes (Signed)
 Patient sitting in chair in room at this time.

## 2023-04-30 NOTE — ED Notes (Signed)
 AVS printed and placed in room 27 binder with paper prescriptions

## 2023-04-30 NOTE — ED Notes (Signed)
 Call to pt wife from TCU cell,  no answer or vm

## 2023-04-30 NOTE — Discharge Instructions (Addendum)
 Medicare.GOV   Please call DSS for long term medicaid

## 2023-10-19 ENCOUNTER — Other Ambulatory Visit: Payer: Self-pay

## 2023-10-19 ENCOUNTER — Inpatient Hospital Stay (HOSPITAL_COMMUNITY)
Admission: EM | Admit: 2023-10-19 | Discharge: 2023-10-29 | DRG: 193 | Disposition: A | Discharge status: Home Health | Attending: Family Medicine | Admitting: Family Medicine

## 2023-10-19 ENCOUNTER — Observation Stay (HOSPITAL_COMMUNITY)

## 2023-10-19 ENCOUNTER — Emergency Department (HOSPITAL_COMMUNITY)

## 2023-10-19 ENCOUNTER — Encounter (HOSPITAL_COMMUNITY): Payer: Self-pay

## 2023-10-19 DIAGNOSIS — Z79891 Long term (current) use of opiate analgesic: Secondary | ICD-10-CM

## 2023-10-19 DIAGNOSIS — E669 Obesity, unspecified: Secondary | ICD-10-CM | POA: Diagnosis present

## 2023-10-19 DIAGNOSIS — R4182 Altered mental status, unspecified: Secondary | ICD-10-CM | POA: Diagnosis not present

## 2023-10-19 DIAGNOSIS — Z781 Physical restraint status: Secondary | ICD-10-CM

## 2023-10-19 DIAGNOSIS — R4689 Other symptoms and signs involving appearance and behavior: Secondary | ICD-10-CM | POA: Diagnosis present

## 2023-10-19 DIAGNOSIS — F4310 Post-traumatic stress disorder, unspecified: Secondary | ICD-10-CM | POA: Diagnosis present

## 2023-10-19 DIAGNOSIS — Z794 Long term (current) use of insulin: Secondary | ICD-10-CM

## 2023-10-19 DIAGNOSIS — G894 Chronic pain syndrome: Secondary | ICD-10-CM | POA: Diagnosis present

## 2023-10-19 DIAGNOSIS — J159 Unspecified bacterial pneumonia: Secondary | ICD-10-CM | POA: Diagnosis not present

## 2023-10-19 DIAGNOSIS — F05 Delirium due to known physiological condition: Secondary | ICD-10-CM | POA: Diagnosis present

## 2023-10-19 DIAGNOSIS — F0393 Unspecified dementia, unspecified severity, with mood disturbance: Secondary | ICD-10-CM | POA: Diagnosis present

## 2023-10-19 DIAGNOSIS — F319 Bipolar disorder, unspecified: Secondary | ICD-10-CM | POA: Diagnosis present

## 2023-10-19 DIAGNOSIS — E1165 Type 2 diabetes mellitus with hyperglycemia: Secondary | ICD-10-CM | POA: Diagnosis present

## 2023-10-19 DIAGNOSIS — N400 Enlarged prostate without lower urinary tract symptoms: Secondary | ICD-10-CM | POA: Diagnosis present

## 2023-10-19 DIAGNOSIS — Z7401 Bed confinement status: Secondary | ICD-10-CM | POA: Diagnosis not present

## 2023-10-19 DIAGNOSIS — F03911 Unspecified dementia, unspecified severity, with agitation: Secondary | ICD-10-CM | POA: Diagnosis present

## 2023-10-19 DIAGNOSIS — Z79899 Other long term (current) drug therapy: Secondary | ICD-10-CM

## 2023-10-19 DIAGNOSIS — K219 Gastro-esophageal reflux disease without esophagitis: Secondary | ICD-10-CM | POA: Diagnosis present

## 2023-10-19 DIAGNOSIS — F03918 Unspecified dementia, unspecified severity, with other behavioral disturbance: Secondary | ICD-10-CM | POA: Diagnosis present

## 2023-10-19 DIAGNOSIS — R748 Abnormal levels of other serum enzymes: Secondary | ICD-10-CM | POA: Diagnosis present

## 2023-10-19 DIAGNOSIS — Z7984 Long term (current) use of oral hypoglycemic drugs: Secondary | ICD-10-CM

## 2023-10-19 DIAGNOSIS — F909 Attention-deficit hyperactivity disorder, unspecified type: Secondary | ICD-10-CM | POA: Diagnosis present

## 2023-10-19 DIAGNOSIS — E66811 Obesity, class 1: Secondary | ICD-10-CM | POA: Diagnosis present

## 2023-10-19 DIAGNOSIS — G9341 Metabolic encephalopathy: Secondary | ICD-10-CM | POA: Diagnosis not present

## 2023-10-19 DIAGNOSIS — R410 Disorientation, unspecified: Secondary | ICD-10-CM | POA: Diagnosis not present

## 2023-10-19 DIAGNOSIS — Z833 Family history of diabetes mellitus: Secondary | ICD-10-CM

## 2023-10-19 DIAGNOSIS — J189 Pneumonia, unspecified organism: Secondary | ICD-10-CM

## 2023-10-19 DIAGNOSIS — E1142 Type 2 diabetes mellitus with diabetic polyneuropathy: Secondary | ICD-10-CM | POA: Diagnosis present

## 2023-10-19 DIAGNOSIS — E785 Hyperlipidemia, unspecified: Secondary | ICD-10-CM | POA: Diagnosis present

## 2023-10-19 DIAGNOSIS — Z8616 Personal history of COVID-19: Secondary | ICD-10-CM

## 2023-10-19 DIAGNOSIS — F29 Unspecified psychosis not due to a substance or known physiological condition: Secondary | ICD-10-CM | POA: Diagnosis present

## 2023-10-19 DIAGNOSIS — W19XXXA Unspecified fall, initial encounter: Secondary | ICD-10-CM

## 2023-10-19 DIAGNOSIS — R32 Unspecified urinary incontinence: Secondary | ICD-10-CM | POA: Diagnosis present

## 2023-10-19 DIAGNOSIS — Z6830 Body mass index (BMI) 30.0-30.9, adult: Secondary | ICD-10-CM

## 2023-10-19 DIAGNOSIS — Y92008 Other place in unspecified non-institutional (private) residence as the place of occurrence of the external cause: Secondary | ICD-10-CM

## 2023-10-19 DIAGNOSIS — W1830XA Fall on same level, unspecified, initial encounter: Secondary | ICD-10-CM | POA: Diagnosis present

## 2023-10-19 DIAGNOSIS — E11649 Type 2 diabetes mellitus with hypoglycemia without coma: Secondary | ICD-10-CM | POA: Diagnosis present

## 2023-10-19 DIAGNOSIS — Z1152 Encounter for screening for COVID-19: Secondary | ICD-10-CM

## 2023-10-19 DIAGNOSIS — R600 Localized edema: Secondary | ICD-10-CM | POA: Diagnosis present

## 2023-10-19 DIAGNOSIS — L89892 Pressure ulcer of other site, stage 2: Secondary | ICD-10-CM | POA: Diagnosis not present

## 2023-10-19 DIAGNOSIS — R29898 Other symptoms and signs involving the musculoskeletal system: Secondary | ICD-10-CM | POA: Diagnosis present

## 2023-10-19 LAB — CBG MONITORING, ED
Glucose-Capillary: 63 mg/dL — ABNORMAL LOW (ref 70–99)
Glucose-Capillary: 105 mg/dL — ABNORMAL HIGH (ref 70–99)

## 2023-10-19 LAB — GLUCOSE, CAPILLARY
Glucose-Capillary: 101 mg/dL — ABNORMAL HIGH (ref 70–99)
Glucose-Capillary: 70 mg/dL (ref 70–99)
Glucose-Capillary: 75 mg/dL (ref 70–99)

## 2023-10-19 LAB — CBC WITH DIFFERENTIAL/PLATELET
Abs Immature Granulocytes: 0.02 K/uL (ref 0.00–0.07)
Basophils Absolute: 0 K/uL (ref 0.0–0.1)
Basophils Relative: 0 %
Eosinophils Absolute: 0.2 K/uL (ref 0.0–0.5)
Eosinophils Relative: 3 %
HCT: 36.5 % — ABNORMAL LOW (ref 39.0–52.0)
Hemoglobin: 11.6 g/dL — ABNORMAL LOW (ref 13.0–17.0)
Immature Granulocytes: 0 %
Lymphocytes Relative: 21 %
Lymphs Abs: 1.6 K/uL (ref 0.7–4.0)
MCH: 28.5 pg (ref 26.0–34.0)
MCHC: 31.8 g/dL (ref 30.0–36.0)
MCV: 89.7 fL (ref 80.0–100.0)
Monocytes Absolute: 0.6 K/uL (ref 0.1–1.0)
Monocytes Relative: 8 %
Neutro Abs: 4.9 K/uL (ref 1.7–7.7)
Neutrophils Relative %: 68 %
Platelets: 400 K/uL (ref 150–400)
RBC: 4.07 MIL/uL — ABNORMAL LOW (ref 4.22–5.81)
RDW: 12.6 % (ref 11.5–15.5)
WBC: 7.3 K/uL (ref 4.0–10.5)
nRBC: 0 % (ref 0.0–0.2)

## 2023-10-19 LAB — I-STAT CG4 LACTIC ACID, ED
Lactic Acid, Venous: 1.8 mmol/L (ref 0.5–1.9)
Lactic Acid, Venous: 1.2 mmol/L (ref 0.5–1.9)
Lactic Acid, Venous: 1 mmol/L (ref 0.5–1.9)

## 2023-10-19 LAB — COMPREHENSIVE METABOLIC PANEL WITH GFR
ALT: 8 U/L (ref 0–44)
AST: 28 U/L (ref 15–41)
Albumin: 4.3 g/dL (ref 3.5–5.0)
Alkaline Phosphatase: 90 U/L (ref 38–126)
Anion gap: 10 (ref 5–15)
BUN: 9 mg/dL (ref 8–23)
CO2: 30 mmol/L (ref 22–32)
Calcium: 9.9 mg/dL (ref 8.9–10.3)
Chloride: 103 mmol/L (ref 98–111)
Creatinine, Ser: 1.38 mg/dL — ABNORMAL HIGH (ref 0.61–1.24)
GFR, Estimated: 58 mL/min — ABNORMAL LOW (ref >60)
Glucose, Bld: 103 mg/dL — ABNORMAL HIGH (ref 70–99)
Potassium: 4.1 mmol/L (ref 3.5–5.1)
Sodium: 143 mmol/L (ref 135–145)
Total Bilirubin: 0.3 mg/dL (ref 0.0–1.2)
Total Protein: 7.7 g/dL (ref 6.5–8.1)

## 2023-10-19 LAB — AMMONIA: Ammonia: 62 umol/L — ABNORMAL HIGH (ref 9–35)

## 2023-10-19 LAB — PROTIME-INR
INR: 1 (ref 0.8–1.2)
Prothrombin Time: 14.1 s (ref 11.4–15.2)

## 2023-10-19 LAB — URINALYSIS, W/ REFLEX TO CULTURE (INFECTION SUSPECTED)
Bacteria, UA: NONE SEEN
Bilirubin Urine: NEGATIVE
Glucose, UA: >=500 mg/dL — AB
Hgb urine dipstick: NEGATIVE
Ketones, ur: NEGATIVE mg/dL
Leukocytes,Ua: NEGATIVE
Nitrite: NEGATIVE
Protein, ur: NEGATIVE mg/dL
Specific Gravity, Urine: 1.017 (ref 1.005–1.030)
pH: 5 (ref 5.0–8.0)

## 2023-10-19 LAB — CK
Total CK: 798 U/L — ABNORMAL HIGH (ref 49–397)
Total CK: 803 U/L — ABNORMAL HIGH (ref 49–397)

## 2023-10-19 LAB — BLOOD GAS, VENOUS
Acid-Base Excess: 5.8 mmol/L — ABNORMAL HIGH (ref 0.0–2.0)
Bicarbonate: 32.5 mmol/L — ABNORMAL HIGH (ref 20.0–28.0)
O2 Saturation: 43.4 %
Patient temperature: 37
pCO2, Ven: 55 mmHg (ref 44–60)
pH, Ven: 7.38 (ref 7.25–7.43)
pO2, Ven: <31 mmHg — CL (ref 32–45)

## 2023-10-19 LAB — HEMOGLOBIN A1C
Hgb A1c MFr Bld: 7.6 % — ABNORMAL HIGH (ref 4.8–5.6)
Mean Plasma Glucose: 171.42 mg/dL

## 2023-10-19 LAB — PROCALCITONIN: Procalcitonin: >150 ng/mL

## 2023-10-19 LAB — HIV ANTIBODY (ROUTINE TESTING W REFLEX): HIV Screen 4th Generation wRfx: NONREACTIVE

## 2023-10-19 LAB — FOLATE: Folate: 13.4 ng/mL (ref >5.9)

## 2023-10-19 LAB — T4, FREE: Free T4: 0.93 ng/dL (ref 0.61–1.12)

## 2023-10-19 LAB — VITAMIN B12: Vitamin B-12: 622 pg/mL (ref 180–914)

## 2023-10-19 LAB — VALPROIC ACID LEVEL: Valproic Acid Lvl: <10 ug/mL — ABNORMAL LOW (ref 50–100)

## 2023-10-19 LAB — TSH: TSH: 0.491 u[IU]/mL (ref 0.350–4.500)

## 2023-10-19 LAB — ETHANOL: Alcohol, Ethyl (B): <15 mg/dL (ref <15)

## 2023-10-19 LAB — C-REACTIVE PROTEIN: CRP: 0.6 mg/dL (ref <1.0)

## 2023-10-19 MED ORDER — SODIUM CHLORIDE 0.9 % IV SOLN: INTRAVENOUS | Status: DC

## 2023-10-19 MED ORDER — ACETAMINOPHEN 325 MG PO TABS
650.0000 mg | ORAL_TABLET | Freq: Four times a day (QID) | ORAL | Status: DC | PRN
Start: 2023-10-19 — End: 2023-10-29
  Administered 2023-10-27: 650 mg via ORAL
  Filled 2023-10-19: qty 2

## 2023-10-19 MED ORDER — ONDANSETRON HCL 4 MG PO TABS: 4.0000 mg | ORAL_TABLET | Freq: Four times a day (QID) | ORAL | Status: DC | PRN

## 2023-10-19 MED ORDER — INSULIN ASPART 100 UNIT/ML IJ SOLN
0.0000 [IU] | Freq: Every day | INTRAMUSCULAR | Status: DC
  Filled 2023-10-19: qty 0.05

## 2023-10-19 MED ORDER — DEXTROSE-SODIUM CHLORIDE 5-0.9 % IV SOLN: INTRAVENOUS | Status: AC

## 2023-10-19 MED ORDER — METHADONE HCL 10 MG PO TABS
20.0000 mg | ORAL_TABLET | Freq: Two times a day (BID) | ORAL | Status: DC
  Administered 2023-10-19 – 2023-10-29 (×20): 20 mg via ORAL
  Filled 2023-10-19 (×20): qty 2

## 2023-10-19 MED ORDER — LORAZEPAM 2 MG/ML IJ SOLN: 0.5000 mg | Freq: Four times a day (QID) | INTRAMUSCULAR | Status: DC | PRN

## 2023-10-19 MED ORDER — OLANZAPINE 5 MG PO TABS: 7.5000 mg | ORAL_TABLET | Freq: Two times a day (BID) | ORAL | Status: DC

## 2023-10-19 MED ORDER — PANTOPRAZOLE SODIUM 40 MG PO TBEC
40.0000 mg | DELAYED_RELEASE_TABLET | Freq: Two times a day (BID) | ORAL | Status: DC
  Administered 2023-10-19 – 2023-10-29 (×19): 40 mg via ORAL
  Filled 2023-10-19 (×20): qty 1

## 2023-10-19 MED ORDER — SODIUM CHLORIDE 0.9 % IV SOLN
2.0000 g | INTRAVENOUS | Status: DC
  Administered 2023-10-20 – 2023-10-24 (×5): 2 g via INTRAVENOUS
  Filled 2023-10-19 (×6): qty 20

## 2023-10-19 MED ORDER — POLYETHYLENE GLYCOL 3350 17 G PO PACK
17.0000 g | PACK | Freq: Every day | ORAL | Status: DC
  Administered 2023-10-19 – 2023-10-29 (×10): 17 g via ORAL
  Filled 2023-10-19 (×10): qty 1

## 2023-10-19 MED ORDER — HEPARIN SODIUM (PORCINE) 5000 UNIT/ML IJ SOLN
5000.0000 [IU] | Freq: Three times a day (TID) | INTRAMUSCULAR | Status: DC
  Administered 2023-10-19 – 2023-10-29 (×29): 5000 [IU] via SUBCUTANEOUS
  Filled 2023-10-19 (×28): qty 1

## 2023-10-19 MED ORDER — GABAPENTIN 300 MG PO CAPS
300.0000 mg | ORAL_CAPSULE | Freq: Three times a day (TID) | ORAL | Status: DC
  Administered 2023-10-19 – 2023-10-29 (×27): 300 mg via ORAL
  Filled 2023-10-19 (×30): qty 1

## 2023-10-19 MED ORDER — SODIUM CHLORIDE 0.9 % IV SOLN
500.0000 mg | INTRAVENOUS | Status: DC
  Administered 2023-10-20 – 2023-10-22 (×3): 500 mg via INTRAVENOUS
  Filled 2023-10-19 (×4): qty 5

## 2023-10-19 MED ORDER — BISACODYL 10 MG RE SUPP: 10.0000 mg | Freq: Every day | RECTAL | Status: DC | PRN

## 2023-10-19 MED ORDER — SODIUM CHLORIDE 0.9 % IV SOLN
500.0000 mg | Freq: Once | INTRAVENOUS | Status: AC
  Administered 2023-10-19: 500 mg via INTRAVENOUS
  Filled 2023-10-19: qty 5

## 2023-10-19 MED ORDER — DIVALPROEX SODIUM 125 MG PO CSDR
125.0000 mg | DELAYED_RELEASE_CAPSULE | Freq: Two times a day (BID) | ORAL | Status: DC
  Administered 2023-10-19 – 2023-10-29 (×19): 125 mg via ORAL
  Filled 2023-10-19 (×21): qty 1

## 2023-10-19 MED ORDER — ONDANSETRON HCL 4 MG/2ML IJ SOLN: 4.0000 mg | Freq: Four times a day (QID) | INTRAMUSCULAR | Status: DC | PRN

## 2023-10-19 MED ORDER — SODIUM CHLORIDE 0.9 % IV SOLN
2.0000 g | Freq: Once | INTRAVENOUS | Status: AC
  Administered 2023-10-19: 2 g via INTRAVENOUS
  Filled 2023-10-19: qty 20

## 2023-10-19 MED ORDER — ACETAMINOPHEN 650 MG RE SUPP: 650.0000 mg | Freq: Four times a day (QID) | RECTAL | Status: DC | PRN

## 2023-10-19 MED ORDER — TAMSULOSIN HCL 0.4 MG PO CAPS
0.4000 mg | ORAL_CAPSULE | Freq: Every day | ORAL | Status: DC
Start: 2023-10-19 — End: 2023-10-29
  Administered 2023-10-19 – 2023-10-28 (×10): 0.4 mg via ORAL
  Filled 2023-10-19 (×10): qty 1

## 2023-10-19 MED ORDER — INSULIN ASPART 100 UNIT/ML IJ SOLN
0.0000 [IU] | Freq: Three times a day (TID) | INTRAMUSCULAR | Status: DC
  Filled 2023-10-19: qty 0.15

## 2023-10-19 MED ORDER — ESCITALOPRAM OXALATE 10 MG PO TABS
20.0000 mg | ORAL_TABLET | Freq: Every day | ORAL | Status: DC
  Administered 2023-10-19 – 2023-10-29 (×11): 20 mg via ORAL
  Filled 2023-10-19 (×11): qty 2

## 2023-10-19 MED ADMIN — Lactated Ringer's Solution: 1000 mL | INTRAVENOUS | NDC 00338011704

## 2023-10-19 NOTE — ED Notes (Signed)
 Pt CBG 63 Provider notified. Pt given 2 cups of Orange Juice. Recheck in 15 minutes

## 2023-10-19 NOTE — ED Triage Notes (Signed)
 Pt BIB EMS from Home due to weakness. Pt wife called EMS after worsening weakness for the past 2 weeks. Hx of Dementia. Pt had normal routine but recently moved resulting in wondering and more confusion. Wife requesting help with resource, she's having a hard time caring for pt.  CBG 132 300 mL LR 18 ga LAC BP initial 76/48; 83/57 after fluids SpO2 96% RA

## 2023-10-19 NOTE — ED Notes (Signed)
Pt passed swallow screening.

## 2023-10-19 NOTE — ED Provider Notes (Signed)
 [place] EMERGENCY DEPARTMENT AT [facility] Provider Note   CSN: [id number] Arrival date & time: 10/19/23  9264     History Chief Complaint  Patient presents with   Weakness   Altered Mental Status    HPI: Lucas White is a 63 y.o. male with history pertinent for T2DM, chronic back pain, hypokalemia, chronic pain, PTSD, dementia with behavioral disturbance who presents complaining of altered mental status, weakness. Patient arrived via EMS from home.  History provided by EMS.  No interpreter required during this encounter.  Patient's wife called EMS due to reported weakness and confusion.  Wife not at bedside, per EMS the patient and wife recently moved to a new house, and since then patient has worsened from baseline over the past 2 weeks, typically is able to do some chores around the house such as washing dishes, however lately has been wandering more, less active, reportedly confused, however baseline not clear.  When asked his name, if he is having pain, etc., patient perseverates on the word paramedic, presumably reading the badge of the paramedic in the room, patient unable to abide additional history.  Patient reportedly had initial low blood pressures with systolic in the 70s and route which improved with 300 cc of crystalloids.  Normal blood glucose en route.  Patient's recorded medical, surgical, social, medication list and allergies were reviewed in the Snapshot window as part of the initial history.   Prior to Admission medications   Medication Sig Start Date End Date Taking? Authorizing Provider  Cholecalciferol  (VITAMIN D3) 25 MCG (1000 UT) CAPS Take 1,000 Units by mouth daily.    [provider]  cyclobenzaprine  (FLEXERIL ) 10 MG tablet Take 5 mg by mouth at bedtime as needed (for pain).    [provider]  diclofenac  Sodium (VOLTAREN ) 1 % GEL Apply 4 g topically 3 (three) times daily as needed (for pain- affected areas).    [provider]  divalproex  (DEPAKOTE  SPRINKLE) 125 MG capsule Take 1 capsule (125 mg total) by mouth 3 (three) times daily. 04/30/23 10/19/23  Neysa Caron JINNY, DO  Empagliflozin -Linaglip-Metform 25-05-998 MG TB24 Take 1 tablet by mouth every evening.    [provider]  escitalopram  (LEXAPRO ) 20 MG tablet Take 20 mg by mouth daily.    [provider]  gabapentin  (NEURONTIN ) 300 MG capsule Take 600 mg by mouth 4 (four) times daily.    [provider]  glipiZIDE  (GLUCOTROL ) 5 MG tablet Take 5 mg by mouth 2 (two) times daily before a meal.    [provider]  insulin  glargine (LANTUS ) 100 UNIT/ML Solostar Pen Inject 20 Units into the skin daily in the afternoon.    [provider]  lidocaine (LIDODERM) 5 % Place 1 patch onto the skin daily as needed (for pain- Remove & Discard patch within 12 hours or as directed by MD).    [provider]  linagliptin  (TRADJENTA ) 5 MG TABS tablet Take 1 tablet (5 mg total) by mouth daily with breakfast. 05/01/23 10/19/23  Neysa Caron JINNY, DO  loperamide  (IMODIUM ) 2 MG capsule Take 2 mg by mouth daily as needed for diarrhea or loose stools.    [provider]  melatonin 3 MG TABS tablet Take 6 mg by mouth at bedtime.    [provider]  metformin  (FORTAMET ) 1000 MG (OSM) 24 hr tablet Take 1 tablet (1,000 mg total) by mouth daily with breakfast. 05/01/23 10/19/23  Neysa Caron JINNY, DO  methadone  (DOLOPHINE ) 10  MG tablet Take 10 mg by mouth 4 (four) times daily. 08/05/21   [provider]  OLANZapine  (ZYPREXA ) 7.5 MG tablet Take 1 tablet (7.5 mg total) by mouth 2 (two) times daily. 04/30/23 10/19/23  Neysa Caron PARAS, DO  pantoprazole  (PROTONIX ) 40 MG tablet Take 40 mg by mouth 2 (two) times daily before a meal.    [provider]  pravastatin  (PRAVACHOL ) 40 MG tablet Take 40 mg by mouth at bedtime.    [provider]  QUEtiapine  (SEROQUEL ) 25 MG tablet Take 1 tablet (25 mg total)  by mouth at bedtime. 04/30/23 10/19/23  Neysa Caron PARAS, DO  sitaGLIPtin (JANUVIA) 50 MG tablet Take 50 mg by mouth daily.    [provider]  tamsulosin  (FLOMAX ) 0.4 MG CAPS capsule Take 0.4 mg by mouth at bedtime.    [provider]     Allergies: Patient has no known allergies.   Review of Systems   ROS as per HPI  Physical Exam Updated Vital Signs BP (!) 142/107   Pulse 69   Temp 97.6 F (36.4 C) (Oral)   Resp 19   SpO2 99%  Physical Exam Vitals and nursing note reviewed.  Constitutional:      General: He is not in acute distress.    Appearance: He is well-developed.  HENT:     Head: Normocephalic and atraumatic.  Eyes:     Conjunctiva/sclera: Conjunctivae normal.  Cardiovascular:     Rate and Rhythm: Normal rate and regular rhythm.     Heart sounds: No murmur heard. Pulmonary:     Effort: Pulmonary effort is normal. No respiratory distress.     Breath sounds: Normal breath sounds.  Abdominal:     Palpations: Abdomen is soft.     Tenderness: There is no abdominal tenderness.  Musculoskeletal:        General: No swelling.     Cervical back: Neck supple. No bony tenderness.     Thoracic back: No tenderness or bony tenderness.     Lumbar back: No tenderness or bony tenderness.  Skin:    General: Skin is warm and dry.     Capillary Refill: Capillary refill takes less than 2 seconds.  Neurological:     Mental Status: He is alert.     GCS: GCS eye subscore is 3. GCS verbal subscore is 3. GCS motor subscore is 6.  Psychiatric:        Mood and Affect: Mood normal.     ED Course/ Medical Decision Making/ A&P    Procedures Procedures   Medications Ordered in ED Medications  divalproex  (DEPAKOTE  SPRINKLE) capsule 125 mg (has no administration in time range)  escitalopram  (LEXAPRO ) tablet 20 mg (has no administration in time range)  gabapentin  (NEURONTIN ) capsule 300 mg (has no administration in time range)  methadone  (DOLOPHINE ) tablet 20 mg  (has no administration in time range)  pantoprazole  (PROTONIX ) EC tablet 40 mg (has no administration in time range)  tamsulosin  (FLOMAX ) capsule 0.4 mg (has no administration in time range)  insulin  aspart (novoLOG ) injection 0-15 Units (has no administration in time range)  insulin  aspart (novoLOG ) injection 0-5 Units (has no administration in time range)  heparin injection 5,000 Units (has no administration in time range)  acetaminophen  (TYLENOL ) tablet 650 mg (has no administration in time range)    Or  acetaminophen  (TYLENOL ) suppository 650 mg (has no administration in time range)  ondansetron  (ZOFRAN ) tablet 4 mg (has no administration in time range)  Or  ondansetron  (ZOFRAN ) injection 4 mg (has no administration in time range)  bisacodyl (DULCOLAX) suppository 10 mg (has no administration in time range)  polyethylene glycol (MIRALAX  / GLYCOLAX ) packet 17 g (has no administration in time range)  0.9 %  sodium chloride  infusion (has no administration in time range)  LORazepam  (ATIVAN ) injection 0.5 mg (has no administration in time range)  cefTRIAXone  (ROCEPHIN ) 2 g in sodium chloride  0.9 % 100 mL IVPB (has no administration in time range)  azithromycin  (ZITHROMAX ) 500 mg in sodium chloride  0.9 % 250 mL IVPB (has no administration in time range)  lactated ringers  bolus 1,000 mL (0 mLs Intravenous Stopped 10/19/23 1506)  cefTRIAXone  (ROCEPHIN ) 2 g in sodium chloride  0.9 % 100 mL IVPB (0 g Intravenous Stopped 10/19/23 1647)  azithromycin  (ZITHROMAX ) 500 mg in sodium chloride  0.9 % 250 mL IVPB (0 mg Intravenous Stopped 10/19/23 1647)    Medical Decision Making:   EINER MEALS is a 63 y.o. male who presents for weakness, altered mental status as per above.  Physical exam is pertinent for GCS 12 (3, 3, 6).   The differential includes but is not limited to ICH, stroke, metabolic encephalopathy, hypoglycemia, sepsis, rhabdomyolysis, dementia.  Independent historian: Spouse/partner  and EMS  External data reviewed: Labs: Reviewed prior labs for baseline  Labs: Ordered, Independent interpretation, and Details: CBC without leukocytosis, stable anemia.  No thrombocytopenia. CMP with elevated creatinine, at greater than baseline, though patient without symmetric BUN elevation, thus this may be a developing chronic change.  No emergent electrolyte derangement or emergent LFT abnormality.  CK elevated at 798, delta 103.  UA without UTI.  Lactic acid WNL  Radiology: Ordered, Independent interpretation, Details: Chest x-ray with increased streaky opacifications in the bilateral bases concerning for possible pneumonia, no cardiomediastinal silhouette derangement, pneumothorax, pleural effusion, bony derangement.  CT head without ICH, displaced fracture, MLS, loss of recess white matter differentiation., and All images reviewed independently.  Agree with radiology report at this time.   CT Head Wo Contrast Result Date: 10/19/2023 EXAM: CT HEAD WITHOUT CONTRAST 10/19/2023 09:40:48 AM TECHNIQUE: CT of the head was performed without the administration of intravenous contrast. Automated exposure control, iterative reconstruction, and/or weight based adjustment of the mA/kV was utilized to reduce the radiation dose to as low as reasonably achievable. COMPARISON: Head CT 04/29/2023. CLINICAL HISTORY: 63 year old male with mental status change, 2 weeks of worsening weakness, and history of dementia. Wife reports increased confusion. FINDINGS: BRAIN AND VENTRICLES: No acute hemorrhage. No evidence of acute infarct. No hydrocephalus. No extra-axial collection. No mass effect or midline shift. Brain volume is stable and normal for age. Calcified atherosclerosis at the skull base. No suspicious intracranial vascular hyperdensity. ORBITS: No acute abnormality. Chronic bilateral lamina papyracea fractures are stable. SINUSES: Mild chronic paranasal sinus mucosal thickening is stable. Small right fontal sinus  osteoma (benign). SOFT TISSUES AND SKULL: Small chronic right anterior vertex scalp lipoma is benign series 4 image 37. No acute soft tissue abnormality. No skull fracture. MIDDLE EARS AND MASTOIDS: Middle ears and mastoids well aerated. IMPRESSION: 1. No acute intracranial abnormality. Stable and normal for age noncontrast CT appearance of the brain. Electronically signed by: Helayne Hurst MD 10/19/2023 09:47 AM EDT RP Workstation: [id number]   DG Chest Port 1 View Result Date: 10/19/2023 EXAM: 1 VIEW(S) XRAY OF THE CHEST 10/19/2023 08:51:00 AM COMPARISON: AV radiograph of the chest dated 10/27/2021. CLINICAL HISTORY: Questionable sepsis - evaluate for abnormality. Possible sepsis. FINDINGS: LUNGS AND PLEURA: Low lung  volumes. Mild diffuse interstitial opacities. No focal pulmonary opacity. No pulmonary edema. No pleural effusion. No pneumothorax. HEART AND MEDIASTINUM: Enlarged cardiomediastinal silhouette, likely due to technique and rotation. BONES AND SOFT TISSUES: No acute osseous abnormality. IMPRESSION: 1. Mild diffuse interstitial opacities, possibly related to sepsis. 2. Enlarged cardiomediastinal silhouette, likely due to technique and rotation. Electronically signed by: Evalene Coho MD 10/19/2023 09:06 AM EDT RP Workstation: [id number]    EKG/Medicine tests: Ordered and Independent interpretation EKG Interpretation: Right and left arm electrode reversal, interpretation assumes no reversal Sinus rhythm Probable lateral infarct, age indeterminate Confirmed by Rogelia Satterfield ([id number]) on 10/19/2023 5:19:44 PM  Interventions:LR bolus, ceftriaxone , azithromycin    See the EMR for full details regarding lab and imaging results.  On exam, patient is altered, though normotensive and vitally stable.  Prior to arrival, patient was hypotensive though improved with fluids, therefore screening sepsis labs obtained upon arrival.  Discussed with patient's wife over the phone, who reports that at  baseline the patient has profound aphasia which has been previously attributed to his frontal dementia, however he is usually verbal, and is able to communicate his needs to her, however over the past several days he has become less verbal, and has been asking where his wife is, and not recognizing that she is his wife.  Reports that he is also not performing his activities as usual, typically washes the dishes, however lately has been turning on the stove and leaving the sink running without completing any activities, and altered mental status was even worse today.  Denies any recent trauma or falls or specific sick symptoms.  Broad workup does reveal elevated CK, which up trended after fluids, does not rise quite to the level of rhabdomyolysis, however it is concerning for underlying increased muscle turnover.  Otherwise metabolic panel and CBC reassuring, UA without UTI, though chest x-ray with possible pneumonia, therefore patient started on ceftriaxone  and azithromycin .  Given patient altered from baseline in the acute setting, elevated CK, and possible pneumonia, do feel that patient warrants admission, hospitalist consulted, and discussed with Dr. Tobie who accepted the patient to his service.  Presentation is most consistent with acute complicated illness  Discussion of management or test interpretations with external provider(s): Dr. Tobie, hospitalists  Risk Drugs:Prescription drug management Treatment: Decision regarding hospitalization  Disposition: ADMIT: I believe the patient requires admission for further care and management. The patient was admitted to hospitalists. Please see inpatient provider note for additional treatment plan details.   MDM generated using voice dictation software and may contain dictation errors.  Please contact me for any clarification or with any questions.  Clinical Impression:  1. Altered mental status, unspecified altered mental status type   2. Pneumonia  of both lungs due to infectious organism, unspecified part of lung      Admit   Final Clinical Impression(s) / ED Diagnoses Final diagnoses:  Altered mental status, unspecified altered mental status type  Pneumonia of both lungs due to infectious organism, unspecified part of lung    Rx / DC Orders ED Discharge Orders     None        Rogelia Satterfield RAMAN, MD 10/19/23 1723

## 2023-10-19 NOTE — Progress Notes (Addendum)
 Pt was not in room, OTF to transport, not available.

## 2023-10-19 NOTE — ED Notes (Signed)
 [facility] initiated, pending arrival.

## 2023-10-19 NOTE — Progress Notes (Signed)
 Patient not available for EEG due to being at imaging, nightshift EEG tech will try back later as schedule allows.

## 2023-10-19 NOTE — ED Notes (Signed)
 Pt alert, NAD, calm, interactive, sitting upright, resps e/u, speaking clearly. [facility] at [facility] for transport to [facility]. Dinner never received d/t trip to b/r and transport.

## 2023-10-19 NOTE — ED Notes (Signed)
 Pt tolerated OJ well

## 2023-10-19 NOTE — ED Notes (Signed)
 Admitting MD at [facility]. Pt to be transferred to [facility]. Pending MRI and EEG.

## 2023-10-19 NOTE — H&P (Addendum)
 History and Physical  Patient: Lucas White FMW:[id number] DOB: January 17, 1960 DOA: 10/19/2023 DOS: the patient was seen and examined on 10/19/2023 Patient coming from: Home  Chief Complaint: Confusion and a fall  HPI: The patient with PMH of type II DM, bipolar disorder, dementia with behavioral issues with psychosis, HLD, neuropathy, BPH, PTSD presents the hospital with complaints of confusion and a fall. History is taken from wife and ED provider as the patient is unable to provide any history. At the time of my evaluation patient denies any complaints of headache, dizziness, blurred vision, chest pain, abdominal pain.  Denies any nausea or vomiting.  Denies any diarrhea.  Able to tell me his name able to tell me that he is in the hospital.  Able to follow commands.  For the last 1 week patient has been having progressive generalized weakness.  Difficulty ambulating. Wife also reports that the patient has not been eating well for last 1 week. Patient also had episodes of incontinence ongoing for last 1 week. No falls reported until this morning when he was going to the TV and suddenly lost his balance and fell on the ground. Denies any head injury or neck injury. By also denies any passing out event. Wife reports that patient has not had any change in his medication in recent time but a month ago his sitagliptin was increased to 100 mg due to uncontrolled diabetes. Wife reports patient is compliant with all his medications. Denies any alcohol abuse or drug abuse. No recent fever or chills reported as well. No recent seizures reported. Seen his psych care 2 months ago. In the ED his CT head was unremarkable.  Had mild elevation of CK.  There was concern for a possible pneumonia and the patient was recommended for admission.  Assessment and plan. Acute metabolic encephalopathy Presents with generalized weakness.  Progressively worsening over last 1 to 2 weeks. Etiology is currently not  clear and differential is wide. Workup so far includes a CT scan of the head which is negative for any acute abnormality, chest x-ray which shows possible pneumonia although I suspect this is more volume overload, UA which is unremarkable, VBG shows hypoxia without any hypercarbia, lactic acid which is normal, CK which is minimally elevated, and mild elevation of serum creatinine not meeting AKI criteria. Severely hypoglycemic upon arrival. Reportedly was hypotensive when EMS arrived and received some IV fluid.  Will get MRI brain to rule out stroke. Will check EEG to rule out a seizure event. Check metabolic workup including TSH, free T4, ammonia, B12, folic acid , Depakote  level, procalcitonin, CRP, ethanol. Continue treatment for community-acquired pneumonia. Pending workup we may need to consult neurology. There is also concern for possible neuroleptic malignant syndrome and therefore we will be holding Zyprexa  and Seroquel . Use as needed Ativan . Psychiatry consulted. Patient is on gabapentin  which will be reduced based on the renal function. Continue other psychotropic medication as well as pain medication.  Addendum: Will transfer the patient to [facility] given lack of EEG over the weekend here at [facility].  Yetta Blanch 3:39 PM 10/19/2023    Fall at home, initial encounter PT OT consulted. So far no focal deficit. Likely related to his rigidity. Also could be related to orthostatic with hypotension. Receiving IV fluid.  Monitor.  MRI brain.  Elevated CK Rigidity Provide the patient walks without any assistance at his baseline. Does not have any significant rigidity. CK is minimally elevated. Currently does not meet criteria for NMS.  Will hold antipsychotic medication and monitor. Follow-up in psychiatry consultation. Gentle IV hydration. Robaxin  as needed.  Pedal edema Acute in nature per wife. For now we will monitor as the patient is receiving IV  hydration. May require diuresis later if further worsens. Check lower extremity Doppler.  Psychosis PTSD Aggressive behavior, adult Dementia with behavioral disturbance Not on any dementia medication. Continue antidepressant with Lexapro . Holding antipsychotic medication as above. Psychiatry consult.  Chronic pain syndrome  On methadone  20 mg twice daily. For now we will continue. Gabapentin  dose reduced as above from 600 mg 4 times daily to 300 mg 3 times daily based on confusion.  Type 2 diabetes mellitus with uncontrolled with hyperglycemia, with long-term current use of insulin  Hemoglobin A1c was 7.6  2 years ago. Will recheck hemoglobin A1c. Home regimen includes metformin  1000, empagliflozin , sitagliptin, Lantus  20 units. For now we will only continue sliding scale due to presentation with hypoglycemia-  Obesity (BMI 30.0-34.9) Placing the patient at high risk of poor outcome. ABG does not show any significant hypercarbia.  GERD. Continue PPI.  BPH. Continue Flomax .  Advance Care Planning:   Code Status: Full Code discussed with wife on the phone. Consults: none  Prior to Admission medications   Medication Sig Start Date End Date Taking? Authorizing Provider  Cholecalciferol  (VITAMIN D3) 25 MCG (1000 UT) CAPS Take 1,000 Units by mouth daily.    [provider]  cyclobenzaprine  (FLEXERIL ) 10 MG tablet Take 5 mg by mouth at bedtime as needed (for pain).    [provider]  diclofenac  Sodium (VOLTAREN ) 1 % GEL Apply 4 g topically 3 (three) times daily as needed (for pain- affected areas).    [provider]  divalproex  (DEPAKOTE  SPRINKLE) 125 MG capsule Take 1 capsule (125 mg total) by mouth 3 (three) times daily. 04/30/23 05/30/23  Neysa Caron PARAS, DO  Empagliflozin -Linaglip-Metform 25-05-998 MG TB24 Take 1 tablet by mouth every evening.    [provider]  escitalopram  (LEXAPRO ) 20 MG tablet Take 20 mg by mouth daily.    [provider]  gabapentin  (NEURONTIN ) 300 MG capsule Take 600 mg by mouth 4 (four) times daily.    [provider]  glipiZIDE  (GLUCOTROL ) 5 MG tablet Take 5 mg by mouth 2 (two) times daily before a meal.    [provider]  insulin  glargine (LANTUS ) 100 UNIT/ML Solostar Pen Inject 20 Units into the skin daily in the afternoon.    [provider]  lidocaine (LIDODERM) 5 % Place 1 patch onto the skin daily as needed (for pain- Remove & Discard patch within 12 hours or as directed by MD).    [provider]  linagliptin  (TRADJENTA ) 5 MG TABS tablet Take 1 tablet (5 mg total) by mouth daily with breakfast. 05/01/23 05/31/23  Neysa Caron PARAS, DO  loperamide  (IMODIUM ) 2 MG capsule Take 2 mg by mouth daily as needed for diarrhea or loose stools.    [provider]  melatonin 3 MG TABS tablet Take 6 mg by mouth at bedtime.    [provider]  metformin  (FORTAMET ) 1000 MG (OSM) 24 hr tablet Take 1 tablet (1,000 mg total) by mouth daily with breakfast. 05/01/23 05/31/23  Neysa Caron PARAS, DO  methadone  (DOLOPHINE ) 10 MG tablet Take 10 mg by mouth 4 (four) times daily. 08/05/21   [provider]  OLANZapine  (ZYPREXA ) 7.5 MG tablet Take 1 tablet (7.5 mg total) by mouth 2 (two) times daily. 04/30/23 05/30/23  Neysa Caron PARAS,  DO  pantoprazole  (PROTONIX ) 40 MG tablet Take 40 mg by mouth 2 (two) times daily before a meal.    [provider]  pravastatin  (PRAVACHOL ) 40 MG tablet Take 40 mg by mouth at bedtime.    [provider]  QUEtiapine  (SEROQUEL ) 25 MG tablet Take 1 tablet (25 mg total) by mouth at bedtime. 04/30/23 05/30/23  Neysa Caron PARAS, DO  sitaGLIPtin (JANUVIA) 50 MG tablet Take 50 mg by mouth daily.    [provider]  tamsulosin  (FLOMAX ) 0.4 MG CAPS capsule Take 0.4 mg by mouth at bedtime.    [provider]    Past Medical History:  Diagnosis Date   Chronic back pain greater than 3 months duration    Obesity  (BMI 30.0-34.9)    Pneumonia due to COVID-19 virus 12/19/2018   Type 2 diabetes mellitus (HCC)    Past Surgical History:  Procedure Laterality Date   BACK SURGERY     COLONOSCOPY     Social History:  reports that he has never smoked. He has never used smokeless tobacco. He reports that he does not drink alcohol and does not use drugs. No Known Allergies Family History  Problem Relation Age of Onset   Diabetes Father    Physical Exam: Vitals:   10/19/23 0845 10/19/23 1115 10/19/23 1119 10/19/23 1156  BP: 115/77 112/83 112/83   Pulse: 71  77   Resp: 16 13 19    Temp:    97.8 F (36.6 C)  TempSrc:    Oral  SpO2: 90%  94%    General: in Mild distress, No Rash Cardiovascular: S1 and S2 Present, No Murmur Respiratory: Good respiratory effort, Bilateral Air entry present.  Bilateral basal crackles, No wheezes Abdomen: Bowel Sound present, No tenderness Extremities: Bilateral edema, no calf tenderness Neuro: Alert and oriented self, after some prompting able to tell me that he is in the hospital, speech clear, not maintaining eye contact, able to follow commands, significant bilateral upper extremity rigidity on examination, normal finger-nose-finger, pupils are equal and reactive to light, extraocular muscle movement intact, no nystagmus, bilateral equal upper extremity strength as well as lower extremity strength. no new focal deficit  No tremors.  Data Reviewed: I have reviewed ED notes, Vitals, Lab results and outpatient records. Since last encounter, pertinent lab results CBC and BMP   . I have ordered test including CBC, BMP, Depakote  level, ethanol, procalcitonin, CRP, ESR, TSH, free T4, folic acid , B12, ammonia, HIV, hemoglobin A1c  . I have discussed pt's care plan and test results with ED provider  . I have ordered imaging MRI brain and EEG  .   Family Communication: Discussed with wife on the phone  Author: Yetta Blanch, MD 10/19/2023 3:19 PM For on call review  [email or web address].

## 2023-10-19 NOTE — Progress Notes (Signed)
 EEG complete - results pending

## 2023-10-20 DIAGNOSIS — W1830XA Fall on same level, unspecified, initial encounter: Secondary | ICD-10-CM | POA: Diagnosis present

## 2023-10-20 DIAGNOSIS — F03911 Unspecified dementia, unspecified severity, with agitation: Secondary | ICD-10-CM | POA: Diagnosis present

## 2023-10-20 DIAGNOSIS — F909 Attention-deficit hyperactivity disorder, unspecified type: Secondary | ICD-10-CM | POA: Diagnosis present

## 2023-10-20 DIAGNOSIS — R7989 Other specified abnormal findings of blood chemistry: Secondary | ICD-10-CM

## 2023-10-20 DIAGNOSIS — Z794 Long term (current) use of insulin: Secondary | ICD-10-CM | POA: Diagnosis not present

## 2023-10-20 DIAGNOSIS — Z8616 Personal history of COVID-19: Secondary | ICD-10-CM | POA: Diagnosis not present

## 2023-10-20 DIAGNOSIS — Y92008 Other place in unspecified non-institutional (private) residence as the place of occurrence of the external cause: Secondary | ICD-10-CM | POA: Diagnosis not present

## 2023-10-20 DIAGNOSIS — R748 Abnormal levels of other serum enzymes: Secondary | ICD-10-CM | POA: Diagnosis not present

## 2023-10-20 DIAGNOSIS — N400 Enlarged prostate without lower urinary tract symptoms: Secondary | ICD-10-CM | POA: Diagnosis present

## 2023-10-20 DIAGNOSIS — M7989 Other specified soft tissue disorders: Secondary | ICD-10-CM | POA: Diagnosis not present

## 2023-10-20 DIAGNOSIS — F319 Bipolar disorder, unspecified: Secondary | ICD-10-CM | POA: Diagnosis present

## 2023-10-20 DIAGNOSIS — E785 Hyperlipidemia, unspecified: Secondary | ICD-10-CM | POA: Diagnosis present

## 2023-10-20 DIAGNOSIS — R569 Unspecified convulsions: Secondary | ICD-10-CM | POA: Diagnosis not present

## 2023-10-20 DIAGNOSIS — E669 Obesity, unspecified: Secondary | ICD-10-CM | POA: Diagnosis present

## 2023-10-20 DIAGNOSIS — G894 Chronic pain syndrome: Secondary | ICD-10-CM | POA: Diagnosis present

## 2023-10-20 DIAGNOSIS — E11649 Type 2 diabetes mellitus with hypoglycemia without coma: Secondary | ICD-10-CM | POA: Diagnosis present

## 2023-10-20 DIAGNOSIS — G9341 Metabolic encephalopathy: Secondary | ICD-10-CM | POA: Diagnosis present

## 2023-10-20 DIAGNOSIS — F05 Delirium due to known physiological condition: Secondary | ICD-10-CM | POA: Diagnosis present

## 2023-10-20 DIAGNOSIS — Z781 Physical restraint status: Secondary | ICD-10-CM | POA: Diagnosis not present

## 2023-10-20 DIAGNOSIS — Z1152 Encounter for screening for COVID-19: Secondary | ICD-10-CM | POA: Diagnosis not present

## 2023-10-20 DIAGNOSIS — F4310 Post-traumatic stress disorder, unspecified: Secondary | ICD-10-CM | POA: Diagnosis present

## 2023-10-20 DIAGNOSIS — F0393 Unspecified dementia, unspecified severity, with mood disturbance: Secondary | ICD-10-CM | POA: Diagnosis present

## 2023-10-20 DIAGNOSIS — R4182 Altered mental status, unspecified: Secondary | ICD-10-CM | POA: Diagnosis present

## 2023-10-20 DIAGNOSIS — Z79899 Other long term (current) drug therapy: Secondary | ICD-10-CM | POA: Diagnosis not present

## 2023-10-20 DIAGNOSIS — F03918 Unspecified dementia, unspecified severity, with other behavioral disturbance: Secondary | ICD-10-CM | POA: Diagnosis present

## 2023-10-20 DIAGNOSIS — E1165 Type 2 diabetes mellitus with hyperglycemia: Secondary | ICD-10-CM | POA: Diagnosis present

## 2023-10-20 DIAGNOSIS — J159 Unspecified bacterial pneumonia: Secondary | ICD-10-CM | POA: Diagnosis present

## 2023-10-20 DIAGNOSIS — G9340 Encephalopathy, unspecified: Secondary | ICD-10-CM

## 2023-10-20 DIAGNOSIS — L89892 Pressure ulcer of other site, stage 2: Secondary | ICD-10-CM | POA: Diagnosis not present

## 2023-10-20 DIAGNOSIS — Z7984 Long term (current) use of oral hypoglycemic drugs: Secondary | ICD-10-CM | POA: Diagnosis not present

## 2023-10-20 DIAGNOSIS — K219 Gastro-esophageal reflux disease without esophagitis: Secondary | ICD-10-CM | POA: Diagnosis present

## 2023-10-20 DIAGNOSIS — J189 Pneumonia, unspecified organism: Secondary | ICD-10-CM | POA: Diagnosis not present

## 2023-10-20 DIAGNOSIS — E1142 Type 2 diabetes mellitus with diabetic polyneuropathy: Secondary | ICD-10-CM | POA: Diagnosis present

## 2023-10-20 LAB — CBC WITH DIFFERENTIAL/PLATELET
Abs Immature Granulocytes: 0.01 K/uL (ref 0.00–0.07)
Basophils Absolute: 0 K/uL (ref 0.0–0.1)
Basophils Relative: 1 %
Eosinophils Absolute: 0.3 K/uL (ref 0.0–0.5)
Eosinophils Relative: 6 %
HCT: 33.6 % — ABNORMAL LOW (ref 39.0–52.0)
Hemoglobin: 11.1 g/dL — ABNORMAL LOW (ref 13.0–17.0)
Immature Granulocytes: 0 %
Lymphocytes Relative: 57 %
Lymphs Abs: 3 K/uL (ref 0.7–4.0)
MCH: 28.9 pg (ref 26.0–34.0)
MCHC: 33 g/dL (ref 30.0–36.0)
MCV: 87.5 fL (ref 80.0–100.0)
Monocytes Absolute: 0.4 K/uL (ref 0.1–1.0)
Monocytes Relative: 7 %
Neutro Abs: 1.5 K/uL — ABNORMAL LOW (ref 1.7–7.7)
Neutrophils Relative %: 29 %
Platelets: 349 K/uL (ref 150–400)
RBC: 3.84 MIL/uL — ABNORMAL LOW (ref 4.22–5.81)
RDW: 12.6 % (ref 11.5–15.5)
WBC: 5.3 K/uL (ref 4.0–10.5)
nRBC: 0 % (ref 0.0–0.2)

## 2023-10-20 LAB — GLUCOSE, CAPILLARY
Glucose-Capillary: 91 mg/dL (ref 70–99)
Glucose-Capillary: 101 mg/dL — ABNORMAL HIGH (ref 70–99)
Glucose-Capillary: 173 mg/dL — ABNORMAL HIGH (ref 70–99)
Glucose-Capillary: 174 mg/dL — ABNORMAL HIGH (ref 70–99)

## 2023-10-20 LAB — COMPREHENSIVE METABOLIC PANEL WITH GFR
ALT: 11 U/L (ref 0–44)
AST: 23 U/L (ref 15–41)
Albumin: 3.3 g/dL — ABNORMAL LOW (ref 3.5–5.0)
Alkaline Phosphatase: 75 U/L (ref 38–126)
Anion gap: 11 (ref 5–15)
BUN: 5 mg/dL — ABNORMAL LOW (ref 8–23)
CO2: 27 mmol/L (ref 22–32)
Calcium: 8.9 mg/dL (ref 8.9–10.3)
Chloride: 103 mmol/L (ref 98–111)
Creatinine, Ser: 1.06 mg/dL (ref 0.61–1.24)
GFR, Estimated: >60 mL/min (ref >60)
Glucose, Bld: 94 mg/dL (ref 70–99)
Potassium: 3.6 mmol/L (ref 3.5–5.1)
Sodium: 141 mmol/L (ref 135–145)
Total Bilirubin: 0.8 mg/dL (ref 0.0–1.2)
Total Protein: 6.5 g/dL (ref 6.5–8.1)

## 2023-10-20 LAB — RESPIRATORY PANEL BY PCR

## 2023-10-20 LAB — RESP PANEL BY RT-PCR (RSV, FLU A&B, COVID)  RVPGX2
Influenza A by PCR: NEGATIVE
Influenza B by PCR: NEGATIVE
Resp Syncytial Virus by PCR: NEGATIVE
SARS Coronavirus 2 by RT PCR: NEGATIVE

## 2023-10-20 LAB — MAGNESIUM: Magnesium: 1.4 mg/dL — ABNORMAL LOW (ref 1.7–2.4)

## 2023-10-20 LAB — BRAIN NATRIURETIC PEPTIDE: B Natriuretic Peptide: 17.9 pg/mL (ref 0.0–100.0)

## 2023-10-20 LAB — CK: Total CK: 786 U/L — ABNORMAL HIGH (ref 49–397)

## 2023-10-20 MED ORDER — OLANZAPINE 10 MG IM SOLR
2.5000 mg | Freq: Two times a day (BID) | INTRAMUSCULAR | Status: DC | PRN
  Administered 2023-10-20 – 2023-10-23 (×5): 2.5 mg via INTRAMUSCULAR
  Filled 2023-10-20 (×8): qty 10

## 2023-10-20 MED ORDER — STERILE WATER FOR INJECTION IJ SOLN
INTRAMUSCULAR | Status: AC
  Filled 2023-10-20: qty 10

## 2023-10-20 MED ORDER — MAGNESIUM SULFATE 2 GM/50ML IV SOLN
2.0000 g | Freq: Once | INTRAVENOUS | Status: AC
  Administered 2023-10-20: 2 g via INTRAVENOUS
  Filled 2023-10-20: qty 50

## 2023-10-20 MED ORDER — QUETIAPINE FUMARATE 25 MG PO TABS
25.0000 mg | ORAL_TABLET | Freq: Every day | ORAL | Status: DC
  Administered 2023-10-20 – 2023-10-21 (×2): 25 mg via ORAL
  Filled 2023-10-20 (×2): qty 1

## 2023-10-20 NOTE — Procedures (Signed)
 Patient Name: CARLSON BELLAND  MRN: [id number]  Epilepsy Attending: Arlin MALVA Krebs  Referring Physician/Provider: Tobie Yetta HERO, MD  Date: 10/19/2023 Duration: 22.28 mins  Patient history: 63yo M with ams. EEG to evaluate for seizure  Level of alertness: Awake  AEDs during EEG study: VPA, GBP  Technical aspects: This EEG study was done with scalp electrodes positioned according to the 10-20 International system of electrode placement. Electrical activity was reviewed with band pass filter of 1-70Hz , sensitivity of 7 uV/mm, display speed of 46mm/sec with a 60Hz  notched filter applied as appropriate. EEG data were recorded continuously and digitally stored.  Video monitoring was available and reviewed as appropriate.  Description: The posterior dominant rhythm consists of 7 Hz activity of moderate voltage (25-35 uV) seen predominantly in posterior head regions, symmetric and reactive to eye opening and eye closing. EEG showed continuous generalized 5 to 7 Hz theta slowing admixed with intermittent 2-3hz  delta slowing. Hyperventilation and photic stimulation were not performed.     ABNORMALITY - Continuous slow, generalized  IMPRESSION: This study is suggestive of generalized cerebral dysfunction (encephalopathy). No seizures or epileptiform discharges were seen throughout the recording.  Edvardo Honse O Sharina Petre
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
## 2023-10-20 NOTE — Plan of Care (Signed)
  Problem: Clinical Measurements: Goal: Ability to maintain clinical measurements within normal limits will improve Outcome: Progressing Goal: Will remain free from infection Outcome: Progressing Goal: Diagnostic test results will improve Outcome: Progressing Goal: Respiratory complications will improve Outcome: Progressing Goal: Cardiovascular complication will be avoided Outcome: Progressing   Problem: Nutrition: Goal: Adequate nutrition will be maintained Outcome: Progressing   Problem: Activity: Goal: Risk for activity intolerance will decrease Outcome: Progressing   Problem: Pain Managment: Goal: General experience of comfort will improve and/or be controlled Outcome: Progressing   Problem: Elimination: Goal: Will not experience complications related to bowel motility Outcome: Progressing Goal: Will not experience complications related to urinary retention Outcome: Progressing   Problem: Safety: Goal: Ability to remain free from injury will improve Outcome: Progressing   Problem: Skin Integrity: Goal: Risk for impaired skin integrity will decrease Outcome: Progressing

## 2023-10-20 NOTE — Evaluation (Signed)
 Occupational Therapy Evaluation Patient Details Name: Lucas White MRN: [id number] DOB: Jan 22, 1960 Today's Date: 10/20/2023   History of Present Illness   Pt is a 63 y.o. male presenting 10/17 with generalized weakness, difficulty ambulating, incontinence, poor oral intake x 1 week. Found to have elevated CK, CXR with mild diffuse interstitial opacities, possibly related to sepsis. MRI brain negative. EEG showed encephalopathy. PMH: DMII, bipolar disorder, dementia with behavioral disturbance/psychosis, , HLD, neuropathy, PTSD.     Clinical Impressions PTA, pt lived with wife per his report and he was independent for BADL. Pt questionable historian and lacks orientation on evaluation, so unsure reliability of report. Upon eval, pt presents with decreased balance and cognition, however, suspect current cognitive status to be baseline based on history. Pt needing up to set-up for UB ADL and CGA for LB ADL. Pt to continue to benefit from acute OT services.       If plan is discharge home, recommend the following:   A little help with walking and/or transfers;A little help with bathing/dressing/bathroom;Assistance with cooking/housework;Direct supervision/assist for medications management;Direct supervision/assist for financial management;Assist for transportation;Help with stairs or ramp for entrance;Supervision due to cognitive status     Functional Status Assessment   Patient has had a recent decline in their functional status and demonstrates the ability to make significant improvements in function in a reasonable and predictable amount of time.     Equipment Recommendations   Tub/shower seat     Recommendations for Other Services         Precautions/Restrictions   Precautions Precautions: Fall Recall of Precautions/Restrictions: Impaired Restrictions Weight Bearing Restrictions Per Provider Order: No     Mobility Bed Mobility Overal bed mobility: Needs  Assistance Bed Mobility: Supine to Sit     Supine to sit: Supervision     General bed mobility comments: for safety    Transfers Overall transfer level: Needs assistance Equipment used: None Transfers: Sit to/from Stand Sit to Stand: Contact guard assist           General transfer comment: CGA for safety, no physical assist      Balance Overall balance assessment: Needs assistance Sitting-balance support: No upper extremity supported, Feet supported Sitting balance-Leahy Scale: Good     Standing balance support: No upper extremity supported Standing balance-Leahy Scale: Good                             ADL either performed or assessed with clinical judgement   ADL Overall ADL's : Needs assistance/impaired Eating/Feeding: Set up;Sitting   Grooming: Supervision/safety;Standing;Oral care   Upper Body Bathing: Set up;Sitting   Lower Body Bathing: Sit to/from stand;Contact guard assist   Upper Body Dressing : Set up;Sitting   Lower Body Dressing: Contact guard assist;Sit to/from stand   Toilet Transfer: Contact guard assist;Ambulation           Functional mobility during ADLs: Contact guard assist       Vision   Vision Assessment?: Vision impaired- to be further tested in functional context;Yes Tracking/Visual Pursuits: Impaired - to be further tested in functional context (inconsistent tracking) Saccades: Additional eye shifts occurred during testing     Perception         Praxis         Pertinent Vitals/Pain Pain Assessment Pain Assessment: Faces Faces Pain Scale: Hurts a little bit Pain Location: L lateral thigh Pain Descriptors / Indicators: Discomfort Pain Intervention(s): Limited activity within patient's  tolerance     Extremity/Trunk Assessment Upper Extremity Assessment Upper Extremity Assessment: Generalized weakness;Right hand dominant   Lower Extremity Assessment Lower Extremity Assessment: Defer to PT evaluation    Cervical / Trunk Assessment Cervical / Trunk Assessment: Normal   Communication Communication Communication: Impaired Factors Affecting Communication: Difficulty expressing self   Cognition Arousal: Alert Behavior During Therapy: Flat affect Cognition: History of cognitive impairments             OT - Cognition Comments: per chart, pt with dementia with behavioral disturbance; follows one step commands.                 Following commands: Impaired Following commands impaired: Follows one step commands inconsistently, Follows one step commands with increased time     Cueing  General Comments   Cueing Techniques: Verbal cues;Tactile cues;Visual cues      Exercises     Shoulder Instructions      Home Living Family/patient expects to be discharged to:: Private residence Living Arrangements: Spouse/significant other Available Help at Discharge: Family;Available PRN/intermittently Type of Home: House Home Access: Stairs to enter [employer] of Steps: 4 Entrance Stairs-Rails:  (at least one rail, unsure what side) Home Layout: Two level     Bathroom Shower/Tub: Tub/shower unit         Home Equipment: None   Additional Comments: questionable historian, unable to answer all questions      Prior Functioning/Environment Prior Level of Function : Needs assist;Patient poor historian/Family not available             Mobility Comments: reported being Ind with no AD ADLs Comments: unsure, but assume some assist at baseline    OT Problem List: Decreased strength;Impaired balance (sitting and/or standing);Decreased cognition;Decreased knowledge of use of DME or AE   OT Treatment/Interventions: Self-care/ADL training;Therapeutic exercise;DME and/or AE instruction;Therapeutic activities;Patient/family education;Balance training      OT Goals(Current goals can be found in the care plan section)   Acute Rehab OT Goals Patient Stated Goal:  unable OT Goal Formulation: Patient unable to participate in goal setting Time For Goal Achievement: 11/03/23 Potential to Achieve Goals: Good   OT Frequency:  Min 2X/week    Co-evaluation              AM-PAC OT 6 Clicks Daily Activity     Outcome Measure Help from another person eating meals?: None Help from another person taking care of personal grooming?: A Little Help from another person toileting, which includes using toliet, bedpan, or urinal?: A Little Help from another person bathing (including washing, rinsing, drying)?: A Little Help from another person to put on and taking off regular upper body clothing?: A Little Help from another person to put on and taking off regular lower body clothing?: None 6 Click Score: 20   End of Session Equipment Utilized During Treatment: Gait belt Nurse Communication: Mobility status  Activity Tolerance: Patient tolerated treatment well Patient left: in chair;with call bell/phone within reach;with chair alarm set;Other (comment) (MD in room)  OT Visit Diagnosis: Unsteadiness on feet (R26.81);Muscle weakness (generalized) (M62.81);Other symptoms and signs involving cognitive function                Time: 9050-8984 OT Time Calculation (min): 26 min Charges:  OT General Charges $OT Visit: 1 Visit OT Evaluation $OT Eval Low Complexity: 1 Low  Elma JONETTA Lebron FREDERICK, OTR/L [facility] Office: [phone]   Elma JONETTA Lebron 10/20/2023, 11:23 AM

## 2023-10-20 NOTE — Care Management Obs Status (Signed)
 MEDICARE OBSERVATION STATUS NOTIFICATION   Patient Details  Name: Lucas White MRN: [id number] Date of Birth: 08-Sep-1960   Medicare Observation Status Notification Given:  Yes    Jermond Burkemper G., RN 10/20/2023, 8:59 AM

## 2023-10-20 NOTE — Evaluation (Addendum)
 Physical Therapy Evaluation Patient Details Name: Lucas White MRN: [id number] DOB: 1960/02/23 Today's Date: 10/20/2023  History of Present Illness  Pt is a 63 y.o. male presenting 10/17 with generalized weakness, difficulty ambulating, incontinence, poor oral intake x 1 week. Found to have elevated CK, CXR with mild diffuse interstitial opacities, possibly related to sepsis. MRI brain negative. EEG showed encephalopathy. PMH: DMII, bipolar disorder, dementia with behavioral disturbance/psychosis, , HLD, neuropathy, PTSD.   Clinical Impression  Pt received in supine and agreeable to PT eval. Unclear if pt is a reliable historian as pt was A&Ox1 with difficulty answering home set-up and PLOF questions (hx of dementia). Pt reported being independent for mobility with no AD. Pt is likely close to baseline with CGA/supervision to ambulate 453ft with no AD and negotiate two steps with one handrail. Intermittent following of commands during session with increased time needed. At this time, recommending return home for a familiar environment with spouse assisting as needed. Recommending HHPT for home safety evaluation and to prevent future falls. Acute PT to follow.         If plan is discharge home, recommend the following: Assist for transportation;Help with stairs or ramp for entrance;Direct supervision/assist for medications management;Direct supervision/assist for financial management;A little help with bathing/dressing/bathroom;Assistance with cooking/housework   Can travel by private vehicle    Yes    Equipment Recommendations None recommended by PT     Functional Status Assessment Patient has had a recent decline in their functional status and demonstrates the ability to make significant improvements in function in a reasonable and predictable amount of time.     Precautions / Restrictions Precautions Precautions: Fall Recall of Precautions/Restrictions: Impaired Restrictions Weight  Bearing Restrictions Per Provider Order: No      Mobility  Bed Mobility Overal bed mobility: Needs Assistance Bed Mobility: Supine to Sit     Supine to sit: Supervision     General bed mobility comments: for safety    Transfers Overall transfer level: Needs assistance Equipment used: None Transfers: Sit to/from Stand Sit to Stand: Contact guard assist      General transfer comment: CGA for safety, no physical assist    Ambulation/Gait Ambulation/Gait assistance: Supervision, Contact guard assist Gait Distance (Feet): 400 Feet Assistive device: None Gait Pattern/deviations: Step-through pattern, Decreased stride length, Shuffle, Trunk flexed Gait velocity: decr     General Gait Details: Steady gait with shuffling steps. Tendency to look at the ground with cues for upright posture and to look forwards. Able to slightly change gait speed with inability to follow commands for dynamic balance assessment  Stairs Stairs: Yes Stairs assistance: Contact guard assist Stair Management: One rail Left, One rail Right, Forwards Number of Stairs: 2 General stair comments: cueing for technique and to hold onto handrail    Balance Overall balance assessment: Needs assistance Sitting-balance support: No upper extremity supported, Feet supported Sitting balance-Leahy Scale: Good    Standing balance support: No upper extremity supported Standing balance-Leahy Scale: Good       Pertinent Vitals/Pain Pain Assessment Pain Assessment: Faces Faces Pain Scale: Hurts a little bit Pain Location: L lateral thigh Pain Descriptors / Indicators: Discomfort Pain Intervention(s): Limited activity within patient's tolerance, Monitored during session, Repositioned    Home Living Family/patient expects to be discharged to:: Private residence Living Arrangements: Spouse/significant other Available Help at Discharge: Family;Available PRN/intermittently Type of Home: House Home Access:  Stairs to enter Entrance Stairs-Rails:  (at least one rail, unsure what side) Entrance Stairs-Number of Steps:  4   Home Layout: Two level Home Equipment: None Additional Comments: questionable historian, unable to answer all questions    Prior Function Prior Level of Function : Needs assist;Patient poor historian/Family not available      Mobility Comments: reported being Ind with no AD ADLs Comments: unsure, but assume some assist at baseline     Extremity/Trunk Assessment   Upper Extremity Assessment Upper Extremity Assessment: Defer to OT evaluation    Lower Extremity Assessment Lower Extremity Assessment: Overall WFL for tasks assessed    Cervical / Trunk Assessment Cervical / Trunk Assessment: Normal  Communication   Communication Communication: Impaired Factors Affecting Communication: Difficulty expressing self    Cognition Arousal: Alert Behavior During Therapy: Flat affect   PT - Cognitive impairments: History of cognitive impairments, No family/caregiver present to determine baseline, Orientation, Awareness, Memory, Sequencing, Problem solving, Safety/Judgement   Orientation impairments: Place, Time, Situation    PT - Cognition Comments: Hx of dementia, would fluctuate between answering questions and tangential speech. Inconsistent command following Following commands: Impaired Following commands impaired: Follows one step commands inconsistently, Follows one step commands with increased time     Cueing Cueing Techniques: Verbal cues, Tactile cues, Visual cues      PT Assessment Patient needs continued PT services  PT Problem List Decreased activity tolerance;Decreased balance;Decreased mobility;Decreased safety awareness;Decreased knowledge of use of DME       PT Treatment Interventions DME instruction;Gait training;Therapeutic activities;Functional mobility training;Stair training;Therapeutic exercise;Balance training;Neuromuscular  re-education;Patient/family education    PT Goals (Current goals can be found in the Care Plan section)  Acute Rehab PT Goals Patient Stated Goal: to go home PT Goal Formulation: With patient Time For Goal Achievement: 11/03/23 Potential to Achieve Goals: Good    Frequency Min 1X/week        AM-PAC PT 6 Clicks Mobility  Outcome Measure Help needed turning from your back to your side while in a flat bed without using bedrails?: None Help needed moving from lying on your back to sitting on the side of a flat bed without using bedrails?: A Little Help needed moving to and from a bed to a chair (including a wheelchair)?: A Little Help needed standing up from a chair using your arms (e.g., wheelchair or bedside chair)?: A Little Help needed to walk in hospital room?: A Little Help needed climbing 3-5 steps with a railing? : A Little 6 Click Score: 19    End of Session Equipment Utilized During Treatment: Gait belt Activity Tolerance: Patient tolerated treatment well Patient left: in chair;with call bell/phone within reach;with chair alarm set;Other (comment) (MD) Nurse Communication: Mobility status PT Visit Diagnosis: Other abnormalities of gait and mobility (R26.89)    Time: 9050-8997 PT Time Calculation (min) (ACUTE ONLY): 13 min   Charges:   PT Evaluation $PT Eval Low Complexity: 1 Low   PT General Charges $$ ACUTE PT VISIT: 1 Visit       Kate ORN, PT, DPT Secure [facility]  Re[facility] Office [phone]  Kate BRAVO Wendolyn 10/20/2023, 11:09 AM

## 2023-10-20 NOTE — Progress Notes (Addendum)
 Progress Note   Patient: Lucas White FMW:[id number] DOB: 1960/11/23 DOA: 10/19/2023     0 DOS: the patient was seen and examined on 10/20/2023    Brief hospital course: The patient with PMH of type II DM, bipolar disorder, dementia with behavioral issues with psychosis, HLD, neuropathy, BPH, PTSD presented to [facility] 10/19/2023 with complaints of confusion, progressive generalized weakness, difficulty ambulating, poor p.o. intake, and fall. History was taken from wife and ED provider as the patient was unable to provide any history.  Transferred to [facility] evening of 10/17 for weekend EEG.  Assessment and Plan: Acute metabolic encephalopathy Continuing, appears to be stable from admission.  Oriented only to self.  EEG shows encephalopathy, no sign of seizures or epileptiform discharges.  CT head and MRI brain unremarkable.  Metabolic workup thus far negative aside from elevated procalcitonin, but known continue current CAP. Pending workup we may need to consult neurology.  Currently holding Zyprexa  and Seroquel  given concern for possible neuroleptic malignant syndrome. - Continue as needed Ativan  - Psych consulted, have not yet seen, appreciate their thoughts - Continue home Lexapro , methadone  - Continue gabapentin  at reduced dose 300 mg 3 times daily  CAP CXR on admission showing mild diffuse interstitial opacities.  Started on azithromycin  and ceftriaxone  at admission given CXR and procalcitonin.  Today patient doing well on room air with no respiratory distress.  Fall at home, initial encounter Ambulate with assistance.  PT and OT consulted.   Elevated CK Rigidity Provide the patient walks without any assistance at his baseline. Does not have any significant rigidity. CK is minimally elevated. Currently does not meet criteria for NMS. Will hold antipsychotic medication and monitor. Follow-up in psychiatry consultation. Gentle IV hydration. Robaxin  as needed.    Pedal edema Acute in nature per wife.  Bilateral lower extremity ultrasound ordered.   Psychosis PTSD Aggressive behavior, adult Dementia with behavioral disturbance Not on any dementia medication. Continue antidepressant with Lexapro . Holding antipsychotic medication as above. Psychiatry consult.   Chronic pain syndrome  On methadone  20 mg twice daily. For now we will continue. Gabapentin  dose reduced as above from 600 mg 4 times daily to 300 mg 3 times daily based on confusion.   Type 2 diabetes mellitus with uncontrolled with hyperglycemia, with long-term current use of insulin  Hemoglobin A1c was 7.6  2 years ago. Will recheck hemoglobin A1c. Home regimen includes metformin  1000, empagliflozin , sitagliptin, Lantus  20 units. For now we will only continue sliding scale due to presentation with hypoglycemia.   Obesity (BMI 30.0-34.9) Placing the patient at high risk of poor outcome. ABG does not show any significant hypercarbia.   GERD. Continue PPI.   BPH. Continue Flomax .  Subjective:  Mr. Khachatryan was found lying in bed awake watching television.  Currently alert but oriented only to himself.  Unable to clearly answer questions.  Does say that he is tired of this and that he would like his wife at his side.  No acute distress.  Is pleasant and will smile and nod during conversation.   Physical Exam: BP (!) 119/101 (BP Location: Left Arm)   Pulse 73   Temp 98.3 F (36.8 C)   Resp 16   SpO2 93%    General appearance: Alert, oriented only to himself. In no distress.  HEENT:  Anicteric, conjunctivae and sclerae normal without injection or icterus, lids and lashes normal.   Cor: RRR, without murmurs, rubs. No LE edema Resp: Normal respiratory rate and rhythm.  CTAB  without rales or wheezes. Abd: No TTP or rebound all quadrants.  .  MSK: Symmetrical without gross deformities of the hands, large joints, or legs. Skin: Cap refill normal, Skin intact without significant  rashes or lesions. Neuro:  Speech is fluent.  Unable to name or describe objects in room.  Oriented only to himself.  When asked about year, location, president he reports he knows these things but cannot say. Psych: Attention span limited, attention seems to drift.  Data Reviewed: CXR 10/17 with mild diffuse interstitial opacities CT head without contrast 10/17 unremarkable MRI brain without contrast 1017 unremarkable  Admission labs: Ammonia 62 Procalcitonin >150 A1c 7.6 (improved from 12.1 in 2022) Normal/negative valproic acid, ethanol, CRP, TSH, T4, folate, B12, lactic acid  Labs since admission: BNP 17.9 Magnesium  1.4 CMP clinically insignificant CBC with mild anemia to 11.1, stable from previous results in 2023 CK elevated but stable 798 > 803 > 786 Respiratory panel negative   Recent Labs  Lab 10/19/23 0823 10/20/23 0519  NA 143 141  K 4.1 3.6  CO2 30 27  BUN 9 5*  CREATININE 1.38* 1.06  MG  --  1.4*  WBC 7.3 5.3  HGB 11.6* 11.1*  PLT 400 349   Family Communication: None thus far  Disposition: Status is: Observation The patient remains OBS appropriate and will d/c before 2 midnights.  Author: Betsey CHRISTELLA Helling, MD 10/20/2023 10:42 AM  For on call review [email or web address].

## 2023-10-21 DIAGNOSIS — G894 Chronic pain syndrome: Secondary | ICD-10-CM

## 2023-10-21 DIAGNOSIS — F03918 Unspecified dementia, unspecified severity, with other behavioral disturbance: Secondary | ICD-10-CM | POA: Diagnosis not present

## 2023-10-21 DIAGNOSIS — G9341 Metabolic encephalopathy: Secondary | ICD-10-CM | POA: Diagnosis not present

## 2023-10-21 DIAGNOSIS — R748 Abnormal levels of other serum enzymes: Secondary | ICD-10-CM | POA: Diagnosis not present

## 2023-10-21 LAB — GLUCOSE, CAPILLARY
Glucose-Capillary: 100 mg/dL — ABNORMAL HIGH (ref 70–99)
Glucose-Capillary: 105 mg/dL — ABNORMAL HIGH (ref 70–99)
Glucose-Capillary: 135 mg/dL — ABNORMAL HIGH (ref 70–99)
Glucose-Capillary: 152 mg/dL — ABNORMAL HIGH (ref 70–99)
Glucose-Capillary: 93 mg/dL (ref 70–99)
Glucose-Capillary: 98 mg/dL (ref 70–99)
Glucose-Capillary: 98 mg/dL (ref 70–99)

## 2023-10-21 LAB — CBC
HCT: 34.6 % — ABNORMAL LOW (ref 39.0–52.0)
Hemoglobin: 11.3 g/dL — ABNORMAL LOW (ref 13.0–17.0)
MCH: 28.9 pg (ref 26.0–34.0)
MCHC: 32.7 g/dL (ref 30.0–36.0)
MCV: 88.5 fL (ref 80.0–100.0)
Platelets: 371 K/uL (ref 150–400)
RBC: 3.91 MIL/uL — ABNORMAL LOW (ref 4.22–5.81)
RDW: 12.5 % (ref 11.5–15.5)
WBC: 6 K/uL (ref 4.0–10.5)
nRBC: 0 % (ref 0.0–0.2)

## 2023-10-21 LAB — COMPREHENSIVE METABOLIC PANEL WITH GFR
ALT: 13 U/L (ref 0–44)
AST: 21 U/L (ref 15–41)
Albumin: 3.6 g/dL (ref 3.5–5.0)
Alkaline Phosphatase: 78 U/L (ref 38–126)
Anion gap: 12 (ref 5–15)
BUN: <5 mg/dL — ABNORMAL LOW (ref 8–23)
CO2: 25 mmol/L (ref 22–32)
Calcium: 9.2 mg/dL (ref 8.9–10.3)
Chloride: 102 mmol/L (ref 98–111)
Creatinine, Ser: 1.16 mg/dL (ref 0.61–1.24)
GFR, Estimated: >60 mL/min (ref >60)
Glucose, Bld: 98 mg/dL (ref 70–99)
Potassium: 4 mmol/L (ref 3.5–5.1)
Sodium: 139 mmol/L (ref 135–145)
Total Bilirubin: 0.6 mg/dL (ref 0.0–1.2)
Total Protein: 7.1 g/dL (ref 6.5–8.1)

## 2023-10-21 LAB — MAGNESIUM: Magnesium: 1.7 mg/dL (ref 1.7–2.4)

## 2023-10-21 LAB — CK: Total CK: 556 U/L — ABNORMAL HIGH (ref 49–397)

## 2023-10-21 MED ORDER — LORAZEPAM 2 MG/ML IJ SOLN
INTRAMUSCULAR | Status: AC
  Administered 2023-10-21: 1 mg via INTRAVENOUS
  Filled 2023-10-21: qty 1

## 2023-10-21 MED ORDER — LORAZEPAM 2 MG/ML IJ SOLN: 1.0000 mg | Freq: Once | INTRAMUSCULAR | Status: AC

## 2023-10-21 NOTE — Plan of Care (Signed)
  Problem: Skin Integrity: Goal: Risk for impaired skin integrity will decrease Outcome: Progressing   Problem: Nutritional: Goal: Maintenance of adequate nutrition will improve Outcome: Progressing Goal: Progress toward achieving an optimal weight will improve Outcome: Progressing   Problem: Clinical Measurements: Goal: Ability to maintain clinical measurements within normal limits will improve Outcome: Progressing Goal: Will remain free from infection Outcome: Progressing Goal: Diagnostic test results will improve Outcome: Progressing Goal: Respiratory complications will improve Outcome: Progressing Goal: Cardiovascular complication will be avoided Outcome: Progressing   Problem: Elimination: Goal: Will not experience complications related to bowel motility Outcome: Progressing Goal: Will not experience complications related to urinary retention Outcome: Progressing   Problem: Safety: Goal: Ability to remain free from injury will improve Outcome: Progressing   Problem: Pain Managment: Goal: General experience of comfort will improve and/or be controlled Outcome: Progressing

## 2023-10-21 NOTE — Progress Notes (Addendum)
 Progress Note   Patient: Lucas White FMW:[id number] DOB: 1960-11-23 DOA: 10/19/2023     1 DOS: the patient was seen and examined on 10/21/2023    Brief hospital course: The patient with PMH of type II DM, bipolar disorder, dementia with behavioral issues with psychosis, HLD, neuropathy, BPH, PTSD presented to [facility] 10/19/2023 with complaints of confusion, progressive generalized weakness, difficulty ambulating, poor p.o. intake, and fall. History was taken from wife and ED provider as the patient was unable to provide any history.  Transferred to [facility] evening of 10/17 for weekend EEG.  Assessment and Plan: Acute metabolic encephalopathy Continuing, appears to be stable from admission.  Oriented only to self.  EEG shows encephalopathy, no sign of seizures or epileptiform discharges.  CT head and MRI brain unremarkable.  Metabolic workup thus far negative aside from elevated procalcitonin, but known continue current CAP. Pending workup we may need to consult neurology.  Currently holding Zyprexa  and Seroquel  given concern for possible neuroleptic malignant syndrome. - Continue as needed Ativan  - Psych consulted, have not yet seen, appreciate their thoughts - Continue home Lexapro , methadone  - Continue gabapentin  at reduced dose 300 mg 3 times daily  CAP CXR on admission showing mild diffuse interstitial opacities.  Started on azithromycin  and ceftriaxone  at admission given CXR and procalcitonin.  Today patient doing well on room air with no respiratory distress.  Fall at home, initial encounter Ambulate with assistance.  PT and OT consulted.   Elevated CK Rigidity Provide the patient walks without any assistance at his baseline. Does not have any significant rigidity. CK is minimally elevated. Currently does not meet criteria for NMS. Per Psych, Olanzapine  2.5 mg IM Q 12 HR as needed for psychotic agitation not amenable to verbal redirection  Gentle IV  hydration. Robaxin  as needed.   Pedal edema Acute in nature per wife.  Bilateral lower extremity ultrasound ordered.   Psychosis PTSD Aggressive behavior, adult Dementia with behavioral disturbance Not on any dementia medication. Continue antidepressant with Lexapro . Holding antipsychotic medication as above. Psychiatry consult reviewed   Chronic pain syndrome  On methadone  20 mg twice daily. For now we will continue. Gabapentin  dose reduced as above from 600 mg 4 times daily to 300 mg 3 times daily based on confusion.   Type 2 diabetes mellitus with uncontrolled with hyperglycemia, with long-term current use of insulin  Hemoglobin A1c was 7.6  2 years ago. Will recheck hemoglobin A1c. Home regimen includes metformin  1000, empagliflozin , sitagliptin, Lantus  20 units. For now we will only continue sliding scale due to presentation with hypoglycemia.   Obesity (BMI 30.0-34.9) Placing the patient at high risk of poor outcome. ABG does not show any significant hypercarbia.   GERD. Continue PPI.   BPH. Continue Flomax .  Subjective:  Lucas White was found lying in bed awake watching television.  Currently alert but oriented only to himself.   He repeatedly asked what the time was. Otherwise not in any acute distress.  Later in the day I was informed that patient became more agitated and restless, he was tachycardic, tachyepnic and sweaty. I ordered  soft restraints and  one time dose of ativan . He was evaluated within an hour of restraints orders, patient was sleeping comfortably his hemodynamics stabilized.     Physical Exam: BP 121/73 (BP Location: Right Arm)   Pulse 79   Temp 98.8 F (37.1 C) (Oral)   Resp 19   SpO2 96%    General appearance: Alert, oriented only to  himself. In no distress.  HEENT:  Anicteric, conjunctivae and sclerae normal without injection or icterus, lids and lashes normal.   Cor: RRR, without murmurs, rubs. No LE edema Resp: Normal respiratory  rate and rhythm.  CTAB without rales or wheezes. Abd: No TTP or rebound all quadrants.  .  MSK: Symmetrical without gross deformities of the hands, large joints, or legs. Skin: Cap refill normal, Skin intact without significant rashes or lesions. Neuro:  Speech is fluent.  Unable to name or describe objects in room.  Oriented only to himself.  When asked about year, location, president he reports he knows these things but cannot say. Psych: Attention span limited, attention seems to drift.  Data Reviewed: CXR 10/17 with mild diffuse interstitial opacities CT head without contrast 10/17 unremarkable MRI brain without contrast 1017 unremarkable  Admission labs: Ammonia 62 Procalcitonin >150 A1c 7.6 (improved from 12.1 in 2022) Normal/negative valproic acid, ethanol, CRP, TSH, T4, folate, B12, lactic acid  Labs since admission: BNP 17.9 Magnesium  1.4 CMP clinically insignificant CBC with mild anemia to 11.1, stable from previous results in 2023 CK elevated but stable 798 > 803 > 786 Respiratory panel negative   Recent Labs  Lab 10/19/23 0823 10/20/23 0519 10/21/23 0642  NA 143 141 139  K 4.1 3.6 4.0  CO2 30 27 25   BUN 9 5* <5*  CREATININE 1.38* 1.06 1.16  MG  --  1.4* 1.7  WBC 7.3 5.3 6.0  HGB 11.6* 11.1* 11.3*  PLT 400 349 371   Family Communication: None thus far  Disposition: Status is: Observation   Author: Landon FORBES Baller, MD 10/21/2023 12:01 PM  For on call review [email or web address].

## 2023-10-21 NOTE — Plan of Care (Signed)
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
## 2023-10-22 ENCOUNTER — Inpatient Hospital Stay (HOSPITAL_COMMUNITY)

## 2023-10-22 DIAGNOSIS — M7989 Other specified soft tissue disorders: Secondary | ICD-10-CM

## 2023-10-22 DIAGNOSIS — G9341 Metabolic encephalopathy: Secondary | ICD-10-CM | POA: Diagnosis not present

## 2023-10-22 LAB — GLUCOSE, CAPILLARY
Glucose-Capillary: 101 mg/dL — ABNORMAL HIGH (ref 70–99)
Glucose-Capillary: 67 mg/dL — ABNORMAL LOW (ref 70–99)
Glucose-Capillary: 91 mg/dL (ref 70–99)
Glucose-Capillary: 176 mg/dL — ABNORMAL HIGH (ref 70–99)
Glucose-Capillary: 115 mg/dL — ABNORMAL HIGH (ref 70–99)
Glucose-Capillary: 136 mg/dL — ABNORMAL HIGH (ref 70–99)
Glucose-Capillary: 123 mg/dL — ABNORMAL HIGH (ref 70–99)

## 2023-10-22 LAB — ALDOLASE: Aldolase: 10.6 U/L — ABNORMAL HIGH (ref 3.3–10.3)

## 2023-10-22 LAB — CK: Total CK: 589 U/L — ABNORMAL HIGH (ref 49–397)

## 2023-10-22 MED ORDER — STERILE WATER FOR INJECTION IJ SOLN
INTRAMUSCULAR | Status: AC
  Filled 2023-10-22: qty 10

## 2023-10-22 MED ORDER — INSULIN ASPART 100 UNIT/ML IJ SOLN
0.0000 [IU] | Freq: Three times a day (TID) | INTRAMUSCULAR | Status: DC
  Administered 2023-10-22: 1 [IU] via SUBCUTANEOUS
  Administered 2023-10-23: 3 [IU] via SUBCUTANEOUS
  Administered 2023-10-24 (×2): 1 [IU] via SUBCUTANEOUS
  Administered 2023-10-25: 2 [IU] via SUBCUTANEOUS
  Administered 2023-10-25 – 2023-10-26 (×3): 1 [IU] via SUBCUTANEOUS
  Administered 2023-10-27: 2 [IU] via SUBCUTANEOUS
  Administered 2023-10-27 – 2023-10-28 (×2): 1 [IU] via SUBCUTANEOUS
  Administered 2023-10-28: 3 [IU] via SUBCUTANEOUS
  Administered 2023-10-29 (×2): 2 [IU] via SUBCUTANEOUS
  Administered 2023-10-29: 1 [IU] via SUBCUTANEOUS

## 2023-10-22 MED ORDER — AZITHROMYCIN 500 MG PO TABS
500.0000 mg | ORAL_TABLET | Freq: Once | ORAL | Status: AC
  Administered 2023-10-23: 500 mg via ORAL
  Filled 2023-10-22: qty 1

## 2023-10-22 MED ORDER — INSULIN ASPART 100 UNIT/ML IJ SOLN: 0.0000 [IU] | Freq: Every day | INTRAMUSCULAR | Status: DC

## 2023-10-22 MED ORDER — OLANZAPINE 2.5 MG PO TABS
2.5000 mg | ORAL_TABLET | Freq: Two times a day (BID) | ORAL | Status: DC
  Administered 2023-10-22 (×2): 2.5 mg via ORAL
  Filled 2023-10-22 (×2): qty 1

## 2023-10-22 NOTE — Plan of Care (Signed)
  Problem: Education: Goal: Ability to describe self-care measures that may prevent or decrease complications (Diabetes Survival Skills Education) will improve Outcome: Progressing   Problem: Coping: Goal: Ability to adjust to condition or change in health will improve Outcome: Progressing   Problem: Health Behavior/Discharge Planning: Goal: Ability to identify and utilize available resources and services will improve Outcome: Progressing

## 2023-10-22 NOTE — Progress Notes (Signed)
 PROGRESS NOTE    Lucas White  FMW:[id number] DOB: [birth year] DOA: 10/19/2023 PCP: [facility]   Brief Narrative:  The patient with PMH of type II DM, bipolar disorder, dementia with behavioral issues with psychosis, HLD, neuropathy, BPH, PTSD presented to [facility] 10/19/2023 with complaints of confusion, progressive generalized weakness, difficulty ambulating, poor p.o. intake, and fall. History was taken from wife and ED provider as the patient was unable to provide any history. Transferred to [facility] evening of 10/17 for weekend EEG.   Assessment & Plan:   Principal Problem:   Acute metabolic encephalopathy Active Problems:   Chronic pain syndrome   Type 2 diabetes mellitus with hyperglycemia, with long-term current use of insulin  (HCC)   Obesity (BMI 30.0-34.9)   Psychosis (HCC)   PTSD (post-traumatic stress disorder)   Aggressive behavior, adult   Dementia with behavioral disturbance (HCC)   Elevated CK   Pedal edema   Rigidity   Fall at home, initial encounter  Acute metabolic encephalopathy Still ongoing, oriented only to self.  EEG shows encephalopathy, no sign of seizures or epileptiform discharges.  CT head and MRI brain unremarkable.  Metabolic workup thus far negative aside from elevated procalcitonin, MRI brain unremarkable as well. Psych on board, continue home Lexapro , methadone  and continue gabapentin  at reduced dose 300 mg 3 times daily.  Appreciate psychiatry help.   CAP CXR on admission showing mild diffuse interstitial opacities.  Continue Rocephin  and Zithromax  for total of 5 days.  Patient asymptomatic.   Fall at home, initial encounter Ambulate with assistance.  Seen by PT OT, home health recommended.   Elevated CK / Rigidity: Does not have any significant rigidity on my exam today and CK only minimally elevated, does not meet criteria for NMS.  Awaiting CK level today.  Encourage oral hydration.   Pedal edema Acute in  nature per wife.  Bilateral lower extremity ultrasound ordered.   Psychosis / PTSD / Aggressive behavior, adult / Dementia with behavioral disturbance Not on any dementia medication. Continue antidepressant with Lexapro .  Psychiatry managing.   Chronic pain syndrome  On methadone  20 mg twice daily.  On reduced dose of gabapentin .   Type 2 diabetes mellitus with uncontrolled with hyperglycemia, with long-term current use of insulin  Hemoglobin A1c 7.6. Home regimen includes metformin  1000, empagliflozin , sitagliptin, Lantus  20 units.  Currently intermittent hypoglycemia.  Not on any SSI either.  Will order SSI and hypoglycemic protocol.   Obesity (BMI 30.0-34.9) Placing the patient at high risk of poor outcome. ABG does not show any significant hypercarbia.  Will need counseling for weight loss and diet modification once he is able to comprehend.   GERD. Continue PPI.   BPH. Continue Flomax .  DVT prophylaxis: heparin injection 5,000 Units Start: 10/19/23 1515 TED hose Start: 10/19/23 1504   Code Status: Full Code  Family Communication:  None present at bedside.    Status is: Inpatient Remains inpatient appropriate because: Still encephalopathic.   Estimated body mass index is 27.67 kg/m as calculated from the following:   Height as of 09/28/21: 5' 11 (1.803 m).   Weight as of 09/28/21: 90 kg.    Nutritional Assessment: There is no height or weight on file to calculate BMI.. Seen by dietician.  I agree with the assessment and plan as outlined below: Nutrition Status:        . Skin Assessment: I have examined the patient's skin and I agree with the wound assessment as performed by the wound  care RN as outlined below:    Consultants:  Psych   Procedures:  None none  Antimicrobials:  Anti-infectives (From admission, onward)    Start     Dose/Rate Route Frequency Ordered Stop   10/20/23 1000  cefTRIAXone  (ROCEPHIN ) 2 g in sodium chloride  0.9 % 100 mL IVPB        2  g 200 mL/hr over 30 Minutes Intravenous Every 24 hours 10/19/23 1529     10/20/23 1000  azithromycin  (ZITHROMAX ) 500 mg in sodium chloride  0.9 % 250 mL IVPB        500 mg 250 mL/hr over 60 Minutes Intravenous Every 24 hours 10/19/23 1529 10/25/23 0959   10/19/23 1400  cefTRIAXone  (ROCEPHIN ) 2 g in sodium chloride  0.9 % 100 mL IVPB        2 g 200 mL/hr over 30 Minutes Intravenous  Once 10/19/23 1356 10/19/23 1647   10/19/23 1400  azithromycin  (ZITHROMAX ) 500 mg in sodium chloride  0.9 % 250 mL IVPB        500 mg 250 mL/hr over 60 Minutes Intravenous  Once 10/19/23 1358 10/19/23 1647         Subjective: Seen and examined, patient was sleepy but easily arousable.  When he was awake, he was oriented to his name and the city [place] but was unable to answer any other questions regarding orientation.  Appeared comfortable with no complaints.  Had mittens in the left hand.  Objective: Vitals:   10/21/23 2352 10/21/23 2352 10/22/23 0342 10/22/23 0755  BP: 119/77 119/77 (!) 134/104 129/76  Pulse: 84 80 (!) 108 79  Resp: 17 17 20    Temp: 98.7 F (37.1 C) 98.7 F (37.1 C) 98.4 F (36.9 C) 98.5 F (36.9 C)  TempSrc:  Oral Oral Oral  SpO2: 94% (!) 86% 95% 97%    Intake/Output Summary (Last 24 hours) at 10/22/2023 1026 Last data filed at 10/22/2023 0345 Gross per 24 hour  Intake 632.1 ml  Output 750 ml  Net -117.9 ml   There were no vitals filed for this visit.  Examination:  General exam: Appears calm and comfortable  Respiratory system: Clear to auscultation. Respiratory effort normal. Cardiovascular system: S1 & S2 heard, RRR. No JVD, murmurs, rubs, gallops or clicks. No pedal edema. Gastrointestinal system: Abdomen is nondistended, soft and nontender. No organomegaly or masses felt. Normal bowel sounds heard. Central nervous system: Alert and oriented x 1-2. No focal neurological deficits. Extremities: Symmetric 5 x 5 power. Skin: No rashes, lesions or ulcers  Data  Reviewed: I have personally reviewed following labs and imaging studies  CBC: Recent Labs  Lab 10/19/23 0823 10/20/23 0519 10/21/23 0642  WBC 7.3 5.3 6.0  NEUTROABS 4.9 1.5*  --   HGB 11.6* 11.1* 11.3*  HCT 36.5* 33.6* 34.6*  MCV 89.7 87.5 88.5  PLT 400 349 371   Basic Metabolic Panel: Recent Labs  Lab 10/19/23 0823 10/20/23 0519 10/21/23 0642  NA 143 141 139  K 4.1 3.6 4.0  CL 103 103 102  CO2 30 27 25   GLUCOSE 103* 94 98  BUN 9 5* <5*  CREATININE 1.38* 1.06 1.16  CALCIUM 9.9 8.9 9.2  MG  --  1.4* 1.7   GFR: CrCl cannot be calculated (Unknown ideal weight.). Liver Function Tests: Recent Labs  Lab 10/19/23 0823 10/20/23 0519 10/21/23 0642  AST 28 23 21   ALT 8 11 13   ALKPHOS 90 75 78  BILITOT 0.3 0.8 0.6  PROT 7.7 6.5 7.1  ALBUMIN  4.3 3.3* 3.6   No results for input(s): LIPASE, AMYLASE in the last 168 hours. Recent Labs  Lab 10/19/23 1900  AMMONIA 62*   Coagulation Profile: Recent Labs  Lab 10/19/23 0823  INR 1.0   Cardiac Enzymes: Recent Labs  Lab 10/19/23 0823 10/19/23 1317 10/20/23 0519 10/21/23 0642  CKTOTAL 798* 803* 786* 556*   BNP (last 3 results) No results for input(s): PROBNP in the last 8760 hours. HbA1C: Recent Labs    10/19/23 1900  HGBA1C 7.6*   CBG: Recent Labs  Lab 10/21/23 2021 10/21/23 2354 10/22/23 0345 10/22/23 0756 10/22/23 0935  GLUCAP 135* 105* 101* 67* 91   Lipid Profile: No results for input(s): CHOL, HDL, LDLCALC, TRIG, CHOLHDL, LDLDIRECT in the last 72 hours. Thyroid  Function Tests: Recent Labs    10/19/23 1900  TSH 0.491  FREET4 0.93   Anemia Panel: Recent Labs    10/19/23 1900  VITAMINB12 622  FOLATE 13.4   Sepsis Labs: Recent Labs  Lab 10/19/23 0837 10/19/23 1116 10/19/23 1327 10/19/23 1900  PROCALCITON  --   --   --  >150.00  LATICACIDVEN 1.8 1.2 1.0  --     Recent Results (from the past 240 hours)  Blood Culture (routine x 2)     Status: None (Preliminary  result)   Collection Time: 10/19/23  8:05 AM   Specimen: BLOOD  Result Value Ref Range Status   Specimen Description   Final    BLOOD LEFT ANTECUBITAL Performed at [facility], [address], [place], [place] [address]    Special Requests   Final    BOTTLES DRAWN AEROBIC AND ANAEROBIC Blood Culture adequate volume Performed at [facility], [address], [place], [place] [address]    Culture   Final    NO GROWTH 3 DAYS Performed at [facility], [address] [place], [place] [address]    Report Status PENDING  Incomplete  Blood Culture (routine x 2)     Status: None (Preliminary result)   Collection Time: 10/19/23  8:23 AM   Specimen: BLOOD  Result Value Ref Range Status   Specimen Description   Final    BLOOD RIGHT ANTECUBITAL Performed at [facility], [address], [place], [place] [address]    Special Requests   Final    BOTTLES DRAWN AEROBIC AND ANAEROBIC Blood Culture adequate volume Performed at [facility], [address], [place], [place] [address]    Culture   Final    NO GROWTH 3 DAYS Performed at [facility], [address], [place], [place] [address]    Report Status PENDING  Incomplete  Resp panel by RT-PCR (RSV, Flu A&B, Covid) Anterior Nasal Swab     Status: None   Collection Time: 10/20/23  1:50 AM   Specimen: Anterior Nasal Swab  Result Value Ref Range Status   SARS Coronavirus 2 by RT PCR NEGATIVE NEGATIVE Final   Influenza A by PCR NEGATIVE NEGATIVE Final   Influenza B by PCR NEGATIVE NEGATIVE Final    Comment: (NOTE) The Xpert Xpress SARS-CoV-2/FLU/RSV plus assay is intended as an aid in the diagnosis of influenza from Nasopharyngeal swab specimens and should not be used as a sole basis for treatment. Nasal washings and aspirates are unacceptable for Xpert Xpress SARS-CoV-2/FLU/RSV testing.  Fact Sheet for  Patients: BloggerCourse.com  Fact Sheet for Healthcare Providers: SeriousBroker.it  This test is not yet approved or cleared by  the [place]  FDA and has been authorized for detection and/or diagnosis of SARS-CoV-2 by FDA under an Emergency Use Authorization (EUA). This EUA will remain in effect (meaning this test can be used) for the duration of the COVID-19 declaration under Section 564(b)(1) of the Act, 21 U.S.C. section 360bbb-3(b)(1), unless the authorization is terminated or revoked.     Resp Syncytial Virus by PCR NEGATIVE NEGATIVE Final    Comment: (NOTE) Fact Sheet for Patients: BloggerCourse.com  Fact Sheet for Healthcare Providers: SeriousBroker.it  This test is not yet approved or cleared by the [place]  FDA and has been authorized for detection and/or diagnosis of SARS-CoV-2 by FDA under an Emergency Use Authorization (EUA). This EUA will remain in effect (meaning this test can be used) for the duration of the COVID-19 declaration under Section 564(b)(1) of the Act, 21 U.S.C. section 360bbb-3(b)(1), unless the authorization is terminated or revoked.  Performed at [facility], [address] [place], [place] [address]   Respiratory (~20 pathogens) panel by PCR     Status: None   Collection Time: 10/20/23  1:50 AM   Specimen: Nasopharyngeal Swab; Respiratory  Result Value Ref Range Status   Adenovirus NOT DETECTED NOT DETECTED Final   Coronavirus 229E NOT DETECTED NOT DETECTED Final    Comment: (NOTE) The Coronavirus on the Respiratory Panel, DOES NOT test for the novel  Coronavirus (2019 nCoV)    Coronavirus HKU1 NOT DETECTED NOT DETECTED Final   Coronavirus NL63 NOT DETECTED NOT DETECTED Final   Coronavirus OC43 NOT DETECTED NOT DETECTED Final   Metapneumovirus NOT DETECTED NOT DETECTED Final   Rhinovirus / Enterovirus NOT DETECTED NOT  DETECTED Final   Influenza A NOT DETECTED NOT DETECTED Final   Influenza B NOT DETECTED NOT DETECTED Final   Parainfluenza Virus 1 NOT DETECTED NOT DETECTED Final   Parainfluenza Virus 2 NOT DETECTED NOT DETECTED Final   Parainfluenza Virus 3 NOT DETECTED NOT DETECTED Final   Parainfluenza Virus 4 NOT DETECTED NOT DETECTED Final   Respiratory Syncytial Virus NOT DETECTED NOT DETECTED Final   Bordetella pertussis NOT DETECTED NOT DETECTED Final   Bordetella Parapertussis NOT DETECTED NOT DETECTED Final   Chlamydophila pneumoniae NOT DETECTED NOT DETECTED Final   Mycoplasma pneumoniae NOT DETECTED NOT DETECTED Final    Comment: Performed at [facility], [address], [place], [place] [address]     Radiology Studies: No results found.  Scheduled Meds:  divalproex   125 mg Oral Q12H   escitalopram   20 mg Oral Daily   gabapentin   300 mg Oral TID   heparin  5,000 Units Subcutaneous Q8H   methadone   20 mg Oral Q12H   pantoprazole   40 mg Oral BID AC   polyethylene glycol  17 g Oral Daily   QUEtiapine   25 mg Oral QHS   tamsulosin   0.4 mg Oral QHS   Continuous Infusions:  azithromycin  500 mg (10/22/23 1020)   cefTRIAXone  (ROCEPHIN )  IV 2 g (10/22/23 0958)     LOS: 2 days   Fredia Skeeter, MD [facility]  10/22/2023, 10:26 AM   *Please note that this is a verbal dictation therefore any spelling or grammatical errors are due to the Dragon Medical One system interpretation.  Please page via Amion and do not message via secure chat for urgent patient care matters. Secure chat can be used for non urgent patient care matters.  How to contact the [facility] Attending or Consulting provider 7A - 7P or covering  provider during after hours 7P -7A, for this patient?  Check the care team in [facility] and look for a) attending/consulting [facility] provider listed and b) the [facility] team listed. Page or secure chat 7A-7P. Log into [email or web address] and use [place]'s universal password to access. If  you do not have the password, please contact the hospital operator. Locate the [facility] provider you are looking for under [facility] and page to a number that you can be directly reached. If you still have difficulty reaching the provider, please page the [facility] (Director on Call) for the Hospitalists listed on amion for assistance.

## 2023-10-22 NOTE — Consult Note (Signed)
 [facility] Consult Follow-up  Patient Name: .Lucas White  MRN: [id number]  DOB: 12-Mar-1960  Consult Order details:  Orders (From admission, onward)     Start     Ordered   10/20/23 0826  IP CONSULT TO PSYCHIATRY       Ordering Provider: Macario Dorothyann HERO, MD  Provider:  (Not yet assigned)  Question Answer Comment  Location [facility]   Reason for Consult? dementia with bipolar, on seroquel  and zyprexa  for agitation. presnted with confusion. consult for medication management. concern for NMS witth incontinence, delirium, mild CK elevation and rigidity.      10/20/23 0825             Mode of Visit: In person    Psychiatry Consult Evaluation  Service Date: October 22, 2023 LOS:  LOS: 2 days  Chief Complaint I have been going in and out of the hospital. I don't know where I am.   Primary Psychiatric Diagnoses  Dementia with behavioral disturbance with psychosis  Assessment  Lucas White is a 63 y.o. male admitted: Medically on  10/19/2023  7:46 AM for confusion and fall. He carries the psychiatric diagnoses of bipolar disorder, PTSD, dementia with behavioral issues with psychosis and has a past medical history of HLD, neuropathy, BP and T2DM. Psychiatry consulted for medication management and concerns for NMS.   His current presentation of chronic confusion and disorientation, intermittent agitation, tangential thought process and paucity of taught content in the context of the underlying fronto-tempora Dementia is most consistent with Dementia with behavioral disturbance and psychosis. He does not meet criteria for inpatient psychiatric admission based on absent severe agitation, disruptive behavior, suicidal or homicidal ideation, intent or plan. Current outpatient psychotropic medications include Olanzapine  7.5 mg twice daily and Depakote  125 mg three times daily.Historically, he has had some response to these medications.  Patient is compliant  with medications prior to admission as evidenced by reports from his wife in previous documentation. On initial examination, patient is alert and awake but is confused and disoriented to time, place, person or situation.He is tangential with paucity of thought. Please see plan below for detailed recommendations.   10/21/2023 Patient seen face to face in his hospital room. He is awake, alert and oriented x 1. Patient understands that he's in the hospital and was able to name some familial objects like pen and wristwatch, which is an improvement from the day before. However, patient remains confused and disoriented to time, person and situation. He could not recognize this Clinical research associate despite talking to him yesterday and could not fully participate in coherent and meaningful conversation. When asked about his wife, patient states he has not heard from her for a while, but could not answer follow up questions as patient remains tangential with paucity of thought. He however, denies suicidal or homicidal ideation. Collateral information from the treating nurse indicates patient gets agitated intermittently but has neither been out of control nor require intramuscular medications for the past few days. The nurse states further that patient has been compliant with his scheduled medications with improved fluid intake with CK level improving from 786 yesterday to 556 today.    10/22/2023 Patient oriented to self and hospital but was unable to discuss much further. Kept falling asleep. I spoke with patient's wife, seems like he's worse than baseline, likely delirious (was unable to conduct a thorough interview). I think this is pretty clearly an acute encephalopathy on top of a pretty significant  dementia picture. Wife notes worsening since they've moved. We're going to go ahead and restart patient's home Olanzapine , which he uses for agitation at home. We'll start at 2.5 tonight with BID dosing going forward (he's been  regularly getting PRN 2.5 mg at night for agitation anyhow). Hopefully that helps with sleep-wake and agitation generally, will avoid further PRNs, and will keep him out of restraints. Very low concern for NMS. We will continue to follow.   Diagnoses:  Active Hospital problems: Principal Problem:   Acute metabolic encephalopathy Active Problems:   Type 2 diabetes mellitus with hyperglycemia, with long-term current use of insulin  (HCC)   Obesity (BMI 30.0-34.9)   Chronic pain syndrome   Psychosis (HCC)   PTSD (post-traumatic stress disorder)   Aggressive behavior, adult   Dementia with behavioral disturbance (HCC)   Elevated CK   Pedal edema   Rigidity   Fall at home, initial encounter    Plan   ## Psychiatric Medication Recommendations:  --Start Olanzapine  2.5 mg BID for behavioral concerns and to help sleep-wake. Very low concern for NMS at this point.  --Discontinue Seroquel  25 mg at bedtime for sleep/agitation given addition of olanzapine  above.  --Continue Depakote  125 mg twice daily for agitation/seizure prophylaxis --Continue Lexapro  20 mg daily for mood/behavioral disturbance --Continue Olanzapine  2.5 mg IM Q 12 HR as needed for psychotic agitation not amenable to verbal redirection --Please discontinue Ativan  or other BZD due to risk of worsening confusion and disinhibition --Please encourage hydration and monitor CK daily.  --Continue other medical treatment as the primary team.   ## Medical Decision Making Capacity: Not specifically addressed in this encounter  ## Further Work-up:  --  TSH, B12, folate -- most recent EKG on 10/19/2023 had QtC of 432 -- Pertinent labwork reviewed earlier this admission includes: Mg-1.4, Albumin-3.3, CK-786    ## Disposition:-- There are no psychiatric contraindications to discharge at this time  ## Behavioral / Environmental: -Delirium Precautions: Delirium Interventions for Nursing and Staff: - RN to open blinds every AM. - To  Bedside: Glasses, hearing aide, and pt's own shoes. Make available to patients. when possible and encourage use. - Encourage po fluids when appropriate, keep fluids within reach. - OOB to chair with meals. - Passive ROM exercises to all extremities with AM & PM care. - RN to assess orientation to person, time and place QAM and PRN. - Recommend extended visitation hours with familiar family/friends as feasible. - Staff to minimize disturbances at night. Turn off television when pt asleep or when not in use.    ## Safety and Observation Level:  - Based on my clinical evaluation, I estimate the patient to be at low risk of self harm in the current setting. - At this time, we recommend  routine. This decision is based on my review of the chart including patient's history and current presentation, interview of the patient, mental status examination, and consideration of suicide risk including evaluating suicidal ideation, plan, intent, suicidal or self-harm behaviors, risk factors, and protective factors. This judgment is based on our ability to directly address suicide risk, implement suicide prevention strategies, and develop a safety plan while the patient is in the clinical setting. Please contact our team if there is a concern that risk level has changed.  CSSR Risk Category:C-SSRS RISK CATEGORY: No Risk  Suicide Risk Assessment: Patient has following modifiable risk factors for suicide: triggering events and pain, medical illness (ie new dx of cancer), which we are addressing by recommending medications  and providing support. Patient has following non-modifiable or demographic risk factors for suicide: male gender Patient has the following protective factors against suicide: Supportive family and Cultural, spiritual, or religious beliefs that discourage suicide  Thank you for this consult request. Recommendations have been communicated to the primary team.  We will follow up at this time.   Janiene Aarons, MD       History of Present Illness  Relevant Aspects of [facility] Course:   Patient Report:   Patient was in two point restraints and minimally responsive to voice. Appeared tired. Was oriented to self and hospital but soon drifted off. Dysarthric, lethargic appearing. No focal deficits noted. Was unable to follow commands and therefore participate in CAM-ICU testing.    Collateral information:   On conversation with spouse [name] Oats [phone]): Visited patient yesterday. Patient still not at baseline. Uses zyprexa  7.5 mg BID for defiance and agitation at home. Very combative at baseline.  No meds to treat his dementia, understands there is no treatment. Most of the time when you talk to him, you won't get a straight answer. Patient can sometimes be irritated at home, but he understands. Recently moved, however. More disoriented, behavioral dysregulation. Plan is to bring husband home.   Review of Systems  Psychiatric/Behavioral:  Negative for suicidal ideas.      Psychiatric and Social History  Psychiatric History:  Information collected from the patient  Prev Dx/Sx: Demential with behavioral disturbances. PTSD,Bipolar disorder.  Current Psych Provider: None Home Meds (current): Depakote  125 mg three times daily and Olanzapine  7.5 mg twice daily Previous Med Trials: as above Therapy: Unsure  Prior Psych Hospitalization: unsure  Prior Self Harm: unable to answer  Prior Violence: unable to answer   Family Psych History: unable to answer  Family Hx suicide: unable to answer  Social History:  Educational Hx:  unable to answer Occupational Hx:  unable to answer Legal Hx:  unable to answer Living Situation:  unable to answer Spiritual Hx:  unable to answer Access to weapons/lethal means:   unable to answer  Substance History Alcohol: previously per record   Type of alcohol beer Last Drink unsure Number of drinks per day unsure History of  alcohol withdrawal seizures unsure History of DT's unsure Tobacco: unsure Illicit drugs: unsure 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 rate 20, SpO2 97%. There is no height or weight on file to calculate BMI.  Physical Exam  Mental Status Exam: General Appearance: Casual, in two point restraints, laying in bed   Orientation:  Negative  Memory:  Immediate;   Poor Recent;   Poor Remote;   Poor  Concentration:  Concentration: Poor and Attention Span: Poor  Recall:  Poor  Attention  Poor  Eye Contact:  Poor  Speech:  Garbled  Language:  Poor  Volume:  Normal  Mood: unable to assess  Affect:  unable to assess  Thought Process:  Descriptions of Associations: Tangential  Thought Content:  paucity of thought  Suicidal Thoughts:  denies  Homicidal Thoughts:  denies  Judgement:  Other:  unable to assess  Insight:  unable to assess  Psychomotor Activity:  Decreased  Akathisia:  No  Fund of Knowledge:  Poor      Assets:  Others:  unable to participate in meaningfully conversation   Cognition:  Impaired,  Severe  ADL's:  Impaired  AIMS (if indicated):        Other History   These have been pulled in through the EMR, reviewed, and updated if appropriate.  Family History:  The patient's family history includes Diabetes in his father.  Medical History: Past Medical History:  Diagnosis Date   Chronic back pain greater than 3 months duration    Obesity (BMI 30.0-34.9)    Pneumonia due to COVID-19 virus 12/19/2018   Type 2 diabetes mellitus (HCC)     Surgical History: Past Surgical  History:  Procedure Laterality Date   BACK SURGERY     COLONOSCOPY       Medications:   Current Facility-Administered Medications:    acetaminophen  (TYLENOL ) tablet 650 mg, 650 mg, Oral, Q6H PRN **OR** acetaminophen  (TYLENOL ) suppository 650 mg, 650 mg, Rectal, Q6H PRN, Patel, Pranav M, MD   azithromycin  (ZITHROMAX ) 500 mg in sodium chloride  0.9 % 250 mL IVPB, 500 mg, Intravenous, Q24H, Patel, Pranav M, MD, Stopped at 10/21/23 1210   bisacodyl (DULCOLAX) suppository 10 mg, 10 mg, Rectal, Daily PRN, Patel, Pranav M, MD   cefTRIAXone  (ROCEPHIN ) 2 g in sodium chloride  0.9 % 100 mL IVPB, 2 g, Intravenous, Q24H, Tobie Yetta HERO, MD, Stopped at 10/21/23 1049   divalproex  (DEPAKOTE  SPRINKLE) capsule 125 mg, 125 mg, Oral, Q12H, Patel, Pranav M, MD, 125 mg at 10/21/23 2201   escitalopram  (LEXAPRO ) tablet 20 mg, 20 mg, Oral, Daily, Patel, Pranav M, MD, 20 mg at 10/21/23 9041   gabapentin  (NEURONTIN ) capsule 300 mg, 300 mg, Oral, TID, Patel, Pranav M, MD, 300 mg at 10/21/23 2201   heparin injection 5,000 Units, 5,000 Units, Subcutaneous, Q8H, Patel, Pranav M, MD, 5,000 Units at 10/21/23 2202   methadone  (DOLOPHINE ) tablet 20 mg, 20 mg, Oral, Q12H, Patel, Pranav M, MD, 20 mg at 10/21/23 2201   OLANZapine  (ZYPREXA ) injection 2.5 mg, 2.5 mg, Intramuscular, BID PRN, Akintayo, Musa A, MD, 2.5 mg at 10/22/23 0154   ondansetron  (ZOFRAN ) tablet 4 mg, 4 mg, Oral, Q6H PRN **OR** ondansetron  (ZOFRAN ) injection 4 mg, 4 mg, Intravenous, Q6H PRN, Tobie Yetta HERO, MD   pantoprazole  (PROTONIX ) EC tablet 40 mg, 40 mg, Oral, BID AC, Patel, Pranav M, MD, 40 mg at 10/21/23 0959   polyethylene glycol (MIRALAX  / GLYCOLAX ) packet 17 g, 17 g, Oral, Daily, Patel, Pranav M, MD, 17 g at 10/21/23 0959   QUEtiapine  (SEROQUEL ) tablet 25 mg, 25 mg, Oral, QHS, Macario Dorothyann HERO, MD, 25 mg at 10/21/23 2201   tamsulosin  (FLOMAX ) capsule 0.4 mg, 0.4 mg, Oral, QHS, Patel, Pranav M, MD, 0.4 mg at 10/21/23 2201  Allergies: No Known  Allergies  Ashlin Hidalgo, MD

## 2023-10-22 NOTE — Plan of Care (Signed)
  Problem: Nutritional: Goal: Maintenance of adequate nutrition will improve Outcome: Progressing   Problem: Clinical Measurements: Goal: Will remain free from infection Outcome: Progressing   Problem: Education: Goal: Ability to describe self-care measures that may prevent or decrease complications (Diabetes Survival Skills Education) will improve Outcome: Not Progressing   Problem: Coping: Goal: Ability to adjust to condition or change in health will improve Outcome: Not Progressing

## 2023-10-22 NOTE — Progress Notes (Signed)
-----------------------------------------------------------  [facility]--------------------------------------------------- D(Data) A(Action) R(response)     Data: Non-Violent Restraint order to expire 10/22/2023 at 1200    Action: Chart reviewed and EPIC Secure Chat with primary RN regarding necessity. AMION text page to attending MD.    Response: Order updated in EPIC by MD.      Raeley Gilmore, RN [facility]

## 2023-10-22 NOTE — Progress Notes (Signed)
PHARMACIST - PHYSICIAN COMMUNICATION DR:   Jacqulyn Bath CONCERNING: Antibiotic IV to Oral Route Change Policy  RECOMMENDATION: This patient is receiving azithromycin by the intravenous route.  Based on criteria approved by the Pharmacy and Therapeutics Committee, the antibiotic(s) is/are being converted to the equivalent oral dose form(s).   DESCRIPTION: These criteria include:  Patient being treated for a respiratory tract infection, urinary tract infection, cellulitis or clostridium difficile associated diarrhea if on metronidazole  The patient is not neutropenic and does not exhibit a GI malabsorption state  The patient is eating (either orally or via tube) and/or has been taking other orally administered medications for a least 24 hours  The patient is improving clinically and has a Tmax < 100.5  If you have questions about this conversion, please contact the Pharmacy Department  []   [phone] )  [facility] []   [phone] )  [facility] [x]   [phone] )  [facility] []   [phone] )  [facility] []   [phone] )  [facility]

## 2023-10-23 DIAGNOSIS — W19XXXA Unspecified fall, initial encounter: Secondary | ICD-10-CM

## 2023-10-23 DIAGNOSIS — R609 Edema, unspecified: Secondary | ICD-10-CM

## 2023-10-23 DIAGNOSIS — E1165 Type 2 diabetes mellitus with hyperglycemia: Secondary | ICD-10-CM

## 2023-10-23 DIAGNOSIS — E669 Obesity, unspecified: Secondary | ICD-10-CM

## 2023-10-23 DIAGNOSIS — G8929 Other chronic pain: Secondary | ICD-10-CM

## 2023-10-23 DIAGNOSIS — R4182 Altered mental status, unspecified: Secondary | ICD-10-CM | POA: Diagnosis present

## 2023-10-23 DIAGNOSIS — F29 Unspecified psychosis not due to a substance or known physiological condition: Secondary | ICD-10-CM

## 2023-10-23 DIAGNOSIS — Z794 Long term (current) use of insulin: Secondary | ICD-10-CM

## 2023-10-23 DIAGNOSIS — F4310 Post-traumatic stress disorder, unspecified: Secondary | ICD-10-CM

## 2023-10-23 DIAGNOSIS — Y92009 Unspecified place in unspecified non-institutional (private) residence as the place of occurrence of the external cause: Secondary | ICD-10-CM

## 2023-10-23 DIAGNOSIS — G9341 Metabolic encephalopathy: Secondary | ICD-10-CM | POA: Diagnosis not present

## 2023-10-23 LAB — BLOOD CULTURE ID PANEL (REFLEXED) - BCID2

## 2023-10-23 LAB — GLUCOSE, CAPILLARY
Glucose-Capillary: 98 mg/dL (ref 70–99)
Glucose-Capillary: 145 mg/dL — ABNORMAL HIGH (ref 70–99)
Glucose-Capillary: 260 mg/dL — ABNORMAL HIGH (ref 70–99)
Glucose-Capillary: 173 mg/dL — ABNORMAL HIGH (ref 70–99)

## 2023-10-23 LAB — VITAMIN B1: Vitamin B1 (Thiamine): 66.6 nmol/L (ref 66.5–200.0)

## 2023-10-23 MED ORDER — HALOPERIDOL LACTATE 5 MG/ML IJ SOLN
2.0000 mg | Freq: Two times a day (BID) | INTRAMUSCULAR | Status: DC | PRN
  Administered 2023-10-25 – 2023-10-27 (×4): 2 mg via INTRAMUSCULAR
  Filled 2023-10-23 (×5): qty 1

## 2023-10-23 MED ORDER — QUETIAPINE FUMARATE ER 50 MG PO TB24
100.0000 mg | ORAL_TABLET | Freq: Every day | ORAL | Status: DC
  Administered 2023-10-23: 100 mg via ORAL
  Filled 2023-10-23 (×2): qty 2

## 2023-10-23 MED ORDER — QUETIAPINE FUMARATE 25 MG PO TABS
12.5000 mg | ORAL_TABLET | Freq: Two times a day (BID) | ORAL | Status: DC | PRN
  Administered 2023-10-23 – 2023-10-29 (×5): 12.5 mg via ORAL
  Filled 2023-10-23 (×6): qty 1

## 2023-10-23 MED ORDER — STERILE WATER FOR INJECTION IJ SOLN
INTRAMUSCULAR | Status: AC
  Filled 2023-10-23: qty 10

## 2023-10-23 NOTE — H&P (Addendum)
 PHARMACY - PHYSICIAN COMMUNICATION CRITICAL VALUE ALERT - BLOOD CULTURE IDENTIFICATION (BCID)  Lucas White is an 63 y.o. male who presented to [facility] on 10/19/2023 with a chief complaint of confusion and fall.  Assessment:   BCID detected GPC with no ID in 1/4 aerobic bottles from blood culture on 10/17. Patient is currently on ceftriaxone  and azithromycin  for CAP.   Based on patient's clinical stability (afebrile and WBC wnl) on current regimen and GPC has only been isolated in one set thus far, the results are consist with contamination.  Name of physician (or Provider) Contacted: Dr. Fredia Barrow  Current antibiotics: Ceftriaxone  and azithromycin   Patient is on recommended antibiotics - No changes needed  Results for orders placed or performed during the hospital encounter of 10/19/23  Blood Culture ID Panel (Reflexed) (Collected: 10/19/2023  8:05 AM)  Result Value Ref Range   Enterococcus faecalis NOT DETECTED NOT DETECTED   Enterococcus Faecium NOT DETECTED NOT DETECTED   Listeria monocytogenes NOT DETECTED NOT DETECTED   Staphylococcus species NOT DETECTED NOT DETECTED   Staphylococcus aureus (BCID) NOT DETECTED NOT DETECTED   Staphylococcus epidermidis NOT DETECTED NOT DETECTED   Staphylococcus lugdunensis NOT DETECTED NOT DETECTED   Streptococcus species NOT DETECTED NOT DETECTED   Streptococcus agalactiae NOT DETECTED NOT DETECTED   Streptococcus pneumoniae NOT DETECTED NOT DETECTED   Streptococcus pyogenes NOT DETECTED NOT DETECTED   A.calcoaceticus-baumannii NOT DETECTED NOT DETECTED   Bacteroides fragilis NOT DETECTED NOT DETECTED   Enterobacterales NOT DETECTED NOT DETECTED   Enterobacter cloacae complex NOT DETECTED NOT DETECTED   Escherichia coli NOT DETECTED NOT DETECTED   Klebsiella aerogenes NOT DETECTED NOT DETECTED   Klebsiella oxytoca NOT DETECTED NOT DETECTED   Klebsiella pneumoniae NOT DETECTED NOT DETECTED   Proteus species NOT DETECTED NOT  DETECTED   Salmonella species NOT DETECTED NOT DETECTED   Serratia marcescens NOT DETECTED NOT DETECTED   Haemophilus influenzae NOT DETECTED NOT DETECTED   Neisseria meningitidis NOT DETECTED NOT DETECTED   Pseudomonas aeruginosa NOT DETECTED NOT DETECTED   Stenotrophomonas maltophilia NOT DETECTED NOT DETECTED   Candida albicans NOT DETECTED NOT DETECTED   Candida auris NOT DETECTED NOT DETECTED   Candida glabrata NOT DETECTED NOT DETECTED   Candida krusei NOT DETECTED NOT DETECTED   Candida parapsilosis NOT DETECTED NOT DETECTED   Candida tropicalis NOT DETECTED NOT DETECTED   Cryptococcus neoformans/gattii NOT DETECTED NOT DETECTED    B. Maegan Addalee Kavanagh, PharmD PGY-1 Pharmacy Resident [facility] 10/23/2023 11:18 AM

## 2023-10-23 NOTE — Consult Note (Signed)
 Lucas White  Patient Name: .Lucas White  MRN: [id number]  DOB: [birth year]  Consult Order details:  Orders (From admission, onward)     Start     Ordered   10/20/23 0826  IP CONSULT TO PSYCHIATRY       Ordering Provider: Macario Dorothyann HERO, MD  Provider:  (Not yet assigned)  Question Answer Comment  Location [facility]   Reason for Consult? dementia with bipolar, on seroquel  and zyprexa  for agitation. presnted with confusion. consult for medication management. concern for NMS witth incontinence, delirium, mild CK elevation and rigidity.      10/20/23 0825             Mode of Visit: In person    Psychiatry Consult Evaluation  Service Date: October 23, 2023 LOS:  LOS: 3 days  Chief Complaint I have been going in and out of the hospital. I don't know where I am.   Primary Psychiatric Diagnoses  Dementia with behavioral disturbance with psychosis  Assessment  Lucas White is a 63 y.o. male admitted: Medically on  10/19/2023  7:46 AM for confusion and fall. He carries the psychiatric diagnoses of bipolar disorder, PTSD, dementia with behavioral issues with psychosis and has a past medical history of HLD, neuropathy, BP and T2DM. Psychiatry consulted for medication management and concerns for NMS.   His current presentation of chronic confusion and disorientation, intermittent agitation, tangential thought process and paucity of taught content in the context of the underlying fronto-temporal Dementia is most consistent with Dementia with behavioral disturbance and psychosis. He does not meet criteria for inpatient psychiatric admission based on absent severe agitation, disruptive behavior, suicidal or homicidal ideation, intent or plan. Current outpatient psychotropic medications include Olanzapine  7.5 mg twice daily and Depakote  125 mg three times daily.Historically, he has had some response to these medications.  Patient is  compliant with medications prior to admission as evidenced by reports from his wife in previous documentation. On initial examination, patient is alert and awake but is confused and disoriented to time, place, person or situation.He is tangential with paucity of thought. Please see plan below for detailed recommendations.   10/21/2023 Patient seen face to face in his hospital room. He is awake, alert and oriented x 1. Patient understands that he's in the hospital and was able to name some familial objects like pen and wristwatch, which is an improvement from the day before. However, patient remains confused and disoriented to time, person and situation. He could not recognize this Clinical research associate despite talking to him yesterday and could not fully participate in coherent and meaningful conversation. When asked about his wife, patient states he has not heard from her for a while, but could not answer follow up questions as patient remains tangential with paucity of thought. He however, denies suicidal or homicidal ideation. Collateral information from the treating nurse indicates patient gets agitated intermittently but has neither been out of control nor require intramuscular medications for the past few days. The nurse states further that patient has been compliant with his scheduled medications with improved fluid intake with CK level improving from 786 yesterday to 556 today.    10/22/2023 Patient oriented to self and hospital but was unable to discuss much further. Kept falling asleep. I spoke with patient's wife, seems like he's worse than baseline, likely delirious (was unable to conduct a thorough interview). I think this is pretty clearly an acute encephalopathy on top of a pretty significant  dementia picture. Wife notes worsening since they've moved. We're going to go ahead and restart patient's home Olanzapine , which he uses for agitation at home. We'll start at 2.5 tonight with BID dosing going forward  (he's been regularly getting PRN 2.5 mg at night for agitation anyhow). Hopefully that helps with sleep-wake and agitation generally, will avoid further PRNs, and will keep him out of restraints. Very low concern for NMS. We will continue to follow.   10/23/2023 CK increased slightly 556 --> 589 yesterday. Continue oral hydration. Patient required additional IM olanzapine  2.5 mg x1 overnight 10/20 for agitation, totaling 7.5 mg with scheduled PO. While patient was more alert, he was still oriented only to self and could follow commands only intermittently. Believes it is 38. Could name glasses but not pen. When asked if he could state where he was, patient began slowly reading individual words from the fall precautions poster at foot of bed. Grasping for meaningful words but trails of incoherently: I guess you could find out where. I think its asper.[place]art. Consistent with delirium. Given his neurologically tenuous state, the desire to avoid overly anti-cholinergic medication where possible, and wife's report of day-night disruption, we will transition from both PRN AND scheduled olanzapine  --> PRN AND scheduled quetiapine  for agitation. Wife amenable to this.   Diagnoses:  Active Hospital problems: Principal Problem:   Acute metabolic encephalopathy Active Problems:   Type 2 diabetes mellitus with hyperglycemia, with long-term current use of insulin  (HCC)   Obesity (BMI 30.0-34.9)   Chronic pain syndrome   Psychosis (HCC)   PTSD (post-traumatic stress disorder)   Aggressive behavior, adult   Dementia with behavioral disturbance (HCC)   Elevated CK   Pedal edema   Rigidity   Fall at home, initial encounter    Plan   ## Psychiatric Medication Recommendations:  -- Start seroquel  XR 100 mg at bedtime scheduled for agitation and improved sleep-wake in setting of acute encepalopathy. -- Start PRN seroquel  12.5 mg BID for agitation -- Start IM Haldol  2 mg BID for refractory agitation,  to be given only after seroquel  is given and deemed ineffective, document reasoning.    -- Stop Olanzapine  2.5 mg IM Q 12 HR as needed for psychotic agitation not amenable to verbal redirection -- Continue Depakote  125 mg twice daily for agitation/seizure prophylaxis -- Continue Lexapro  20 mg daily for mood/behavioral disturbance -- Please discontinue Ativan  or other BZD due to risk of worsening confusion and disinhibition -- Please encourage hydration and monitor CK daily.  --Continue other medical treatment as the primary team.   ## Medical Decision Making Capacity: Not specifically addressed in this encounter  ## Further Work-up:  --  TSH, B12, folate -- most recent EKG on 10/19/2023 had QtC of 432 -- Pertinent labwork reviewed earlier this admission includes: Mg-1.4, Albumin-3.3, CK-786    ## Disposition:-- There are no psychiatric contraindications to discharge at this time  ## Behavioral / Environmental: -Delirium Precautions: Delirium Interventions for Nursing and Staff: - RN to open blinds every AM. - To Bedside: Glasses, hearing aide, and pt's own shoes. Make available to patients. when possible and encourage use. - Encourage po fluids when appropriate, keep fluids within reach. - OOB to chair with meals. - Passive ROM exercises to all extremities with AM & PM care. - RN to assess orientation to person, time and place QAM and PRN. - Recommend extended visitation hours with familiar family/friends as feasible. - Staff to minimize disturbances at night. Turn off television when pt  asleep or when not in use.    ## Safety and Observation Level:  - Based on my clinical evaluation, I estimate the patient to be at low risk of self harm in the current setting. - At this time, we recommend  routine. This decision is based on my review of the chart including patient's history and current presentation, interview of the patient, mental status examination, and consideration of suicide risk  including evaluating suicidal ideation, plan, intent, suicidal or self-harm behaviors, risk factors, and protective factors. This judgment is based on our ability to directly address suicide risk, implement suicide prevention strategies, and develop a safety plan while the patient is in the clinical setting. Please contact our team if there is a concern that risk level has changed.  CSSR Risk Category:C-SSRS RISK CATEGORY: No Risk  Suicide Risk Assessment: Patient has following modifiable risk factors for suicide: triggering events and pain, medical illness (ie new dx of cancer), which we are addressing by recommending medications and providing support. Patient has following non-modifiable or demographic risk factors for suicide: male gender Patient has the following protective factors against suicide: Supportive family and Cultural, spiritual, or religious beliefs that discourage suicide  Thank you for this consult request. Recommendations have been communicated to the primary team.  We will continue to follow at this time.   Nikitta Sobiech, MD       History of Present Illness  Relevant Aspects of [facility] Course:   Patient Report:   On interview, patient and 2-point soft restraints and Posey belt -- about to eat breakfast.  Patient was oriented to his name but not to year (1999), place (I guess you could find out where.  I think it is asper...atart), or situation.  Patient largely unresponsive to inquiries. Patient was able to follow verbal command to close his eyes, but could not show me his tongue even with prompting and demonstration.  No stiffness, waxy flexibility, or catalepsy noted.  Collateral information:   On conversation with spouse China [phone]):   10/21: Visited patient yesterday. Patient still not at baseline. Uses zyprexa  7.5 mg BID for defiance and agitation at home. Very combative at baseline.  No meds to treat his dementia, understands there  is no treatment. Most of the time when you talk to him, you won't get a straight answer. Patient can sometimes be irritated at home, but he understands. Recently moved, however. More disoriented, behavioral dysregulation. Plan is to bring husband home.   10/22: Doesn't like his medicine adjusted, which is why they increased the Olanzapine . Something had to be changed in his medication. Sometimes stops and shakes his head when he gets confused, but wife can understand him. Lately, we're not clicking at all -- unable to make himself understood. More agitated at night when trying to go to bed -- wants to go out because he thinks. It's always night time. Sleeps during the day.   Review of Systems  Reason unable to perform ROS: Minmally responsive to questions, not always intelligible when he does respond.     Psychiatric and Social History  Psychiatric History:  Information collected from the patient  Prev Dx/Sx: Demential with behavioral disturbances. PTSD,Bipolar disorder.  Current Psych Provider: None Home Meds (current): Depakote  125 mg three times daily and Olanzapine  7.5 mg twice daily Previous Med Trials: as above Therapy: Unsure  Prior Psych Hospitalization: unsure  Prior Self Harm: unable to answer  Prior Violence: unable to answer   Family Psych History:  unable to answer  Family Hx suicide: unable to answer  Social History:  Educational Hx:  unable to answer Occupational Hx:  unable to answer Legal Hx:  unable to answer Living Situation:  unable to answer Spiritual Hx:  unable to answer Access to weapons/lethal means:   unable to answer  Substance History Alcohol: previously per record   Type of alcohol beer Last Drink unsure Number of drinks per day unsure History of alcohol withdrawal seizures unsure History of DT's unsure Tobacco: unsure Illicit drugs: unsure 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 rate 20, SpO2 100%. There is no height or weight on file to calculate BMI.  Physical Exam Vitals reviewed.  Constitutional:      General: He is not in acute distress.    Mental Status Exam: General Appearance: Casual, in two point restraints, laying in bed, variably attentive to interview,   Orientation:  Negative  Memory:  Grossly poor  Concentration:  Concentration: Poor and Attention Span: Poor  Recall:  Poor  Attention  Poor, attends conversation intermittently  Eye Contact:  Poor  Speech:  Garbled  Language:  Poor  Volume:  Normal  Mood: Patient did not answer when asked  Affect:  Flat  Thought Process:  Descriptions of Associations: Tangential   Thought Content:  paucity of thought  Suicidal Thoughts:  Unable to assess  Homicidal Thoughts:  Unable to assess  Judgement:  Other:  unable to assess  Insight:  unable to assess  Psychomotor Activity:  Decreased  Akathisia:  No  Fund of Knowledge:  Poor      Assets:  Others:  unable to participate in sustained conversation   Cognition:  Impaired,  Severe  ADL's:  Impaired  AIMS (if indicated):        Other History   These have been pulled in through the EMR, reviewed, and updated if appropriate.  Family History:  The patient's family history includes Diabetes in his father.  Medical History: Past Medical History:  Diagnosis Date   Chronic back pain greater than 3 months duration    Obesity (BMI 30.0-34.9)    Pneumonia due to COVID-19 virus 12/19/2018   Type 2 diabetes mellitus (HCC)     Surgical History: Past Surgical History:  Procedure Laterality Date   BACK SURGERY      COLONOSCOPY       Medications:   Current Facility-Administered Medications:    acetaminophen  (TYLENOL ) tablet 650 mg, 650 mg, Oral, Q6H PRN **OR** acetaminophen  (TYLENOL ) suppository 650 mg, 650 mg, Rectal, Q6H PRN, Tobie Yetta HERO, MD   azithromycin  (ZITHROMAX ) tablet 500 mg, 500 mg, Oral, Once, Pham, Minh Q, RPH-CPP   bisacodyl (DULCOLAX) suppository 10 mg, 10 mg, Rectal, Daily PRN, Patel, Pranav M, MD   cefTRIAXone  (ROCEPHIN ) 2 g in sodium chloride  0.9 % 100 mL IVPB, 2 g, Intravenous, Q24H, Tobie Yetta HERO, MD, Stopped at 10/22/23 1031   divalproex  (DEPAKOTE  SPRINKLE) capsule 125 mg, 125 mg, Oral, Q12H, Patel, Pranav M, MD, 125 mg at 10/22/23 2136   escitalopram  (LEXAPRO ) tablet 20 mg, 20 mg, Oral, Daily, Patel, Pranav  M, MD, 20 mg at 10/22/23 9042   gabapentin  (NEURONTIN ) capsule 300 mg, 300 mg, Oral, TID, Patel, Pranav M, MD, 300 mg at 10/22/23 2136   heparin injection 5,000 Units, 5,000 Units, Subcutaneous, Q8H, Patel, Pranav M, MD, 5,000 Units at 10/23/23 0503   insulin  aspart (novoLOG ) injection 0-5 Units, 0-5 Units, Subcutaneous, QHS, Pahwani, Ravi, MD   insulin  aspart (novoLOG ) injection 0-6 Units, 0-6 Units, Subcutaneous, TID WC, Pahwani, Ravi, MD, 1 Units at 10/22/23 1230   methadone  (DOLOPHINE ) tablet 20 mg, 20 mg, Oral, Q12H, Patel, Pranav M, MD, 20 mg at 10/22/23 2136   OLANZapine  (ZYPREXA ) injection 2.5 mg, 2.5 mg, Intramuscular, BID PRN, Akintayo, Musa A, MD, 2.5 mg at 10/23/23 0004   OLANZapine  (ZYPREXA ) tablet 2.5 mg, 2.5 mg, Oral, BID, Rollene Katz, MD, 2.5 mg at 10/22/23 2136   ondansetron  (ZOFRAN ) tablet 4 mg, 4 mg, Oral, Q6H PRN **OR** ondansetron  (ZOFRAN ) injection 4 mg, 4 mg, Intravenous, Q6H PRN, Patel, Pranav M, MD   pantoprazole  (PROTONIX ) EC tablet 40 mg, 40 mg, Oral, BID AC, Patel, Pranav M, MD, 40 mg at 10/22/23 1703   polyethylene glycol (MIRALAX  / GLYCOLAX ) packet 17 g, 17 g, Oral, Daily, Patel, Pranav M, MD, 17 g at 10/22/23 9043   tamsulosin  (FLOMAX )  capsule 0.4 mg, 0.4 mg, Oral, QHS, Patel, Pranav M, MD, 0.4 mg at 10/22/23 2136  Allergies: No Known Allergies  Theo Krumholz, MD

## 2023-10-23 NOTE — Care Management Important Message (Signed)
 Important Message  Patient Details  Name: Lucas White MRN: [id number] Date of Birth: March 09, 1960   Important Message Given:  Yes - Medicare IM     Claretta Deed 10/23/2023, 3:12 PM

## 2023-10-23 NOTE — TOC Initial Note (Addendum)
 Transition of Care [facility]) - Initial/Assessment Note    Patient Details  Name: Lucas White MRN: [id number] Date of Birth: Apr 16, 1960  [facility] CM/SW Contact:    Andrez JULIANNA George, RN Phone Number: 10/23/2023, 11:38 AM  Clinical Narrative:                 The patient with PMH of type II DM, bipolar disorder, dementia with behavioral issues with psychosis, HLD, neuropathy, BPH, PTSD presents the hospital with complaints of confusion and a fall.   Pt is from home with his wife and sons. Someone is with him all the time.  CM spoke to wife over the phone as pt is confused. She is in agreement with [facility] services after d/c. Wife is without a preference so CM has set this up with [facility]. Hedda will contact them for the first home visit. Orders for [facility] services sent to the [facility].  Shower seat ordered through the [facility] and will be delivered to pts home. CM will call pts SW: Nat through the [facility] to see if long term caregivers can be arranged.   [facility] aware of [facility] admission: [place] MD: Felisa PIESBETHA Nat [phone] ext [phone]  IP Care management following.  Expected Discharge Plan: Home w Home Health Services Barriers to Discharge: Continued Medical Work up   Patient Goals and CMS Choice   CMS Medicare.gov Compare Post Acute Care list provided to:: Patient Represenative (must comment) Choice offered to / list presented to : Spouse      Expected Discharge Plan and Services   Discharge Planning Services: CM Consult Post Acute Care Choice: Home Health, Durable Medical Equipment Living arrangements for the past 2 months: Single Family Home                           HH Arranged: RN, PT, OT, Nurse's Aide HH Agency: [facility] Date [facility] Agency Contacted: 10/23/23   Representative spoke with at [facility] Agency: Darleene  Prior Living Arrangements/Services Living arrangements for the past 2 months: Single Family Home Lives with:: Adult Children, Spouse Patient  language and need for interpreter reviewed:: Yes          Care giver support system in place?: Yes (comment)   Criminal Activity/Legal Involvement Pertinent to Current Situation/Hospitalization: No - Comment as needed  Activities of Daily Living      Permission Sought/Granted                  Emotional Assessment Appearance:: Appears stated age     Orientation: : Oriented to Self      Admission diagnosis:  Altered mental status, unspecified altered mental status type [R41.82] Acute metabolic encephalopathy [G93.41] Pneumonia of both lungs due to infectious organism, unspecified part of lung [J18.9] Patient Active Problem List   Diagnosis Date Noted   Elevated CK 10/19/2023   Pedal edema 10/19/2023   Rigidity 10/19/2023   Fall at home, initial encounter 10/19/2023   Dementia with behavioral disturbance (HCC) 04/29/2023   PTSD (post-traumatic stress disorder) 09/29/2021   Opioid use disorder, severe, on maintenance therapy, dependence (HCC) 09/29/2021   Aggressive behavior, adult 09/29/2021   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   Psychosis (HCC) 05/29/2020   Auditory hallucinations 05/29/2020   Visual hallucinations 05/29/2020   Alcohol withdrawal (HCC) 05/28/2020   Alcohol withdrawal delirium (HCC) 05/28/2020   Hypokalemia 02/18/2020   Chronic pain syndrome 02/18/2020  Acute metabolic encephalopathy 02/18/2020   Volume depletion 12/19/2018   Confusion and disorientation 12/19/2018   Type 2 diabetes mellitus with hyperglycemia, with long-term current use of insulin  (HCC)    Chronic back pain greater than 3 months duration    Obesity (BMI 30.0-34.9)    PCP:  Clinic, [facility] Pharmacy:   [id number] - [place], [place] - [address] AT [facility] OF [address] [place] [address] Phone: [phone] Fax: [phone]     Social Drivers of Health  (SDOH) Social History: SDOH Screenings   Alcohol Screen: Low Risk  (08/04/2021)  Tobacco Use: Low Risk  (10/19/2023)   SDOH Interventions:     Readmission Risk Interventions     No data to display

## 2023-10-23 NOTE — Progress Notes (Signed)
 PROGRESS NOTE    Lucas White  FMW:[id number] DOB: [birth year] DOA: 10/19/2023 PCP: [facility]   Brief Narrative:  The patient with PMH of type II DM, bipolar disorder, dementia with behavioral issues with psychosis, HLD, neuropathy, BPH, PTSD presented to [facility] 10/19/2023 with complaints of confusion, progressive generalized weakness, difficulty ambulating, poor p.o. intake, and fall. History was taken from wife and ED provider as the patient was unable to provide any history. Transferred to [facility] evening of 10/17 for weekend EEG.   Assessment & Plan:   Principal Problem:   Acute metabolic encephalopathy Active Problems:   Chronic pain syndrome   Type 2 diabetes mellitus with hyperglycemia, with long-term current use of insulin  (HCC)   Obesity (BMI 30.0-34.9)   Psychosis (HCC)   PTSD (post-traumatic stress disorder)   Aggressive behavior, adult   Dementia with behavioral disturbance (HCC)   Elevated CK   Pedal edema   Rigidity   Fall at home, initial encounter  Acute metabolic encephalopathy/Psychosis / PTSD / Aggressive behavior, adult / Dementia with behavioral disturbance: Still ongoing, oriented only to self.  EEG shows encephalopathy, no sign of seizures or epileptiform discharges.  CT head and MRI brain unremarkable.  Metabolic workup thus far negative aside from elevated procalcitonin, MRI brain unremarkable as well. Psych on board, continue home Lexapro , methadone  and continue gabapentin  at reduced dose 300 mg 3 times daily.  They have also restarted him on his previous dose of olanzapine  2.5 mg twice daily.  They are not concerned about NMS at this time.  Appreciate psychiatry help.   CAP CXR on admission showing mild diffuse interstitial opacities.  Continue Rocephin  and Zithromax  for total of 5 days.  Patient asymptomatic.   Fall at home, initial encounter Ambulate with assistance.  Seen by PT OT, home health recommended.   Elevated  CK / Rigidity: Does not have any significant rigidity on my exam today and CK only minimally elevated, does not meet criteria for NMS.  CK has been stable around 600.  Encouraged oral hydration.   Pedal edema Acute in nature per wife.  Bilateral lower extremity ultrasound ordered.  Chronic pain syndrome  On methadone  20 mg twice daily.  On reduced dose of gabapentin .   Type 2 diabetes mellitus with uncontrolled with hyperglycemia, with long-term current use of insulin  Hemoglobin A1c 7.6. Home regimen includes metformin  1000, empagliflozin , sitagliptin, Lantus  20 units.  Currently only on SSI.  Blood sugar controlled.   Obesity (BMI 30.0-34.9) Placing the patient at high risk of poor outcome. ABG does not show any significant hypercarbia.  Will need counseling for weight loss and diet modification once he is able to comprehend.   GERD. Continue PPI.   BPH. Continue Flomax .  DVT prophylaxis: heparin injection 5,000 Units Start: 10/19/23 1515 TED hose Start: 10/19/23 1504   Code Status: Full Code  Family Communication:  None present at bedside.    Status is: Inpatient Remains inpatient appropriate because: Still encephalopathic.   Estimated body mass index is 27.67 kg/m as calculated from the following:   Height as of 09/28/21: 5' 11 (1.803 m).   Weight as of 09/28/21: 90 kg.    Nutritional Assessment: There is no height or weight on file to calculate BMI.. Seen by dietician.  I agree with the assessment and plan as outlined below: Nutrition Status:        . Skin Assessment: I have examined the patient's skin and I agree with the wound assessment as  performed by the wound care RN as outlined below:    Consultants:  Psych   Procedures:  None none  Antimicrobials:  Anti-infectives (From admission, onward)    Start     Dose/Rate Route Frequency Ordered Stop   10/23/23 1000  azithromycin  (ZITHROMAX ) tablet 500 mg        500 mg Oral  Once 10/22/23 1133      10/20/23 1000  cefTRIAXone  (ROCEPHIN ) 2 g in sodium chloride  0.9 % 100 mL IVPB        2 g 200 mL/hr over 30 Minutes Intravenous Every 24 hours 10/19/23 1529     10/20/23 1000  azithromycin  (ZITHROMAX ) 500 mg in sodium chloride  0.9 % 250 mL IVPB  Status:  Discontinued        500 mg 250 mL/hr over 60 Minutes Intravenous Every 24 hours 10/19/23 1529 10/22/23 1133   10/19/23 1400  cefTRIAXone  (ROCEPHIN ) 2 g in sodium chloride  0.9 % 100 mL IVPB        2 g 200 mL/hr over 30 Minutes Intravenous  Once 10/19/23 1356 10/19/23 1647   10/19/23 1400  azithromycin  (ZITHROMAX ) 500 mg in sodium chloride  0.9 % 250 mL IVPB        500 mg 250 mL/hr over 60 Minutes Intravenous  Once 10/19/23 1358 10/19/23 1647         Subjective: Seen and examined.  RN at the bedside.  Patient alert and oriented to self only.  No complaints.  Objective: Vitals:   10/22/23 2100 10/22/23 2338 10/23/23 0450 10/23/23 0809  BP: (!) 151/93 128/80 124/81 106/69  Pulse: 98 80 100 77  Resp: 20 20 20 19   Temp: 99.2 F (37.3 C) 98.7 F (37.1 C) 98.4 F (36.9 C) 99.1 F (37.3 C)  TempSrc: Oral Oral Oral Axillary  SpO2: 97% 100% 100% 99%    Intake/Output Summary (Last 24 hours) at 10/23/2023 1001 Last data filed at 10/23/2023 0500 Gross per 24 hour  Intake 897.9 ml  Output 1750 ml  Net -852.1 ml   There were no vitals filed for this visit.  Examination:  General exam: Appears calm and comfortable  Respiratory system: Clear to auscultation. Respiratory effort normal. Cardiovascular system: S1 & S2 heard, RRR. No JVD, murmurs, rubs, gallops or clicks. No pedal edema. Gastrointestinal system: Abdomen is nondistended, soft and nontender. No organomegaly or masses felt. Normal bowel sounds heard. Central nervous system: Alert and oriented x 1. No focal neurological deficits. Extremities: Symmetric 5 x 5 power. Skin: No rashes, lesions or ulcers  Data Reviewed: I have personally reviewed following labs and imaging  studies  CBC: Recent Labs  Lab 10/19/23 0823 10/20/23 0519 10/21/23 0642  WBC 7.3 5.3 6.0  NEUTROABS 4.9 1.5*  --   HGB 11.6* 11.1* 11.3*  HCT 36.5* 33.6* 34.6*  MCV 89.7 87.5 88.5  PLT 400 349 371   Basic Metabolic Panel: Recent Labs  Lab 10/19/23 0823 10/20/23 0519 10/21/23 0642  NA 143 141 139  K 4.1 3.6 4.0  CL 103 103 102  CO2 30 27 25   GLUCOSE 103* 94 98  BUN 9 5* <5*  CREATININE 1.38* 1.06 1.16  CALCIUM 9.9 8.9 9.2  MG  --  1.4* 1.7   GFR: CrCl cannot be calculated (Unknown ideal weight.). Liver Function Tests: Recent Labs  Lab 10/19/23 0823 10/20/23 0519 10/21/23 0642  AST 28 23 21   ALT 8 11 13   ALKPHOS 90 75 78  BILITOT 0.3 0.8 0.6  PROT 7.7  6.5 7.1  ALBUMIN 4.3 3.3* 3.6   No results for input(s): LIPASE, AMYLASE in the last 168 hours. Recent Labs  Lab 10/19/23 1900  AMMONIA 62*   Coagulation Profile: Recent Labs  Lab 10/19/23 0823  INR 1.0   Cardiac Enzymes: Recent Labs  Lab 10/19/23 0823 10/19/23 1317 10/20/23 0519 10/21/23 0642 10/22/23 1108  CKTOTAL 798* 803* 786* 556* 589*   BNP (last 3 results) No results for input(s): PROBNP in the last 8760 hours. HbA1C: No results for input(s): HGBA1C in the last 72 hours.  CBG: Recent Labs  Lab 10/22/23 1138 10/22/23 1552 10/22/23 2112 10/22/23 2148 10/23/23 0629  GLUCAP 176* 115* 136* 123* 98   Lipid Profile: No results for input(s): CHOL, HDL, LDLCALC, TRIG, CHOLHDL, LDLDIRECT in the last 72 hours. Thyroid  Function Tests: No results for input(s): TSH, T4TOTAL, FREET4, T3FREE, THYROIDAB in the last 72 hours.  Anemia Panel: No results for input(s): VITAMINB12, FOLATE, FERRITIN, TIBC, IRON, RETICCTPCT in the last 72 hours.  Sepsis Labs: Recent Labs  Lab 10/19/23 9162 10/19/23 1116 10/19/23 1327 10/19/23 1900  PROCALCITON  --   --   --  >150.00  LATICACIDVEN 1.8 1.2 1.0  --     Recent Results (from the past 240 hours)   Blood Culture (routine x 2)     Status: None (Preliminary result)   Collection Time: 10/19/23  8:05 AM   Specimen: BLOOD  Result Value Ref Range Status   Specimen Description   Final    BLOOD LEFT ANTECUBITAL Performed at [facility], [address], [place], [place] [address]    Special Requests   Final    BOTTLES DRAWN AEROBIC AND ANAEROBIC Blood Culture adequate volume Performed at [facility], [address] [place], [place] [address]    Culture  Setup Time   Final    GRAM POSITIVE COCCI IN CLUSTERS AEROBIC BOTTLE ONLY Organism ID to follow Performed at [facility], [address], [place], [place] [address]    Culture GRAM POSITIVE COCCI  Final   Report Status PENDING  Incomplete  Blood Culture (routine x 2)     Status: None (Preliminary result)   Collection Time: 10/19/23  8:23 AM   Specimen: BLOOD  Result Value Ref Range Status   Specimen Description   Final    BLOOD RIGHT ANTECUBITAL Performed at [facility], [address] [place], [place] [address]    Special Requests   Final    BOTTLES DRAWN AEROBIC AND ANAEROBIC Blood Culture adequate volume Performed at [facility], [address] [place], [place] [address]    Culture   Final    NO GROWTH 4 DAYS Performed at [facility], [address] [place], [place] [address]    Report Status PENDING  Incomplete  Resp panel by RT-PCR (RSV, Flu A&B, Covid) Anterior Nasal Swab     Status: None   Collection Time: 10/20/23  1:50 AM   Specimen: Anterior Nasal Swab  Result Value Ref Range Status   SARS Coronavirus 2 by RT PCR NEGATIVE NEGATIVE Final   Influenza A by PCR NEGATIVE NEGATIVE Final   Influenza B by PCR NEGATIVE NEGATIVE Final    Comment: (NOTE) The Xpert Xpress SARS-CoV-2/FLU/RSV plus assay is intended as an aid in the diagnosis of influenza from Nasopharyngeal swab specimens and should not be used as a sole basis  for treatment. Nasal washings and aspirates are unacceptable for Xpert  Xpress SARS-CoV-2/FLU/RSV testing.  Fact Sheet for Patients: BloggerCourse.com  Fact Sheet for Healthcare Providers: SeriousBroker.it  This test is not yet approved or cleared by the [place]  FDA and has been authorized for detection and/or diagnosis of SARS-CoV-2 by FDA under an Emergency Use Authorization (EUA). This EUA will remain in effect (meaning this test can be used) for the duration of the COVID-19 declaration under Section 564(b)(1) of the Act, 21 U.S.C. section 360bbb-3(b)(1), unless the authorization is terminated or revoked.     Resp Syncytial Virus by PCR NEGATIVE NEGATIVE Final    Comment: (NOTE) Fact Sheet for Patients: BloggerCourse.com  Fact Sheet for Healthcare Providers: SeriousBroker.it  This test is not yet approved or cleared by the [place]  FDA and has been authorized for detection and/or diagnosis of SARS-CoV-2 by FDA under an Emergency Use Authorization (EUA). This EUA will remain in effect (meaning this test can be used) for the duration of the COVID-19 declaration under Section 564(b)(1) of the Act, 21 U.S.C. section 360bbb-3(b)(1), unless the authorization is terminated or revoked.  Performed at [facility], [address], [place], [place] [address]   Respiratory (~20 pathogens) panel by PCR     Status: None   Collection Time: 10/20/23  1:50 AM   Specimen: Nasopharyngeal Swab; Respiratory  Result Value Ref Range Status   Adenovirus NOT DETECTED NOT DETECTED Final   Coronavirus 229E NOT DETECTED NOT DETECTED Final    Comment: (NOTE) The Coronavirus on the Respiratory Panel, DOES NOT test for the novel  Coronavirus (2019 nCoV)    Coronavirus HKU1 NOT DETECTED NOT DETECTED Final   Coronavirus NL63 NOT DETECTED NOT DETECTED Final   Coronavirus OC43 NOT  DETECTED NOT DETECTED Final   Metapneumovirus NOT DETECTED NOT DETECTED Final   Rhinovirus / Enterovirus NOT DETECTED NOT DETECTED Final   Influenza A NOT DETECTED NOT DETECTED Final   Influenza B NOT DETECTED NOT DETECTED Final   Parainfluenza Virus 1 NOT DETECTED NOT DETECTED Final   Parainfluenza Virus 2 NOT DETECTED NOT DETECTED Final   Parainfluenza Virus 3 NOT DETECTED NOT DETECTED Final   Parainfluenza Virus 4 NOT DETECTED NOT DETECTED Final   Respiratory Syncytial Virus NOT DETECTED NOT DETECTED Final   Bordetella pertussis NOT DETECTED NOT DETECTED Final   Bordetella Parapertussis NOT DETECTED NOT DETECTED Final   Chlamydophila pneumoniae NOT DETECTED NOT DETECTED Final   Mycoplasma pneumoniae NOT DETECTED NOT DETECTED Final    Comment: Performed at [facility], [address], [place], [place] [address]     Radiology Studies: VAS US  LOWER EXTREMITY VENOUS (DVT) Result Date: 10/22/2023  Lower Venous DVT Study Patient Name:  Lucas White  Date of Exam:   10/22/2023 Medical Rec #: [id number]       Accession #:    [id number] Date of Birth: 06-28-1960       Patient Gender: M Patient Age:   63 years Exam Location:  [facility] Procedure:      VAS US  LOWER EXTREMITY VENOUS (DVT) Referring Phys: CATHERINE LYNN --------------------------------------------------------------------------------  Indications: Swelling, Edema, and confusion, fall. Other Indications: Weakness, difficulty ambulating. Limitations: AMS patient, could not properly position and trying to get out of bed during exam study. Comparison Study: No prior exam. Performing Technologist: Edilia Elden Appl  Examination Guidelines: A complete evaluation includes B-mode imaging, spectral Doppler, color Doppler, and power Doppler as needed of all accessible portions of each vessel. Bilateral testing is considered an integral part of a complete  examination. Limited examinations for reoccurring indications may be performed  as noted. The reflux portion of the exam is performed with the patient in reverse Trendelenburg.  +---------+---------------+---------+-----------+----------+--------------+ RIGHT    CompressibilityPhasicitySpontaneityPropertiesThrombus Aging +---------+---------------+---------+-----------+----------+--------------+ CFV      Full           Yes      Yes                                 +---------+---------------+---------+-----------+----------+--------------+ SFJ      Full           Yes      Yes                                 +---------+---------------+---------+-----------+----------+--------------+ FV Prox  Full                                                        +---------+---------------+---------+-----------+----------+--------------+ FV Mid   Full                                                        +---------+---------------+---------+-----------+----------+--------------+ FV DistalFull                                                        +---------+---------------+---------+-----------+----------+--------------+ PFV      Full                                                        +---------+---------------+---------+-----------+----------+--------------+ POP      Full           Yes      Yes                                 +---------+---------------+---------+-----------+----------+--------------+ PTV      Full                                                        +---------+---------------+---------+-----------+----------+--------------+ PERO     Full                                                        +---------+---------------+---------+-----------+----------+--------------+   +---------+---------------+---------+-----------+----------+--------------+ LEFT     CompressibilityPhasicitySpontaneityPropertiesThrombus Aging +---------+---------------+---------+-----------+----------+--------------+ CFV      Full  Yes      Yes                                 +---------+---------------+---------+-----------+----------+--------------+ SFJ      Full           Yes      Yes                                 +---------+---------------+---------+-----------+----------+--------------+ FV Prox  Full                                                        +---------+---------------+---------+-----------+----------+--------------+ FV Mid   Full                                                        +---------+---------------+---------+-----------+----------+--------------+ FV DistalFull                                                        +---------+---------------+---------+-----------+----------+--------------+ PFV      Full                                                        +---------+---------------+---------+-----------+----------+--------------+ POP      Full           Yes      Yes                                 +---------+---------------+---------+-----------+----------+--------------+ PTV      Full                                                        +---------+---------------+---------+-----------+----------+--------------+ PERO     Full                                                        +---------+---------------+---------+-----------+----------+--------------+    Summary: BILATERAL: - No evidence of deep vein thrombosis seen in the lower extremities, bilaterally. -No evidence of popliteal cyst, bilaterally.   *See table(s) above for measurements and observations.    Preliminary     Scheduled Meds:  azithromycin   500 mg Oral Once   divalproex   125 mg Oral Q12H   escitalopram   20 mg Oral Daily   gabapentin   300 mg Oral TID   heparin  5,000  Units Subcutaneous Q8H   insulin  aspart  0-5 Units Subcutaneous QHS   insulin  aspart  0-6 Units Subcutaneous TID WC   methadone   20 mg Oral Q12H   OLANZapine   2.5 mg Oral BID   pantoprazole   40 mg Oral BID AC    polyethylene glycol  17 g Oral Daily   tamsulosin   0.4 mg Oral QHS   Continuous Infusions:  cefTRIAXone  (ROCEPHIN )  IV Stopped (10/22/23 1031)     LOS: 3 days   Fredia Skeeter, MD [facility]  10/23/2023, 10:01 AM   *Please note that this is a verbal dictation therefore any spelling or grammatical errors are due to the Dragon Medical One system interpretation.  Please page via Amion and do not message via secure chat for urgent patient care matters. Secure chat can be used for non urgent patient care matters.  How to contact the [facility] Attending or Consulting provider 7A - 7P or covering provider during after hours 7P -7A, for this patient?  Check the care team in [facility] and look for a) attending/consulting [facility] provider listed and b) the [facility] team listed. Page or secure chat 7A-7P. Log into [email or web address] and use [place]'s universal password to access. If you do not have the password, please contact the hospital operator. Locate the [facility] provider you are looking for under [facility] and page to a number that you can be directly reached. If you still have difficulty reaching the provider, please page the [facility] (Director on Call) for the Hospitalists listed on amion for assistance.

## 2023-10-23 NOTE — Progress Notes (Signed)
 Physical Therapy Treatment Patient Details Name: Lucas White MRN: [id number] DOB: Nov 22, 1960 Today's Date: 10/23/2023   History of Present Illness Pt is a 63 y.o. male presenting 10/17 with generalized weakness, difficulty ambulating, incontinence, poor oral intake x 1 week. Found to have elevated CK, CXR with mild diffuse interstitial opacities, possibly related to sepsis. MRI brain negative. EEG showed encephalopathy. PMH: DMII, bipolar disorder, dementia with behavioral disturbance/psychosis, , HLD, neuropathy, PTSD.    PT Comments  Pt received in supine in bed with wrist restraints applied bilaterally and seemingly agreeable to PT. Pt was very pleasant and chatty, although nonsensical, throughout session. Pt was supervision for supine to sit, and CGA +2 for safety, no AD, for sit to stand and ambulation. Pt able to increase distance of ambulation to 520 feet (ambulated 465ft on 10/18). He required repetitive and straightforward verbal cues to begin walking, to turn, and back up to bed before sitting. Today, pt able to identify leaves and read names on a board in the hallway when given repetitive verbal and gestural cueing with goal of improving cervical extension and standing balance. Recommend HHPT to follow up with discharge to prevent functional decline and improve ADLs. Acute PT to follow.    If plan is discharge home, recommend the following: Assist for transportation;Help with stairs or ramp for entrance;Direct supervision/assist for medications management;Direct supervision/assist for financial management;A little help with bathing/dressing/bathroom;Assistance with cooking/housework   Can travel by private vehicle        Equipment Recommendations  None recommended by PT    Recommendations for Other Services       Precautions / Restrictions Precautions Precautions: Fall Recall of Precautions/Restrictions: Impaired Restrictions Weight Bearing Restrictions Per Provider Order: No      Mobility  Bed Mobility Overal bed mobility: Needs Assistance Bed Mobility: Supine to Sit     Supine to sit: Supervision     General bed mobility comments: for safety    Transfers Overall transfer level: Needs assistance Equipment used: None Transfers: Sit to/from Stand Sit to Stand: Contact guard assist, +2 safety/equipment           General transfer comment: CGA for safety, no physical assist    Ambulation/Gait     Assistive device: None Gait Pattern/deviations: Step-through pattern, Decreased stride length, Shuffle, Trunk flexed Gait velocity: Decr Gait velocity interpretation: <1.31 ft/sec, indicative of household Nurse, learning disability Bed    Modified Rankin (Stroke Patients Only)       Balance Overall balance assessment: Needs assistance Sitting-balance support: No upper extremity supported, Feet supported Sitting balance-Leahy Scale: Good     Standing balance support: No upper extremity supported Standing balance-Leahy Scale: Good                              Communication Communication Communication: Impaired Factors Affecting Communication: Difficulty expressing self  Cognition Arousal: Alert Behavior During Therapy: Flat affect   PT - Cognitive impairments: History of cognitive impairments, No family/caregiver present to determine baseline, Orientation, Awareness, Memory, Sequencing, Problem solving, Safety/Judgement, Initiation   Orientation impairments: Place, Time, Situation                   PT - Cognition Comments: Hx of dementia, was able to state name and city when asked. Unable to asnwer most questions  coherently. Inconsistent command following and required repeat cueing to achieve tasks. Following commands: Impaired Following commands impaired: Follows one step commands inconsistently, Follows one step commands with increased time    Cueing Cueing  Techniques: Verbal cues, Gestural cues, Tactile cues  Exercises      General Comments General comments (skin integrity, edema, etc.): Pt able to read names on a board when repetitvely verbally cued to do so. Also able to name leaves when asked what the leaf printout was. Pt demonstrated increased hand fidgeting during gait, which decreased with conversation about football and his wife.      Pertinent Vitals/Pain Pain Assessment Breathing: normal Negative Vocalization: none Facial Expression: smiling or inexpressive Body Language: tense, distressed pacing, fidgeting Consolability: no need to console PAINAD Score: 1    Home Living                          Prior Function            PT Goals (current goals can now be found in the care plan section) Acute Rehab PT Goals Patient Stated Goal: to go home PT Goal Formulation: With patient Time For Goal Achievement: 11/03/23 Potential to Achieve Goals: Good Progress towards PT goals: Progressing toward goals    Frequency    Min 1X/week      PT Plan      Co-evaluation              AM-PAC PT 6 Clicks Mobility   Outcome Measure  Help needed turning from your back to your side while in a flat bed without using bedrails?: None Help needed moving from lying on your back to sitting on the side of a flat bed without using bedrails?: A Little Help needed moving to and from a bed to a chair (including a wheelchair)?: A Little Help needed standing up from a chair using your arms (e.g., wheelchair or bedside chair)?: A Little Help needed to walk in hospital room?: A Little Help needed climbing 3-5 steps with a railing? : A Little 6 Click Score: 19    End of Session Equipment Utilized During Treatment: Gait belt Activity Tolerance: Patient tolerated treatment well Patient left: in bed;with restraints reapplied;with nursing/sitter in room;with bed alarm set Nurse Communication: Mobility status PT Visit  Diagnosis: Other abnormalities of gait and mobility (R26.89)     Time: 8948-8872 PT Time Calculation (min) (ACUTE ONLY): 36 min  Charges:    $Therapeutic Activity: 23-37 mins PT General Charges $$ ACUTE PT VISIT: 1 Visit                     Lucas White, SPT    Lucas White 10/23/2023, 1:38 PM

## 2023-10-24 DIAGNOSIS — G9341 Metabolic encephalopathy: Secondary | ICD-10-CM | POA: Diagnosis not present

## 2023-10-24 LAB — CULTURE, BLOOD (ROUTINE X 2)
Culture  Setup Time: NO GROWTH
Culture: NO GROWTH
Special Requests: ADEQUATE
Special Requests: ADEQUATE

## 2023-10-24 LAB — GLUCOSE, CAPILLARY
Glucose-Capillary: 152 mg/dL — ABNORMAL HIGH (ref 70–99)
Glucose-Capillary: 174 mg/dL — ABNORMAL HIGH (ref 70–99)
Glucose-Capillary: 126 mg/dL — ABNORMAL HIGH (ref 70–99)
Glucose-Capillary: 194 mg/dL — ABNORMAL HIGH (ref 70–99)

## 2023-10-24 LAB — CK: Total CK: 459 U/L — ABNORMAL HIGH (ref 49–397)

## 2023-10-24 MED ORDER — POLYETHYLENE GLYCOL 3350 17 G PO PACK: 17.0000 g | PACK | Freq: Every day | ORAL | Status: DC

## 2023-10-24 MED ORDER — SENNOSIDES-DOCUSATE SODIUM 8.6-50 MG PO TABS
2.0000 | ORAL_TABLET | Freq: Every day | ORAL | Status: DC
  Administered 2023-10-24: 2 via ORAL
  Filled 2023-10-24: qty 2

## 2023-10-24 MED ORDER — ENSURE PLUS HIGH PROTEIN PO LIQD
237.0000 mL | Freq: Two times a day (BID) | ORAL | Status: DC
  Administered 2023-10-25 – 2023-10-29 (×5): 237 mL via ORAL

## 2023-10-24 MED ORDER — LORAZEPAM 2 MG/ML IJ SOLN
2.0000 mg | Freq: Once | INTRAMUSCULAR | Status: AC
Start: 2023-10-24 — End: 2023-10-24
  Administered 2023-10-24: 2 mg via INTRAVENOUS
  Filled 2023-10-24: qty 1

## 2023-10-24 MED ORDER — BISACODYL 10 MG RE SUPP
10.0000 mg | Freq: Once | RECTAL | Status: AC
Start: 2023-10-24 — End: 2023-10-24
  Administered 2023-10-24: 10 mg via RECTAL
  Filled 2023-10-24: qty 1

## 2023-10-24 MED ORDER — QUETIAPINE FUMARATE ER 50 MG PO TB24
150.0000 mg | ORAL_TABLET | Freq: Every day | ORAL | Status: DC
  Administered 2023-10-24 – 2023-10-29 (×5): 150 mg via ORAL
  Filled 2023-10-24 (×6): qty 3

## 2023-10-24 MED ORDER — SODIUM CHLORIDE 0.9 % IV SOLN: INTRAVENOUS | Status: AC

## 2023-10-24 NOTE — Progress Notes (Signed)
 PROGRESS NOTE    Lucas White  FMW:[id number] DOB: 1960-09-18 DOA: 10/19/2023 PCP: [facility]   Brief Narrative:  The patient with PMH of type II DM, bipolar disorder, dementia with behavioral issues with psychosis, HLD, neuropathy, BPH, PTSD presented to [facility] [place] 10/19/2023 with complaints of confusion, progressive generalized weakness, difficulty ambulating, poor p.o. intake, and fall. History was taken from wife and ED provider as the patient was unable to provide any history. Transferred to [facility] evening of 10/17 for weekend EEG.   Assessment & Plan:   Principal Problem:   Acute metabolic encephalopathy Active Problems:   Chronic pain syndrome   Type 2 diabetes mellitus with hyperglycemia, with long-term current use of insulin  (HCC)   Obesity (BMI 30.0-34.9)   Psychosis (HCC)   PTSD (post-traumatic stress disorder)   Aggressive behavior, adult   Dementia with behavioral disturbance (HCC)   Elevated CK   Pedal edema   Rigidity   Fall at home, initial encounter   Altered mental status  Acute metabolic encephalopathy/Psychosis / PTSD / Aggressive behavior, adult / Dementia with behavioral disturbance: Still ongoing, oriented only to self.  EEG shows encephalopathy, no sign of seizures or epileptiform discharges.  CT head and MRI brain unremarkable.  Metabolic workup thus far negative aside from elevated procalcitonin, MRI brain unremarkable as well. Psych on board, continue home Lexapro , methadone  and continue gabapentin  at reduced dose 300 mg 3 times daily.  Patient was initially started on home dose of Zyprexa , that did not help, he was transition to Seroquel  on 10/23/2023, psychiatry managing.  They are not concerned about NMS at this time.  Patient was found to have high rigidity in all extremities.  This raises concern for possible catatonia.  Psychiatry residents were at the bedside and they are going to discuss this with their attending and  consider Ativan  challenge.  Appreciate psychiatry help.   CAP CXR on admission showing mild diffuse interstitial opacities.  Continue Rocephin  and Zithromax  for total of 5 days.  Patient asymptomatic.   Fall at home, initial encounter Ambulate with assistance.  Seen by PT OT, home health recommended.   Elevated CK / Rigidity: Does not have any significant rigidity on my exam today and CK only minimally elevated, does not meet criteria for NMS.  CK has been stable around 600.  Encouraged oral hydration but patient remains lethargic, doubt he is having enough oral hydration, will hydrate with normal saline 150 cc/h for next 10 hours today.  Repeat labs in the morning.   Pedal edema Acute in nature per wife.  Bilateral lower extremity ultrasound ordered.  Chronic pain syndrome  On methadone  20 mg twice daily.  On reduced dose of gabapentin .   Type 2 diabetes mellitus with uncontrolled with hyperglycemia, with long-term current use of insulin  Hemoglobin A1c 7.6. Home regimen includes metformin  1000, empagliflozin , sitagliptin, Lantus  20 units.  Currently only on SSI.  Blood sugar controlled.   Obesity (BMI 30.0-34.9) Placing the patient at high risk of poor outcome. ABG does not show any significant hypercarbia.  Will need counseling for weight loss and diet modification once he is able to comprehend.   GERD. Continue PPI.   BPH. Continue Flomax .  DVT prophylaxis: heparin injection 5,000 Units Start: 10/19/23 1515 TED hose Start: 10/19/23 1504   Code Status: Full Code  Family Communication:  None present at bedside.    Status is: Inpatient Remains inpatient appropriate because: Still encephalopathic.   Estimated body mass index is 27.67  kg/m as calculated from the following:   Height as of 09/28/21: 5' 11 (1.803 m).   Weight as of 09/28/21: 90 kg.    Nutritional Assessment: There is no height or weight on file to calculate BMI.. Seen by dietician.  I agree with the  assessment and plan as outlined below: Nutrition Status:        . Skin Assessment: I have examined the patient's skin and I agree with the wound assessment as performed by the wound care RN as outlined below:    Consultants:  Psych   Procedures:  None none  Antimicrobials:  Anti-infectives (From admission, onward)    Start     Dose/Rate Route Frequency Ordered Stop   10/23/23 1000  azithromycin  (ZITHROMAX ) tablet 500 mg        500 mg Oral  Once 10/22/23 1133 10/23/23 1124   10/20/23 1000  cefTRIAXone  (ROCEPHIN ) 2 g in sodium chloride  0.9 % 100 mL IVPB        2 g 200 mL/hr over 30 Minutes Intravenous Every 24 hours 10/19/23 1529     10/20/23 1000  azithromycin  (ZITHROMAX ) 500 mg in sodium chloride  0.9 % 250 mL IVPB  Status:  Discontinued        500 mg 250 mL/hr over 60 Minutes Intravenous Every 24 hours 10/19/23 1529 10/22/23 1133   10/19/23 1400  cefTRIAXone  (ROCEPHIN ) 2 g in sodium chloride  0.9 % 100 mL IVPB        2 g 200 mL/hr over 30 Minutes Intravenous  Once 10/19/23 1356 10/19/23 1647   10/19/23 1400  azithromycin  (ZITHROMAX ) 500 mg in sodium chloride  0.9 % 250 mL IVPB        500 mg 250 mL/hr over 60 Minutes Intravenous  Once 10/19/23 1358 10/19/23 1647         Subjective: Patient seen and examined, psychiatry residents were at the bedside.  Patient was still lethargic, able to speak but only repeating his name.  Objective: Vitals:   10/23/23 2021 10/24/23 0025 10/24/23 0357 10/24/23 0802  BP: 108/76 117/78 (!) 145/84 102/78  Pulse: 84 79 85 71  Resp: 19 19 17 18   Temp: 99.8 F (37.7 C) 98 F (36.7 C) 98.3 F (36.8 C) (!) 97.4 F (36.3 C)  TempSrc: Axillary Axillary Axillary Oral  SpO2: 91% 99% 97% 96%    Intake/Output Summary (Last 24 hours) at 10/24/2023 9161 Last data filed at 10/24/2023 0200 Gross per 24 hour  Intake 120 ml  Output 750 ml  Net -630 ml   There were no vitals filed for this visit.  Examination:  General exam: Appears calm  and comfortable  Respiratory system: Clear to auscultation. Respiratory effort normal. Cardiovascular system: S1 & S2 heard, RRR. No JVD, murmurs, rubs, gallops or clicks. No pedal edema. Gastrointestinal system: Abdomen is nondistended, soft and nontender. No organomegaly or masses felt. Normal bowel sounds heard. Central nervous system: Alert and oriented x 1.  Extremities: Symmetric 5 x 5 power. Skin: No rashes, lesions or ulcers  Data Reviewed: I have personally reviewed following labs and imaging studies  CBC: Recent Labs  Lab 10/19/23 0823 10/20/23 0519 10/21/23 0642  WBC 7.3 5.3 6.0  NEUTROABS 4.9 1.5*  --   HGB 11.6* 11.1* 11.3*  HCT 36.5* 33.6* 34.6*  MCV 89.7 87.5 88.5  PLT 400 349 371   Basic Metabolic Panel: Recent Labs  Lab 10/19/23 0823 10/20/23 0519 10/21/23 0642  NA 143 141 139  K 4.1 3.6 4.0  CL 103 103 102  CO2 30 27 25   GLUCOSE 103* 94 98  BUN 9 5* <5*  CREATININE 1.38* 1.06 1.16  CALCIUM 9.9 8.9 9.2  MG  --  1.4* 1.7   GFR: CrCl cannot be calculated (Unknown ideal weight.). Liver Function Tests: Recent Labs  Lab 10/19/23 0823 10/20/23 0519 10/21/23 0642  AST 28 23 21   ALT 8 11 13   ALKPHOS 90 75 78  BILITOT 0.3 0.8 0.6  PROT 7.7 6.5 7.1  ALBUMIN 4.3 3.3* 3.6   No results for input(s): LIPASE, AMYLASE in the last 168 hours. Recent Labs  Lab 10/19/23 1900  AMMONIA 62*   Coagulation Profile: Recent Labs  Lab 10/19/23 0823  INR 1.0   Cardiac Enzymes: Recent Labs  Lab 10/19/23 1317 10/20/23 0519 10/21/23 0642 10/22/23 1108 10/24/23 0500  CKTOTAL 803* 786* 556* 589* 459*   BNP (last 3 results) No results for input(s): PROBNP in the last 8760 hours. HbA1C: No results for input(s): HGBA1C in the last 72 hours.  CBG: Recent Labs  Lab 10/23/23 0629 10/23/23 1211 10/23/23 1643 10/23/23 2102 10/24/23 0611  GLUCAP 98 145* 260* 173* 152*   Lipid Profile: No results for input(s): CHOL, HDL, LDLCALC, TRIG,  CHOLHDL, LDLDIRECT in the last 72 hours. Thyroid  Function Tests: No results for input(s): TSH, T4TOTAL, FREET4, T3FREE, THYROIDAB in the last 72 hours.  Anemia Panel: No results for input(s): VITAMINB12, FOLATE, FERRITIN, TIBC, IRON, RETICCTPCT in the last 72 hours.  Sepsis Labs: Recent Labs  Lab 10/19/23 9162 10/19/23 1116 10/19/23 1327 10/19/23 1900  PROCALCITON  --   --   --  >150.00  LATICACIDVEN 1.8 1.2 1.0  --     Recent Results (from the past 240 hours)  Blood Culture (routine x 2)     Status: None (Preliminary result)   Collection Time: 10/19/23  8:05 AM   Specimen: BLOOD  Result Value Ref Range Status   Specimen Description   Final    BLOOD LEFT ANTECUBITAL Performed at [facility], [address], [place], [place] [address]    Special Requests   Final    BOTTLES DRAWN AEROBIC AND ANAEROBIC Blood Culture adequate volume Performed at [facility], [address], [place], [place] [address]    Culture  Setup Time   Final    GRAM POSITIVE COCCI IN CLUSTERS AEROBIC BOTTLE ONLY CRITICAL RESULT CALLED TO, READ BACK BY AND VERIFIED WITH: PHARMD M.LAMB AT 1040 ON 10/23/2023 BY T.SAAD. Performed at [facility], [address] [place], [place] [address]    Culture GRAM POSITIVE COCCI  Final   Report Status PENDING  Incomplete  Blood Culture ID Panel (Reflexed)     Status: None   Collection Time: 10/19/23  8:05 AM  Result Value Ref Range Status   Enterococcus faecalis NOT DETECTED NOT DETECTED Final   Enterococcus Faecium NOT DETECTED NOT DETECTED Final   Listeria monocytogenes NOT DETECTED NOT DETECTED Final   Staphylococcus species NOT DETECTED NOT DETECTED Final   Staphylococcus aureus (BCID) NOT DETECTED NOT DETECTED Final   Staphylococcus epidermidis NOT DETECTED NOT DETECTED Final   Staphylococcus lugdunensis NOT DETECTED NOT DETECTED Final   Streptococcus species NOT DETECTED NOT DETECTED  Final   Streptococcus agalactiae NOT DETECTED NOT DETECTED Final   Streptococcus pneumoniae NOT DETECTED NOT DETECTED Final   Streptococcus pyogenes NOT DETECTED NOT DETECTED Final   A.calcoaceticus-baumannii NOT DETECTED NOT DETECTED Final   Bacteroides fragilis NOT DETECTED NOT  DETECTED Final   Enterobacterales NOT DETECTED NOT DETECTED Final   Enterobacter cloacae complex NOT DETECTED NOT DETECTED Final   Escherichia coli NOT DETECTED NOT DETECTED Final   Klebsiella aerogenes NOT DETECTED NOT DETECTED Final   Klebsiella oxytoca NOT DETECTED NOT DETECTED Final   Klebsiella pneumoniae NOT DETECTED NOT DETECTED Final   Proteus species NOT DETECTED NOT DETECTED Final   Salmonella species NOT DETECTED NOT DETECTED Final   Serratia marcescens NOT DETECTED NOT DETECTED Final   Haemophilus influenzae NOT DETECTED NOT DETECTED Final   Neisseria meningitidis NOT DETECTED NOT DETECTED Final   Pseudomonas aeruginosa NOT DETECTED NOT DETECTED Final   Stenotrophomonas maltophilia NOT DETECTED NOT DETECTED Final   Candida albicans NOT DETECTED NOT DETECTED Final   Candida auris NOT DETECTED NOT DETECTED Final   Candida glabrata NOT DETECTED NOT DETECTED Final   Candida krusei NOT DETECTED NOT DETECTED Final   Candida parapsilosis NOT DETECTED NOT DETECTED Final   Candida tropicalis NOT DETECTED NOT DETECTED Final   Cryptococcus neoformans/gattii NOT DETECTED NOT DETECTED Final    Comment: Performed at [facility], [address] [place], [place] [address]  Blood Culture (routine x 2)     Status: None   Collection Time: 10/19/23  8:23 AM   Specimen: BLOOD  Result Value Ref Range Status   Specimen Description   Final    BLOOD RIGHT ANTECUBITAL Performed at [facility], [address], [place], [place] [address]    Special Requests   Final    BOTTLES DRAWN AEROBIC AND ANAEROBIC Blood Culture adequate volume Performed at [facility], [address] [place], [place] [address]    Culture   Final    NO GROWTH 5 DAYS Performed at [facility], [address] [place], [place] [address]    Report Status 10/24/2023 FINAL  Final  Resp panel by RT-PCR (RSV, Flu A&B, Covid) Anterior Nasal Swab     Status: None   Collection Time: 10/20/23  1:50 AM   Specimen: Anterior Nasal Swab  Result Value Ref Range Status   SARS Coronavirus 2 by RT PCR NEGATIVE NEGATIVE Final   Influenza A by PCR NEGATIVE NEGATIVE Final   Influenza B by PCR NEGATIVE NEGATIVE Final    Comment: (NOTE) The Xpert Xpress SARS-CoV-2/FLU/RSV plus assay is intended as an aid in the diagnosis of influenza from Nasopharyngeal swab specimens and should not be used as a sole basis for treatment. Nasal washings and aspirates are unacceptable for Xpert Xpress SARS-CoV-2/FLU/RSV testing.  Fact Sheet for Patients: BloggerCourse.com  Fact Sheet for Healthcare Providers: SeriousBroker.it  This test is not yet approved or cleared by the [place]  FDA and has been authorized for detection and/or diagnosis of SARS-CoV-2 by FDA under an Emergency Use Authorization (EUA). This EUA will remain in effect (meaning this test can be used) for the duration of the COVID-19 declaration under Section 564(b)(1) of the Act, 21 U.S.C. section 360bbb-3(b)(1), unless the authorization is terminated or revoked.     Resp Syncytial Virus by PCR NEGATIVE NEGATIVE Final    Comment: (NOTE) Fact Sheet for Patients: BloggerCourse.com  Fact Sheet for Healthcare Providers: SeriousBroker.it  This test is not yet approved or cleared by the [place]  FDA and has been authorized for detection and/or diagnosis of SARS-CoV-2 by FDA under an Emergency Use Authorization (EUA). This EUA will remain in effect (meaning this test can be used) for the duration of the COVID-19 declaration  under Section 564(b)(1) of the Act, 21 U.S.C. section 360bbb-3(b)(1), unless the authorization is terminated or revoked.  Performed at [facility], [address], [place], [place] [address]   Respiratory (~20 pathogens) panel by PCR     Status: None   Collection Time: 10/20/23  1:50 AM   Specimen: Nasopharyngeal Swab; Respiratory  Result Value Ref Range Status   Adenovirus NOT DETECTED NOT DETECTED Final   Coronavirus 229E NOT DETECTED NOT DETECTED Final    Comment: (NOTE) The Coronavirus on the Respiratory Panel, DOES NOT test for the novel  Coronavirus (2019 nCoV)    Coronavirus HKU1 NOT DETECTED NOT DETECTED Final   Coronavirus NL63 NOT DETECTED NOT DETECTED Final   Coronavirus OC43 NOT DETECTED NOT DETECTED Final   Metapneumovirus NOT DETECTED NOT DETECTED Final   Rhinovirus / Enterovirus NOT DETECTED NOT DETECTED Final   Influenza A NOT DETECTED NOT DETECTED Final   Influenza B NOT DETECTED NOT DETECTED Final   Parainfluenza Virus 1 NOT DETECTED NOT DETECTED Final   Parainfluenza Virus 2 NOT DETECTED NOT DETECTED Final   Parainfluenza Virus 3 NOT DETECTED NOT DETECTED Final   Parainfluenza Virus 4 NOT DETECTED NOT DETECTED Final   Respiratory Syncytial Virus NOT DETECTED NOT DETECTED Final   Bordetella pertussis NOT DETECTED NOT DETECTED Final   Bordetella Parapertussis NOT DETECTED NOT DETECTED Final   Chlamydophila pneumoniae NOT DETECTED NOT DETECTED Final   Mycoplasma pneumoniae NOT DETECTED NOT DETECTED Final    Comment: Performed at [facility], [address], [place], [place] [address]     Radiology Studies: VAS US  LOWER EXTREMITY VENOUS (DVT) Result Date: 10/23/2023  Lower Venous DVT Study Patient Name:  RHYAN RADLER  Date of Exam:   10/22/2023 Medical Rec #: [id number]       Accession #:    [id number] Date of Birth: 05-15-1960       Patient Gender: M Patient Age:   63 years Exam Location:  [facility] Procedure:      VAS US  LOWER EXTREMITY  VENOUS (DVT) Referring Phys: CATHERINE LYNN --------------------------------------------------------------------------------  Indications: Swelling, Edema, and confusion, fall. Other Indications: Weakness, difficulty ambulating. Limitations: AMS patient, could not properly position and trying to get out of bed during exam study. Comparison Study: No prior exam. Performing Technologist: Edilia Elden Appl  Examination Guidelines: A complete evaluation includes B-mode imaging, spectral Doppler, color Doppler, and power Doppler as needed of all accessible portions of each vessel. Bilateral testing is considered an integral part of a complete examination. Limited examinations for reoccurring indications may be performed as noted. The reflux portion of the exam is performed with the patient in reverse Trendelenburg.  +---------+---------------+---------+-----------+----------+--------------+ RIGHT    CompressibilityPhasicitySpontaneityPropertiesThrombus Aging +---------+---------------+---------+-----------+----------+--------------+ CFV      Full           Yes      Yes                                 +---------+---------------+---------+-----------+----------+--------------+ SFJ      Full           Yes      Yes                                 +---------+---------------+---------+-----------+----------+--------------+ FV Prox  Full                                                        +---------+---------------+---------+-----------+----------+--------------+  FV Mid   Full                                                        +---------+---------------+---------+-----------+----------+--------------+ FV DistalFull                                                        +---------+---------------+---------+-----------+----------+--------------+ PFV      Full                                                         +---------+---------------+---------+-----------+----------+--------------+ POP      Full           Yes      Yes                                 +---------+---------------+---------+-----------+----------+--------------+ PTV      Full                                                        +---------+---------------+---------+-----------+----------+--------------+ PERO     Full                                                        +---------+---------------+---------+-----------+----------+--------------+   +---------+---------------+---------+-----------+----------+--------------+ LEFT     CompressibilityPhasicitySpontaneityPropertiesThrombus Aging +---------+---------------+---------+-----------+----------+--------------+ CFV      Full           Yes      Yes                                 +---------+---------------+---------+-----------+----------+--------------+ SFJ      Full           Yes      Yes                                 +---------+---------------+---------+-----------+----------+--------------+ FV Prox  Full                                                        +---------+---------------+---------+-----------+----------+--------------+ FV Mid   Full                                                        +---------+---------------+---------+-----------+----------+--------------+  FV DistalFull                                                        +---------+---------------+---------+-----------+----------+--------------+ PFV      Full                                                        +---------+---------------+---------+-----------+----------+--------------+ POP      Full           Yes      Yes                                 +---------+---------------+---------+-----------+----------+--------------+ PTV      Full                                                         +---------+---------------+---------+-----------+----------+--------------+ PERO     Full                                                        +---------+---------------+---------+-----------+----------+--------------+     Summary: BILATERAL: - No evidence of deep vein thrombosis seen in the lower extremities, bilaterally. -No evidence of popliteal cyst, bilaterally.   *See table(s) above for measurements and observations. Electronically signed by Lonni Gaskins MD on 10/23/2023 at 10:21:55 AM.    Final     Scheduled Meds:  divalproex   125 mg Oral Q12H   escitalopram   20 mg Oral Daily   gabapentin   300 mg Oral TID   heparin  5,000 Units Subcutaneous Q8H   insulin  aspart  0-5 Units Subcutaneous QHS   insulin  aspart  0-6 Units Subcutaneous TID WC   methadone   20 mg Oral Q12H   pantoprazole   40 mg Oral BID AC   polyethylene glycol  17 g Oral Daily   QUEtiapine   100 mg Oral QHS   tamsulosin   0.4 mg Oral QHS   Continuous Infusions:  cefTRIAXone  (ROCEPHIN )  IV 2 g (10/23/23 1125)     LOS: 4 days   Fredia Skeeter, MD [facility]  10/24/2023, 8:38 AM   *Please note that this is a verbal dictation therefore any spelling or grammatical errors are due to the Dragon Medical One system interpretation.  Please page via Amion and do not message via secure chat for urgent patient care matters. Secure chat can be used for non urgent patient care matters.  How to contact the [facility] Attending or Consulting provider 7A - 7P or covering provider during after hours 7P -7A, for this patient?  Check the care team in [facility] and look for a) attending/consulting [facility] provider listed and b) the [facility] team listed. Page or secure chat 7A-7P. Log into [email or web address] and use [place]'s universal password to access. If you do not  have the password, please contact the hospital operator. Locate the [facility] provider you are looking for under [facility] and page to a number that you can be directly  reached. If you still have difficulty reaching the provider, please page the [facility] (Director on Call) for the Hospitalists listed on amion for assistance.

## 2023-10-24 NOTE — Progress Notes (Signed)
 OT Cancellation Note  Patient Details Name: Lucas White MRN: [id number] DOB: September 05, 1960   Cancelled Treatment:    Reason Eval/Treat Not Completed: Fatigue/lethargy limiting ability to participate. Pt sleeping heavily on arrival and difficulty rousing with max stim; spoke with RN who reports ativan  administered around 1330 this afternoon and family also visited prior to this. Will follow up as schedule allows.   Jac Romulus D Walton, OTD, OTR/L [facility] Office: [phone]   Elma JONETTA Penner 10/24/2023, 5:30 PM

## 2023-10-24 NOTE — Consult Note (Addendum)
 [facility] Consult Follow-up  Patient Name: .Lucas White  MRN: [id number]  DOB: 1960/11/06  Consult Order details:  Orders (From admission, onward)     Start     Ordered   10/20/23 0826  IP CONSULT TO PSYCHIATRY       Ordering Provider: Macario Dorothyann HERO, MD  Provider:  (Not yet assigned)  Question Answer Comment  Location [facility]   Reason for Consult? dementia with bipolar, on seroquel  and zyprexa  for agitation. presnted with confusion. consult for medication management. concern for NMS witth incontinence, delirium, mild CK elevation and rigidity.      10/20/23 0825             Mode of Visit: In person    Psychiatry Consult Evaluation  Service Date: October 24, 2023 LOS:  LOS: 4 days  Chief Complaint I am in a place  Primary Psychiatric Diagnoses  Dementia with behavioral disturbance with psychosis  Assessment  [name] is a 63 y.o. male admitted: Medically on  10/19/2023  7:46 AM for confusion and fall. He carries the psychiatric diagnoses of bipolar disorder, PTSD, dementia with behavioral issues with psychosis and has a past medical history of HLD, neuropathy, BP and T2DM. Psychiatry consulted for medication management and concerns for NMS.   His current presentation of chronic confusion and disorientation, intermittent agitation, tangential thought process and paucity of taught content in the context of the underlying fronto-temporal Dementia is most consistent with Dementia with behavioral disturbance and psychosis. He does not meet criteria for inpatient psychiatric admission based on absent severe agitation, disruptive behavior, suicidal or homicidal ideation, intent or plan. Current outpatient psychotropic medications include Olanzapine  7.5 mg twice daily and Depakote  125 mg three times daily.Historically, he has had some response to these medications.  Patient is compliant with medications prior to admission as evidenced by  reports from his wife in previous documentation. On initial examination, patient is alert and awake but is confused and disoriented to time, place, person or situation.He is tangential with paucity of thought. Please see plan below for detailed recommendations.   10/21/2023 Patient seen face to face in his hospital room. He is awake, alert and oriented x 1. Patient understands that he's in the hospital and was able to name some familial objects like pen and wristwatch, which is an improvement from the day before. However, patient remains confused and disoriented to time, person and situation. He could not recognize this Clinical research associate despite talking to him yesterday and could not fully participate in coherent and meaningful conversation. When asked about his wife, patient states he has not heard from her for a while, but could not answer follow up questions as patient remains tangential with paucity of thought. He however, denies suicidal or homicidal ideation. Collateral information from the treating nurse indicates patient gets agitated intermittently but has neither been out of control nor require intramuscular medications for the past few days. The nurse states further that patient has been compliant with his scheduled medications with improved fluid intake with CK level improving from 786 yesterday to 556 today.    10/22/2023 Patient oriented to self and hospital but was unable to discuss much further. Kept falling asleep. I spoke with patient's wife, seems like he's worse than baseline, likely delirious (was unable to conduct a thorough interview). I think this is pretty clearly an acute encephalopathy on top of a pretty significant dementia picture. Wife notes worsening since they've moved. We're going to go  ahead and restart patient's home Olanzapine , which he uses for agitation at home. We'll start at 2.5 tonight with BID dosing going forward (he's been regularly getting PRN 2.5 mg at night for agitation  anyhow). Hopefully that helps with sleep-wake and agitation generally, will avoid further PRNs, and will keep him out of restraints. Very low concern for NMS. We will continue to follow.   10/23/2023 CK increased slightly 556 --> 589 yesterday. Continue oral hydration. Patient required additional IM olanzapine  2.5 mg x1 overnight 10/20 for agitation, totaling 7.5 mg with scheduled PO. While patient was more alert, he was still oriented only to self and could follow commands only intermittently. Believes it is 17. Could name glasses but not pen. When asked if he could state where he was, patient began slowly reading individual words from the fall precautions poster at foot of bed. Grasping for meaningful words but trails of incoherently: I guess you could find out where. I think its asper.SABRA[place]art. Consistent with delirium. Given his neurologically tenuous state, the desire to avoid overly anti-cholinergic medication where possible, and wife's report of day-night disruption, we will transition from both PRN AND scheduled olanzapine  --> PRN AND scheduled quetiapine  for agitation. Wife amenable to this.   10/24/2023  Patient was evaluated for catatonia picture-after approximately 30 minutes after IV Ativan  2 mg x1, patient fell swiftly to sleep.  Negative Ativan  challenge.  Believe current presentation consistent with worsened dementia with overlying delirium secondary to metabolic causes.  Of note, patient's nurse Kathrine Chard notes that patient has not passed a bowel movement in 5 days, which may be contributing to his poor p.o. intake.  Will alert primary team.  Otherwise we will increase Seroquel  to 150 mg XL to be given at 6 PM -- patient required as needed Seroquel  at that time, hopefully this will reduce need for PRNs.  No further changes for now.  PT believes patient will be fit to go home with home health 11/1.  Diagnoses:  Active Hospital problems: Principal Problem:   Acute metabolic  encephalopathy Active Problems:   Type 2 diabetes mellitus with hyperglycemia, with long-term current use of insulin  (HCC)   Obesity (BMI 30.0-34.9)   Chronic pain syndrome   Psychosis (HCC)   PTSD (post-traumatic stress disorder)   Aggressive behavior, adult   Dementia with behavioral disturbance (HCC)   Elevated CK   Pedal edema   Rigidity   Fall at home, initial encounter   Altered mental status    Plan   ## Psychiatric Medication Recommendations:  -- Increase seroquel  XR 100 mg-150 mg at 6 p.m. scheduled for agitation and improved sleep-wake in setting of acute encepalopathy. -- Continue PRN seroquel  12.5 mg BID for agitation -- Continue IM Haldol  2 mg BID for refractory agitation, to be given only after seroquel  is given and deemed ineffective, document reasoning.    -- Continue Lexapro  20 mg daily for mood/behavioral disturbance -- Please discontinue Ativan  or other BZD due to risk of worsening confusion and disinhibition -- Please encourage hydration and monitor CK daily.  --Continue other medical treatment as the primary team.   ## Medical Decision Making Capacity: Not specifically addressed in this encounter  ## Further Work-up:  --  TSH, B12, folate -- most recent EKG on 10/19/2023 had QtC of 432 -- Pertinent labwork reviewed earlier this admission includes: Mg-1.4, Albumin-3.3, CK-786    ## Disposition:-- There are no psychiatric contraindications to discharge at this time  ## Behavioral / Environmental: -Delirium Precautions: Delirium Interventions  for Nursing and Staff: - RN to open blinds every AM. - To Bedside: Glasses, hearing aide, and pt's own shoes. Make available to patients. when possible and encourage use. - Encourage po fluids when appropriate, keep fluids within reach. - OOB to chair with meals. - Passive ROM exercises to all extremities with AM & PM care. - RN to assess orientation to person, time and place QAM and PRN. - Recommend extended visitation  hours with familiar family/friends as feasible. - Staff to minimize disturbances at night. Turn off television when pt asleep or when not in use.    ## Safety and Observation Level:  - Based on my clinical evaluation, I estimate the patient to be at low risk of self harm in the current setting. - At this time, we recommend  routine. This decision is based on my review of the chart including patient's history and current presentation, interview of the patient, mental status examination, and consideration of suicide risk including evaluating suicidal ideation, plan, intent, suicidal or self-harm behaviors, risk factors, and protective factors. This judgment is based on our ability to directly address suicide risk, implement suicide prevention strategies, and develop a safety plan while the patient is in the clinical setting. Please contact our team if there is a concern that risk level has changed.  CSSR Risk Category:C-SSRS RISK CATEGORY: No Risk  Suicide Risk Assessment: Patient has following modifiable risk factors for suicide: triggering events and pain, medical illness (ie new dx of cancer), which we are addressing by recommending medications and providing support. Patient has following non-modifiable or demographic risk factors for suicide: male gender Patient has the following protective factors against suicide: Supportive family and Cultural, spiritual, or religious beliefs that discourage suicide  Thank you for this consult request. Recommendations have been communicated to the primary team.  We will continue to follow at this time.   Naelle Diegel, MD       History of Present Illness  Relevant Aspects of Hospital Course:   Patient Report:   On initial interview, patient lethargic, in soft restraints, with bilateral mittens.  Minimally responsive although was able to state his own name.  Would occasionally repeat words.  Notable increased lower extremity muscle tone.  Given  patient's behavior, underwent evaluation for catatonia as below:  Bush-Francis Catatonia Rating Scale Assessment   Excitement: Extreme hyperactivity, constant motor unrest which is apparently non-purposeful. Not to be attributed to akathisia or goal-directed agitation. 0 = Absent  Immobility/stupor: Extreme hypoactivity, immobile, minimally responsive to stimuli. 0 - Absent.  Mutism: Verbally unresponsive or minimally responsive. 0 = Absent.  Staring: Fixed gaze, little or no visual scanning of environment, decreased blinking. 1 = Poor eye contact, repeatedly gazes less than 20 s between shifting of attention; decreased  blinking.   Posturing/catalepsy: Spontaneous maintenance of posture (s), including mundane (e.g. sitting or standing for long periods without reacting). 2 = Greater than one minute, less than 15 min.  Grimacing: Maintenance of odd facial expressions. 2 = Less than 1 min.  Echopraxia/echolalia: Mimicking of examiner's movements (echopraxia) or speech (echolalia). 2 = Frequent  Stereotypy: Repetitive, non-goal-directed motor activity (e.g. finger-play; repeatedly touching, patting or rubbing self); abnormality not inherent in act but in its frequency. 0 = Absent  Mannerisms: Odd, purposeful movements (hopping or walking tiptoe, saluting passersby or exaggerated caricatures of mundane movements); abnormality inherent in act itself. 0 = Absent  Verbigeration: Repetition of phrases or sentences (like a scratched record). 1 = Occasional  Rigidity: Maintenance of  a rigid position despite efforts to be moved, exclude if cog-wheeling or tremor present. 2 = Moderate  Negativism: Apparently motiveless resistance to instructions or attempts to move/examine patient.  Contrary behavior, does exact opposite of instruction. 0 = Absent  Waxy flexibility: During reposturing of patient, patient offers initial resistance before allowing himself to be repositioned, similar to that  of a bending candle. 0 = Absent  Withdrawal: refusal to eat, drink and/or make eye contact. 2 = Minimal PO intake/ interaction for more than one day  Above are screening questions: if >= 2 are positive, full scale should be completed  -------------------------------------------------------------------------------------------------------------------------------------  Impulsivity: Patient suddenly engages in inappropriate behavior (e.g. runs down hallway, starts screaming or takes off clothes) without provocation.  Afterwards can give no, or only a facile explanation. 0 = Absent  Automatic obedience: Exaggerated cooperation with examiner's request or spontaneous continuation of movement requested. 0 = Absent  Mitgehen: Anglepoise lamp arm raising in response to light pressure of finger, despite instructions to the contrary. Patient restrained  Gegenhalten: Resistance to passive movement which is proportional to strength of the stimulus, appears automatic rather than willful. 3 = Present  Ambitendency: Patient appears motorically stuck in indecisive, hesitant movement. 0 = Absent  Grasp reflex: Per neurological exam. 3 = Present  Perseveration: Repeatedly returns to same topic or persists with movement. 0 = Absent  Combativeness: Usually in an undirected manner, with no, or only a facile explanation afterwards. 0 = Absent  Autonomic abnormality: temperature, BP, pulse, respiratory rate, diaphoresis. 0 = Absent  Severity Score (Number of points for items 1 -23): 18 Screening Score (Presence or absence of items 1 - 14): 8 (score of 2 or more is positive for catatonia)    MANAGEMENT First line management for catatonia is a trial of lorazepam , which may be given PO, IM, or IV depending on the patient's condition (IV having the quickest response time). An accurate lorazepam  trial is given as 1-2 mg, then response is monitored over three hours. If there is no response and no  adverse effects, such as respiratory depression, sedation, or further impaired consciousness, then another lorazepam  dose can be given with response monitored over 3 hours. Decrease in the Bush-Francis Catatonia Rating Scale by 50% indicates positive treatment response Clive 2016).  Later in the day, patient became more interactive, responsive.  Able to engage conversation and ate some.  After IV Ativan  challenge, indicated for above [employer] scoring, patient went softly to sleep.  No respiratory depression noted.  Result of Ativan  challenge is not consistent with a diagnosis of catatonia.   Collateral information:   On conversation with spouse Curtiss Mahmood [phone]):   10/20: Visited patient yesterday. Patient still not at baseline. Uses zyprexa  7.5 mg BID for defiance and agitation at home. Very combative at baseline.  No meds to treat his dementia, understands there is no treatment. Most of the time when you talk to him, you won't get a straight answer. Patient can sometimes be irritated at home, but he understands. Recently moved, however. More disoriented, behavioral dysregulation. Plan is to bring husband home.   10/21: Doesn't like his medicine adjusted, which is why they increased the Olanzapine . Something had to be changed in his medication. Sometimes stops and shakes his head when he gets confused, but wife can understand him. Lately, we're not clicking at all -- unable to make himself understood. More agitated at night when trying to go to bed -- wants to go out because he  thinks. It's always night time. Sleeps during the day.   10/22: Discussed with both wife and mother the following topics: patients current condition, previous condition, current prognosis, the etiology of catatonia, the diagnostic workup of catatonia, psychiatric medication changes, the reasoning behind psychiatric medication changes, current progress, PT's understanding of his suitability to return home, and  ongoing coordination with the [place].  All questions answered exhaustively.  Review of Systems  Reason unable to perform ROS: Minmally responsive to questions, not always intelligible when he does respond.     Psychiatric and Social History  Psychiatric History:  Information collected from the patient  Prev Dx/Sx: Demential with behavioral disturbances. PTSD,Bipolar disorder.  Current Psych Provider: None Home Meds (current): Depakote  125 mg three times daily and Olanzapine  7.5 mg twice daily Previous Med Trials: as above Therapy: Unsure  Prior Psych Hospitalization: unsure  Prior Self Harm: unable to answer  Prior Violence: unable to answer   Family Psych History: unable to answer  Family Hx suicide: unable to answer  Social History:  Educational Hx:  unable to answer Occupational Hx:  unable to answer Legal Hx:  unable to answer Living Situation:  unable to answer Spiritual Hx:  unable to answer Access to weapons/lethal means:   unable to answer  Substance History Alcohol: previously per record   Type of alcohol beer Last Drink unsure Number of drinks per day unsure History of alcohol withdrawal seizures unsure History of DT's unsure Tobacco: unsure Illicit drugs: unsure 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 rate 18, SpO2 96%. There is no height or weight on file to calculate BMI.  Physical Exam Vitals reviewed.  Constitutional:      General: He is not in acute distress.    Mental Status Exam: General Appearance: Casual, in two point restraints, laying in bed, variably  attentive to interview.  On repeat interview, patient conversant, although repeating words and phrases consistent with diagnosis of advanced neurocognitive disorder.  Orientation: To self, unable to name hospital  Memory:  Grossly poor recently, patient did remember that he was in the [employer] when he was younger  Concentration:  Concentration: Poor and Attention Span: Poor  Recall:  Poor  Attention  Poor, attends conversation intermittently  Eye Contact:  Poor  Speech:  Garbled  Language:  Poor  Volume:  Normal  Mood: Could not respond to this question coherently  Affect:  Flat  Thought Process:  Descriptions of Associations: Tangential   Thought Content:  paucity of thought  Suicidal Thoughts:  Unable to assess  Homicidal Thoughts:  Unable to assess  Judgement:  Other:  unable to assess  Insight:  unable to assess  Psychomotor Activity:  Decreased  Akathisia:  No  Fund of Knowledge:  Poor      Assets:  Others:  unable to participate in sustained conversation   Cognition:  Impaired,  Severe  ADL's:  Impaired  AIMS (if indicated):        Other History   These have been pulled in through the EMR, reviewed, and updated if appropriate.  Family History:  The patient's  family history includes Diabetes in his father.  Medical History: Past Medical History:  Diagnosis Date   Chronic back pain greater than 3 months duration    Obesity (BMI 30.0-34.9)    Pneumonia due to COVID-19 virus 12/19/2018   Type 2 diabetes mellitus (HCC)     Surgical History: Past Surgical History:  Procedure Laterality Date   BACK SURGERY     COLONOSCOPY       Medications:   Current Facility-Administered Medications:    0.9 %  sodium chloride  infusion, , Intravenous, Continuous, Pahwani, Ravi, MD   acetaminophen  (TYLENOL ) tablet 650 mg, 650 mg, Oral, Q6H PRN **OR** acetaminophen  (TYLENOL ) suppository 650 mg, 650 mg, Rectal, Q6H PRN, Patel, Pranav M, MD   bisacodyl (DULCOLAX) suppository 10 mg,  10 mg, Rectal, Daily PRN, Patel, Pranav M, MD   cefTRIAXone  (ROCEPHIN ) 2 g in sodium chloride  0.9 % 100 mL IVPB, 2 g, Intravenous, Q24H, Tobie Yetta HERO, MD, Last Rate: 200 mL/hr at 10/23/23 1125, 2 g at 10/23/23 1125   divalproex  (DEPAKOTE  SPRINKLE) capsule 125 mg, 125 mg, Oral, Q12H, Patel, Pranav M, MD, 125 mg at 10/23/23 2149   escitalopram  (LEXAPRO ) tablet 20 mg, 20 mg, Oral, Daily, Patel, Pranav M, MD, 20 mg at 10/23/23 1124   gabapentin  (NEURONTIN ) capsule 300 mg, 300 mg, Oral, TID, Patel, Pranav M, MD, 300 mg at 10/23/23 2149   haloperidol  lactate (HALDOL ) injection 2 mg, 2 mg, Intramuscular, Q12H PRN, Rollene Katz, MD   heparin injection 5,000 Units, 5,000 Units, Subcutaneous, Q8H, Patel, Pranav M, MD, 5,000 Units at 10/24/23 9381   insulin  aspart (novoLOG ) injection 0-5 Units, 0-5 Units, Subcutaneous, QHS, Pahwani, Ravi, MD   insulin  aspart (novoLOG ) injection 0-6 Units, 0-6 Units, Subcutaneous, TID WC, Pahwani, Fredia, MD, 1 Units at 10/24/23 0620   methadone  (DOLOPHINE ) tablet 20 mg, 20 mg, Oral, Q12H, Patel, Pranav M, MD, 20 mg at 10/23/23 2149   ondansetron  (ZOFRAN ) tablet 4 mg, 4 mg, Oral, Q6H PRN **OR** ondansetron  (ZOFRAN ) injection 4 mg, 4 mg, Intravenous, Q6H PRN, Tobie Yetta HERO, MD   pantoprazole  (PROTONIX ) EC tablet 40 mg, 40 mg, Oral, BID AC, Patel, Pranav M, MD, 40 mg at 10/23/23 1743   polyethylene glycol (MIRALAX  / GLYCOLAX ) packet 17 g, 17 g, Oral, Daily, Patel, Pranav M, MD, 17 g at 10/23/23 1118   QUEtiapine  (SEROQUEL  XR) 24 hr tablet 100 mg, 100 mg, Oral, QHS, Rollene Katz, MD, 100 mg at 10/23/23 2149   QUEtiapine  (SEROQUEL ) tablet 12.5 mg, 12.5 mg, Oral, BID PRN, Rollene Katz, MD, 12.5 mg at 10/23/23 8147   tamsulosin  (FLOMAX ) capsule 0.4 mg, 0.4 mg, Oral, QHS, Patel, Pranav M, MD, 0.4 mg at 10/23/23 2149  Allergies: No Known Allergies  Nyimah Shadduck, MD

## 2023-10-25 ENCOUNTER — Encounter (HOSPITAL_COMMUNITY): Payer: Self-pay | Admitting: Hematology and Oncology

## 2023-10-25 DIAGNOSIS — G9341 Metabolic encephalopathy: Secondary | ICD-10-CM | POA: Diagnosis not present

## 2023-10-25 LAB — GLUCOSE, CAPILLARY
Glucose-Capillary: 147 mg/dL — ABNORMAL HIGH (ref 70–99)
Glucose-Capillary: 172 mg/dL — ABNORMAL HIGH (ref 70–99)
Glucose-Capillary: 209 mg/dL — ABNORMAL HIGH (ref 70–99)
Glucose-Capillary: 149 mg/dL — ABNORMAL HIGH (ref 70–99)

## 2023-10-25 MED ORDER — SENNOSIDES-DOCUSATE SODIUM 8.6-50 MG PO TABS
1.0000 | ORAL_TABLET | Freq: Every day | ORAL | Status: DC
  Administered 2023-10-25 – 2023-10-28 (×3): 1 via ORAL
  Filled 2023-10-25 (×3): qty 1

## 2023-10-25 NOTE — Progress Notes (Signed)
 Physical Therapy Treatment Patient Details Name: Lucas White MRN: [id number] DOB: 07-22-1960 Today's Date: 10/25/2023   History of Present Illness Pt is a 63 y.o. male presenting 10/17 with generalized weakness, difficulty ambulating, incontinence, poor oral intake x 1 week. Found to have elevated CK, CXR with mild diffuse interstitial opacities, possibly related to sepsis. MRI brain negative. EEG showed encephalopathy. PMH: DMII, bipolar disorder, dementia with behavioral disturbance/psychosis, , HLD, neuropathy, PTSD.    PT Comments  Pt asleep upon arrival with improved alertness once seated on EOB. Pt was unaware that he had a bowel movement with TotalA needed for pericare in standing. Initial MinA to stand with no AD with progression to CGA for safety and cueing. Once in standing, pt was fixated on needing sneakers with MinA needed to guide pt forwards into taking steps. Able to then ambulate 235ft with no AD and CGA +2 for safety. Continue to recommend home with HHPT. Acute PT to follow.    If plan is discharge home, recommend the following: Assist for transportation;Help with stairs or ramp for entrance;Direct supervision/assist for medications management;Direct supervision/assist for financial management;A Lucas help with bathing/dressing/bathroom;Assistance with cooking/housework   Can travel by private vehicle      Yes  Equipment Recommendations  None recommended by PT       Precautions / Restrictions Precautions Precautions: Fall Recall of Precautions/Restrictions: Impaired Restrictions Weight Bearing Restrictions Per Provider Order: No     Mobility  Bed Mobility Overal bed mobility: Needs Assistance Bed Mobility: Supine to Sit    Supine to sit: Mod assist    General bed mobility comments: ModA with increased assist due to initial lethargy. Improved alertness once seated on EOB    Transfers Overall transfer level: Needs assistance Equipment used: None Transfers:  Sit to/from Stand Sit to Stand: Contact guard assist, Min assist    General transfer comment: MinA for initial stand for slight steadying assist. CGA for follow-up stand for safety    Ambulation/Gait Ambulation/Gait assistance: Contact guard assist, +2 safety/equipment, Min assist Gait Distance (Feet): 250 Feet Assistive device: None Gait Pattern/deviations: Step-through pattern, Decreased stride length, Shuffle, Trunk flexed Gait velocity: decr     General Gait Details: Pt looks at the ground with constant cueing to look forwards and for upright posture. Able to slightly increased gait speed with cueing. Initial MinA to guide pt forwards into taking steps. CGA with +2 for safety     Balance Overall balance assessment: Needs assistance Sitting-balance support: No upper extremity supported, Feet supported Sitting balance-Leahy Scale: Good     Standing balance support: No upper extremity supported Standing balance-Leahy Scale: Fair       Hotel manager: Impaired Factors Affecting Communication: Difficulty expressing self  Cognition Arousal: Alert Behavior During Therapy: Flat affect   PT - Cognitive impairments: History of cognitive impairments, No family/caregiver present to determine baseline, Orientation, Awareness, Memory, Sequencing, Problem solving, Safety/Judgement, Initiation   Orientation impairments: Place, Time, Situation    PT - Cognition Comments: Hx of dementia, fixated on needing sneakers Following commands: Impaired Following commands impaired: Follows one step commands inconsistently, Follows one step commands with increased time    Cueing Cueing Techniques: Verbal cues, Gestural cues, Tactile cues         Pertinent Vitals/Pain Pain Assessment Pain Assessment: No/denies pain     PT Goals (current goals can now be found in the care plan section) Acute Rehab PT Goals PT Goal Formulation: With patient Time For Goal  Achievement: 11/03/23 Potential  to Achieve Goals: Good Progress towards PT goals: Progressing toward goals    Frequency    Min 1X/week       AM-PAC PT 6 Clicks Mobility   Outcome Measure  Help needed turning from your back to your side while in a flat bed without using bedrails?: None Help needed moving from lying on your back to sitting on the side of a flat bed without using bedrails?: A Lot Help needed moving to and from a bed to a chair (including a wheelchair)?: A Lucas Help needed standing up from a chair using your arms (e.g., wheelchair or bedside chair)?: A Lucas Help needed to walk in hospital room?: A Lucas Help needed climbing 3-5 steps with a railing? : A Lucas 6 Click Score: 18    End of Session Equipment Utilized During Treatment: Gait belt Activity Tolerance: Patient tolerated treatment well Patient left: Other (comment) (with OT at sink) Nurse Communication: Mobility status PT Visit Diagnosis: Other abnormalities of gait and mobility (R26.89)     Time: 9093-9075 PT Time Calculation (min) (ACUTE ONLY): 18 min  Charges:    $Gait Training: 8-22 mins PT General Charges $$ ACUTE PT VISIT: 1 Visit                    Lucas White, PT, DPT Secure Chat Preferred  Rehab Office [phone]   Lucas White 10/25/2023, 10:13 AM

## 2023-10-25 NOTE — Progress Notes (Signed)
 PROGRESS NOTE    Lucas White  FMW:[id number] DOB: 18-May-1960 DOA: 10/19/2023 PCP: [facility]   Brief Narrative:  The patient with PMH of type II DM, bipolar disorder, dementia with behavioral issues with psychosis, HLD, neuropathy, BPH, PTSD presented to [facility]/19/2023 with complaints of confusion, progressive generalized weakness, difficulty ambulating, poor p.o. intake, and fall. History was taken from wife and ED provider as the patient was unable to provide any history. Transferred to [facility] evening of 10/17 for weekend EEG.   Assessment & Plan:   Principal Problem:   Acute metabolic encephalopathy Active Problems:   Chronic pain syndrome   Type 2 diabetes mellitus with hyperglycemia, with long-term current use of insulin  (HCC)   Obesity (BMI 30.0-34.9)   Psychosis (HCC)   PTSD (post-traumatic stress disorder)   Aggressive behavior, adult   Dementia with behavioral disturbance (HCC)   Elevated CK   Pedal edema   Rigidity   Fall at home, initial encounter   Altered mental status  Acute metabolic encephalopathy/Psychosis / PTSD / Aggressive behavior, adult / Dementia with behavioral disturbance: Still ongoing, oriented only to self.  EEG shows encephalopathy, no sign of seizures or epileptiform discharges.  CT head and MRI brain unremarkable.  Metabolic workup thus far negative aside from elevated procalcitonin, MRI brain unremarkable as well. Psych on board, continue home Lexapro , methadone  and continue gabapentin  at reduced dose 300 mg 3 times daily.  Patient was initially started on home dose of Zyprexa , that did not help, he was transitioned to Seroquel  on 10/23/2023, psychiatry managing.  They are not concerned about NMS at this time.  Patient was found to have high rigidity in all extremities on 10/24/2023 raising concern for possible catatonia, he was given Ativan  challenge and rule out of catatonia.  Psychiatry increased Seroquel  dose.   Appreciate psychiatry managing this.   CAP CXR on admission showing mild diffuse interstitial opacities.  Patient received 5 days of Rocephin  and Zithromax , will stop for now.  Patient is asymptomatic.   Fall at home, initial encounter Ambulate with assistance.  Seen by PT OT, home health recommended.   Elevated CK / Rigidity: Does not have any significant rigidity on my exam today and CK only minimally elevated, does not meet criteria for NMS.  CK has been stable around 600.  Encouraged oral hydration but patient remains lethargic, doubt he is having enough oral hydration, will hydrate with normal saline 150 cc/h for next 10 hours today.  Repeat labs in the morning.   Pedal edema Acute in nature per wife.  Bilateral lower extremity ultrasound ordered.  Chronic pain syndrome  On methadone  20 mg twice daily.  On reduced dose of gabapentin .   Type 2 diabetes mellitus with uncontrolled with hyperglycemia, with long-term current use of insulin  Hemoglobin A1c 7.6. Home regimen includes metformin  1000, empagliflozin , sitagliptin, Lantus  20 units.  Currently only on SSI.  Blood sugar controlled.   Obesity (BMI 30.0-34.9) Placing the patient at high risk of poor outcome. ABG does not show any significant hypercarbia.  Will need counseling for weight loss and diet modification once he is able to comprehend.   GERD. Continue PPI.   BPH. Continue Flomax .  DVT prophylaxis: heparin injection 5,000 Units Start: 10/19/23 1515 TED hose Start: 10/19/23 1504   Code Status: Full Code  Family Communication:  None present at bedside.    Status is: Inpatient Remains inpatient appropriate because: Still encephalopathic.   Estimated body mass index is 27.67 kg/m  as calculated from the following:   Height as of 09/28/21: 5' 11 (1.803 m).   Weight as of 09/28/21: 90 kg.    Nutritional Assessment: There is no height or weight on file to calculate BMI.. Seen by dietician.  I agree with the  assessment and plan as outlined below: Nutrition Status:        . Skin Assessment: I have examined the patient's skin and I agree with the wound assessment as performed by the wound care RN as outlined below:    Consultants:  Psych   Procedures:  None none  Antimicrobials:  Anti-infectives (From admission, onward)    Start     Dose/Rate Route Frequency Ordered Stop   10/23/23 1000  azithromycin  (ZITHROMAX ) tablet 500 mg        500 mg Oral  Once 10/22/23 1133 10/23/23 1124   10/20/23 1000  cefTRIAXone  (ROCEPHIN ) 2 g in sodium chloride  0.9 % 100 mL IVPB        2 g 200 mL/hr over 30 Minutes Intravenous Every 24 hours 10/19/23 1529     10/20/23 1000  azithromycin  (ZITHROMAX ) 500 mg in sodium chloride  0.9 % 250 mL IVPB  Status:  Discontinued        500 mg 250 mL/hr over 60 Minutes Intravenous Every 24 hours 10/19/23 1529 10/22/23 1133   10/19/23 1400  cefTRIAXone  (ROCEPHIN ) 2 g in sodium chloride  0.9 % 100 mL IVPB        2 g 200 mL/hr over 30 Minutes Intravenous  Once 10/19/23 1356 10/19/23 1647   10/19/23 1400  azithromycin  (ZITHROMAX ) 500 mg in sodium chloride  0.9 % 250 mL IVPB        500 mg 250 mL/hr over 60 Minutes Intravenous  Once 10/19/23 1358 10/19/23 1647         Subjective: Seen and examined.  Alert and oriented to self as he has been for last several days.  Objective: Vitals:   10/24/23 1946 10/25/23 0059 10/25/23 0434 10/25/23 0754  BP: 115/78 93/81 95/73  93/67  Pulse: (!) 107 79 80 76  Resp: 18 18 18 19   Temp: 98.2 F (36.8 C) 98.9 F (37.2 C) 98.8 F (37.1 C) 97.9 F (36.6 C)  TempSrc: Oral Oral Oral Oral  SpO2: 100% 100% 99% 98%    Intake/Output Summary (Last 24 hours) at 10/25/2023 9175 Last data filed at 10/24/2023 1747 Gross per 24 hour  Intake --  Output 600 ml  Net -600 ml   There were no vitals filed for this visit.  Examination:  General exam: Appears calm and comfortable  Respiratory system: Clear to auscultation. Respiratory  effort normal. Cardiovascular system: S1 & S2 heard, RRR. No JVD, murmurs, rubs, gallops or clicks. No pedal edema. Gastrointestinal system: Abdomen is nondistended, soft and nontender. No organomegaly or masses felt. Normal bowel sounds heard. Central nervous system: Alert and oriented x 1.  Extremities: Symmetric 5 x 5 power. Skin: No rashes, lesions or ulcers  Data Reviewed: I have personally reviewed following labs and imaging studies  CBC: Recent Labs  Lab 10/19/23 0823 10/20/23 0519 10/21/23 0642  WBC 7.3 5.3 6.0  NEUTROABS 4.9 1.5*  --   HGB 11.6* 11.1* 11.3*  HCT 36.5* 33.6* 34.6*  MCV 89.7 87.5 88.5  PLT 400 349 371   Basic Metabolic Panel: Recent Labs  Lab 10/19/23 0823 10/20/23 0519 10/21/23 0642  NA 143 141 139  K 4.1 3.6 4.0  CL 103 103 102  CO2 30 27 25  GLUCOSE 103* 94 98  BUN 9 5* <5*  CREATININE 1.38* 1.06 1.16  CALCIUM 9.9 8.9 9.2  MG  --  1.4* 1.7   GFR: CrCl cannot be calculated (Unknown ideal weight.). Liver Function Tests: Recent Labs  Lab 10/19/23 0823 10/20/23 0519 10/21/23 0642  AST 28 23 21   ALT 8 11 13   ALKPHOS 90 75 78  BILITOT 0.3 0.8 0.6  PROT 7.7 6.5 7.1  ALBUMIN 4.3 3.3* 3.6   No results for input(s): LIPASE, AMYLASE in the last 168 hours. Recent Labs  Lab 10/19/23 1900  AMMONIA 62*   Coagulation Profile: Recent Labs  Lab 10/19/23 0823  INR 1.0   Cardiac Enzymes: Recent Labs  Lab 10/19/23 1317 10/20/23 0519 10/21/23 0642 10/22/23 1108 10/24/23 0500  CKTOTAL 803* 786* 556* 589* 459*   BNP (last 3 results) No results for input(s): PROBNP in the last 8760 hours. HbA1C: No results for input(s): HGBA1C in the last 72 hours.  CBG: Recent Labs  Lab 10/24/23 0611 10/24/23 1158 10/24/23 1736 10/24/23 2108 10/25/23 0601  GLUCAP 152* 174* 126* 194* 147*   Lipid Profile: No results for input(s): CHOL, HDL, LDLCALC, TRIG, CHOLHDL, LDLDIRECT in the last 72 hours. Thyroid  Function  Tests: No results for input(s): TSH, T4TOTAL, FREET4, T3FREE, THYROIDAB in the last 72 hours.  Anemia Panel: No results for input(s): VITAMINB12, FOLATE, FERRITIN, TIBC, IRON, RETICCTPCT in the last 72 hours.  Sepsis Labs: Recent Labs  Lab 10/19/23 9162 10/19/23 1116 10/19/23 1327 10/19/23 1900  PROCALCITON  --   --   --  >150.00  LATICACIDVEN 1.8 1.2 1.0  --     Recent Results (from the past 240 hours)  Blood Culture (routine x 2)     Status: Abnormal   Collection Time: 10/19/23  8:05 AM   Specimen: BLOOD  Result Value Ref Range Status   Specimen Description   Final    BLOOD LEFT ANTECUBITAL Performed at [facility], [address], [place], [place] [address]    Special Requests   Final    BOTTLES DRAWN AEROBIC AND ANAEROBIC Blood Culture adequate volume Performed at [facility], [address], [place], [place] [address]    Culture  Setup Time   Final    GRAM POSITIVE COCCI IN CLUSTERS AEROBIC BOTTLE ONLY CRITICAL RESULT CALLED TO, READ BACK BY AND VERIFIED WITH: PHARMD M.LAMB AT 1040 ON 10/23/2023 BY T.SAAD.    Culture (A)  Final    MICROCOCCUS SPECIES MICROCOCCUS LUTEUS Standardized susceptibility testing for this organism is not available. Performed at [facility], [address] [place], [place] [address]    Report Status 10/24/2023 FINAL  Final  Blood Culture ID Panel (Reflexed)     Status: None   Collection Time: 10/19/23  8:05 AM  Result Value Ref Range Status   Enterococcus faecalis NOT DETECTED NOT DETECTED Final   Enterococcus Faecium NOT DETECTED NOT DETECTED Final   Listeria monocytogenes NOT DETECTED NOT DETECTED Final   Staphylococcus species NOT DETECTED NOT DETECTED Final   Staphylococcus aureus (BCID) NOT DETECTED NOT DETECTED Final   Staphylococcus epidermidis NOT DETECTED NOT DETECTED Final   Staphylococcus lugdunensis NOT DETECTED NOT DETECTED Final   Streptococcus species  NOT DETECTED NOT DETECTED Final   Streptococcus agalactiae NOT DETECTED NOT DETECTED Final   Streptococcus pneumoniae NOT DETECTED NOT DETECTED Final   Streptococcus pyogenes NOT DETECTED NOT DETECTED Final   A.calcoaceticus-baumannii NOT DETECTED NOT DETECTED Final   Bacteroides  fragilis NOT DETECTED NOT DETECTED Final   Enterobacterales NOT DETECTED NOT DETECTED Final   Enterobacter cloacae complex NOT DETECTED NOT DETECTED Final   Escherichia coli NOT DETECTED NOT DETECTED Final   Klebsiella aerogenes NOT DETECTED NOT DETECTED Final   Klebsiella oxytoca NOT DETECTED NOT DETECTED Final   Klebsiella pneumoniae NOT DETECTED NOT DETECTED Final   Proteus species NOT DETECTED NOT DETECTED Final   Salmonella species NOT DETECTED NOT DETECTED Final   Serratia marcescens NOT DETECTED NOT DETECTED Final   Haemophilus influenzae NOT DETECTED NOT DETECTED Final   Neisseria meningitidis NOT DETECTED NOT DETECTED Final   Pseudomonas aeruginosa NOT DETECTED NOT DETECTED Final   Stenotrophomonas maltophilia NOT DETECTED NOT DETECTED Final   Candida albicans NOT DETECTED NOT DETECTED Final   Candida auris NOT DETECTED NOT DETECTED Final   Candida glabrata NOT DETECTED NOT DETECTED Final   Candida krusei NOT DETECTED NOT DETECTED Final   Candida parapsilosis NOT DETECTED NOT DETECTED Final   Candida tropicalis NOT DETECTED NOT DETECTED Final   Cryptococcus neoformans/gattii NOT DETECTED NOT DETECTED Final    Comment: Performed at [facility], [address], [place], [place] [address]  Blood Culture (routine x 2)     Status: None   Collection Time: 10/19/23  8:23 AM   Specimen: BLOOD  Result Value Ref Range Status   Specimen Description   Final    BLOOD RIGHT ANTECUBITAL Performed at [facility], [address], [place], [place] [address]    Special Requests   Final    BOTTLES DRAWN AEROBIC AND ANAEROBIC Blood Culture adequate volume Performed at [facility], [address] [place], [place] [address]    Culture   Final    NO GROWTH 5 DAYS Performed at [facility], [address] [place], [place] [address]    Report Status 10/24/2023 FINAL  Final  Resp panel by RT-PCR (RSV, Flu A&B, Covid) Anterior Nasal Swab     Status: None   Collection Time: 10/20/23  1:50 AM   Specimen: Anterior Nasal Swab  Result Value Ref Range Status   SARS Coronavirus 2 by RT PCR NEGATIVE NEGATIVE Final   Influenza A by PCR NEGATIVE NEGATIVE Final   Influenza B by PCR NEGATIVE NEGATIVE Final    Comment: (NOTE) The Xpert Xpress SARS-CoV-2/FLU/RSV plus assay is intended as an aid in the diagnosis of influenza from Nasopharyngeal swab specimens and should not be used as a sole basis for treatment. Nasal washings and aspirates are unacceptable for Xpert Xpress SARS-CoV-2/FLU/RSV testing.  Fact Sheet for Patients: BloggerCourse.com  Fact Sheet for Healthcare Providers: SeriousBroker.it  This test is not yet approved or cleared by the [place]  FDA and has been authorized for detection and/or diagnosis of SARS-CoV-2 by FDA under an Emergency Use Authorization (EUA). This EUA will remain in effect (meaning this test can be used) for the duration of the COVID-19 declaration under Section 564(b)(1) of the Act, 21 U.S.C. section 360bbb-3(b)(1), unless the authorization is terminated or revoked.     Resp Syncytial Virus by PCR NEGATIVE NEGATIVE Final    Comment: (NOTE) Fact Sheet for Patients: BloggerCourse.com  Fact Sheet for Healthcare Providers: SeriousBroker.it  This test is not yet approved or cleared by the [place]  FDA and has been authorized for detection and/or diagnosis of SARS-CoV-2 by FDA under an Emergency Use Authorization (EUA). This EUA will remain in effect (meaning this test can be used) for the duration  of  the COVID-19 declaration under Section 564(b)(1) of the Act, 21 U.S.C. section 360bbb-3(b)(1), unless the authorization is terminated or revoked.  Performed at [facility], [address] [place], [place] [address]   Respiratory (~20 pathogens) panel by PCR     Status: None   Collection Time: 10/20/23  1:50 AM   Specimen: Nasopharyngeal Swab; Respiratory  Result Value Ref Range Status   Adenovirus NOT DETECTED NOT DETECTED Final   Coronavirus 229E NOT DETECTED NOT DETECTED Final    Comment: (NOTE) The Coronavirus on the Respiratory Panel, DOES NOT test for the novel  Coronavirus (2019 nCoV)    Coronavirus HKU1 NOT DETECTED NOT DETECTED Final   Coronavirus NL63 NOT DETECTED NOT DETECTED Final   Coronavirus OC43 NOT DETECTED NOT DETECTED Final   Metapneumovirus NOT DETECTED NOT DETECTED Final   Rhinovirus / Enterovirus NOT DETECTED NOT DETECTED Final   Influenza A NOT DETECTED NOT DETECTED Final   Influenza B NOT DETECTED NOT DETECTED Final   Parainfluenza Virus 1 NOT DETECTED NOT DETECTED Final   Parainfluenza Virus 2 NOT DETECTED NOT DETECTED Final   Parainfluenza Virus 3 NOT DETECTED NOT DETECTED Final   Parainfluenza Virus 4 NOT DETECTED NOT DETECTED Final   Respiratory Syncytial Virus NOT DETECTED NOT DETECTED Final   Bordetella pertussis NOT DETECTED NOT DETECTED Final   Bordetella Parapertussis NOT DETECTED NOT DETECTED Final   Chlamydophila pneumoniae NOT DETECTED NOT DETECTED Final   Mycoplasma pneumoniae NOT DETECTED NOT DETECTED Final    Comment: Performed at [facility], [address], [place], [place] [address]     Radiology Studies: No results found.   Scheduled Meds:  divalproex   125 mg Oral Q12H   escitalopram   20 mg Oral Daily   feeding supplement  237 mL Oral BID BM   gabapentin   300 mg Oral TID   heparin  5,000 Units Subcutaneous Q8H   insulin  aspart  0-5 Units Subcutaneous QHS   insulin  aspart  0-6 Units Subcutaneous TID WC    methadone   20 mg Oral Q12H   pantoprazole   40 mg Oral BID AC   polyethylene glycol  17 g Oral Daily   QUEtiapine   150 mg Oral QHS   senna-docusate  2 tablet Oral QHS   tamsulosin   0.4 mg Oral QHS   Continuous Infusions:  cefTRIAXone  (ROCEPHIN )  IV 2 g (10/24/23 1129)     LOS: 5 days   Fredia Skeeter, MD [facility]  10/25/2023, 8:24 AM   *Please note that this is a verbal dictation therefore any spelling or grammatical errors are due to the Dragon Medical One system interpretation.  Please page via Amion and do not message via secure chat for urgent patient care matters. Secure chat can be used for non urgent patient care matters.  How to contact the [facility] Attending or Consulting provider 7A - 7P or covering provider during after hours 7P -7A, for this patient?  Check the care team in [facility] and look for a) attending/consulting [facility] provider listed and b) the [facility] team listed. Page or secure chat 7A-7P. Log into [email or web address] and use [facility]'s universal password to access. If you do not have the password, please contact the hospital operator. Locate the [facility] provider you are looking for under [facility] and page to a number that you can be directly reached. If you still have difficulty reaching the provider, please page the [facility] (Director on Call) for the Hospitalists listed on amion for  assistance.

## 2023-10-25 NOTE — Progress Notes (Signed)
 Occupational Therapy Treatment Patient Details Name: Lucas White MRN: [id number] DOB: [birth year] Today's Date: 10/25/2023   History of present illness Pt is a 63 y.o. male presenting 10/17 with generalized weakness, difficulty ambulating, incontinence, poor oral intake x 1 week. Found to have elevated CK, CXR with mild diffuse interstitial opacities, possibly related to sepsis. MRI brain negative. EEG showed encephalopathy. PMH: DMII, bipolar disorder, dementia with behavioral disturbance/psychosis, , HLD, neuropathy, PTSD.   OT comments  Patient with PT upon entry and assisted with ambulating in hallway for safety. Patient demonstrating good gains with UB dressing and grooming standing at sink. Patient able to stand for 15+ minutes at sink without seated rest break while performing self care tasks with cues for sequencing. Patient fatigue at end of session and required min assist to return to supine. Discharge recommendations continue to be appropriate for home with family assisting with IADLs and HHOT to follow.  Acute OT to continue to follow to address established goals.       If plan is discharge home, recommend the following:  A little help with walking and/or transfers;A little help with bathing/dressing/bathroom;Assistance with cooking/housework;Direct supervision/assist for medications management;Direct supervision/assist for financial management;Assist for transportation;Help with stairs or ramp for entrance;Supervision due to cognitive status   Equipment Recommendations  Tub/shower seat    Recommendations for Other Services      Precautions / Restrictions Precautions Precautions: Fall Recall of Precautions/Restrictions: Impaired Restrictions Weight Bearing Restrictions Per Provider Order: No       Mobility Bed Mobility Overal bed mobility: Needs Assistance Bed Mobility: Sit to Supine       Sit to supine: Min assist   General bed mobility comments: min assist to  return to supine due to increased lethargy at end of session    Transfers Overall transfer level: Needs assistance Equipment used: None Transfers: Sit to/from Stand Sit to Stand: Contact guard assist           General transfer comment: CGA and increased time     Balance Overall balance assessment: Needs assistance Sitting-balance support: No upper extremity supported, Feet supported Sitting balance-Leahy Scale: Good     Standing balance support: No upper extremity supported Standing balance-Leahy Scale: Fair Standing balance comment: able to stand at sink for 15+ minutes with supervision while performing self care tasks                           ADL either performed or assessed with clinical judgement   ADL Overall ADL's : Needs assistance/impaired     Grooming: Wash/dry hands;Wash/dry face;Oral care;Supervision/safety;Cueing for sequencing;Standing           Upper Body Dressing : Set up;Sitting Upper Body Dressing Details (indicate cue type and reason): gown for back Lower Body Dressing: Contact guard assist;Sit to/from stand                 General ADL Comments: patient requiring increased time to follow commands and cues for sequencing    Extremity/Trunk Assessment              Vision       Perception     Praxis     Communication Communication Communication: Impaired Factors Affecting Communication: Difficulty expressing self   Cognition Arousal: Alert Behavior During Therapy: Flat affect Cognition: History of cognitive impairments             OT - Cognition Comments: history of dementia  Following commands: Impaired Following commands impaired: Follows one step commands inconsistently, Follows one step commands with increased time      Cueing   Cueing Techniques: Verbal cues, Gestural cues, Tactile cues  Exercises      Shoulder Instructions       General Comments      Pertinent Vitals/  Pain       Pain Assessment Pain Assessment: No/denies pain  Home Living                                          Prior Functioning/Environment              Frequency  Min 2X/week        Progress Toward Goals  OT Goals(current goals can now be found in the care plan section)  Progress towards OT goals: Progressing toward goals  Acute Rehab OT Goals Patient Stated Goal: unable OT Goal Formulation: Patient unable to participate in goal setting Time For Goal Achievement: 11/03/23 Potential to Achieve Goals: Good ADL Goals Pt Will Perform Grooming: with modified independence;standing Pt Will Perform Lower Body Dressing: with modified independence;sit to/from stand Pt Will Transfer to Toilet: with modified independence;ambulating  Plan      Co-evaluation                 AM-PAC OT 6 Clicks Daily Activity     Outcome Measure   Help from another person eating meals?: None Help from another person taking care of personal grooming?: A Little Help from another person toileting, which includes using toliet, bedpan, or urinal?: A Little Help from another person bathing (including washing, rinsing, drying)?: A Little Help from another person to put on and taking off regular upper body clothing?: A Little Help from another person to put on and taking off regular lower body clothing?: None 6 Click Score: 20    End of Session Equipment Utilized During Treatment: Gait belt  OT Visit Diagnosis: Unsteadiness on feet (R26.81);Muscle weakness (generalized) (M62.81);Other symptoms and signs involving cognitive function   Activity Tolerance Patient tolerated treatment well   Patient Left in bed;with call bell/phone within reach;with bed alarm set;with restraints reapplied   Nurse Communication Mobility status        Time: 9080-9053 OT Time Calculation (min): 27 min  Charges: OT General Charges $OT Visit: 1 Visit OT Treatments $Self Care/Home  Management : 23-37 mins  Dick Laine, OTA Acute Rehabilitation Services  Office [phone]   Jeb LITTIE Laine 10/25/2023, 12:40 PM

## 2023-10-25 NOTE — Consult Note (Signed)
 [facility] Consult Follow-up  Patient Name: .Lucas White  MRN: [id number]  DOB: [birth year]  Consult Order details:  Orders (From admission, onward)     Start     Ordered   10/20/23 0826  IP CONSULT TO PSYCHIATRY       Ordering Provider: Macario Dorothyann HERO, MD  Provider:  (Not yet assigned)  Question Answer Comment  Location [facility]   Reason for Consult? dementia with bipolar, on seroquel  and zyprexa  for agitation. presnted with confusion. consult for medication management. concern for NMS witth incontinence, delirium, mild CK elevation and rigidity.      10/20/23 0825             Mode of Visit: In person    Psychiatry Consult Evaluation  Service Date: October 25, 2023 LOS:  LOS: 5 days  Chief Complaint I am in a place  Primary Psychiatric Diagnoses  Dementia with behavioral disturbance with psychosis  Assessment  Lucas White is a 63 y.o. male admitted: Medically on  10/19/2023  7:46 AM for confusion and fall. He carries the psychiatric diagnoses of bipolar disorder, PTSD, dementia with behavioral issues with psychosis and has a past medical history of HLD, neuropathy, BP and T2DM. Psychiatry consulted for medication management and concerns for NMS.   His current presentation of chronic confusion and disorientation, intermittent agitation, tangential thought process and paucity of taught content in the context of the underlying fronto-temporal Dementia is most consistent with Dementia with behavioral disturbance and psychosis. He does not meet criteria for inpatient psychiatric admission based on absent severe agitation, disruptive behavior, suicidal or homicidal ideation, intent or plan. Current outpatient psychotropic medications include Olanzapine  7.5 mg twice daily and Depakote  125 mg three times daily.Historically, he has had some response to these medications.  Patient is compliant with medications prior to admission as evidenced by  reports from his wife in previous documentation. On initial examination, patient is alert and awake but is confused and disoriented to time, place, person or situation.He is tangential with paucity of thought. Please see plan below for detailed recommendations.   10/21/2023 Patient seen face to face in his hospital room. He is awake, alert and oriented x 1. Patient understands that he's in the hospital and was able to name some familial objects like pen and wristwatch, which is an improvement from the day before. However, patient remains confused and disoriented to time, person and situation. He could not recognize this Clinical research associate despite talking to him yesterday and could not fully participate in coherent and meaningful conversation. When asked about his wife, patient states he has not heard from her for a while, but could not answer follow up questions as patient remains tangential with paucity of thought. He however, denies suicidal or homicidal ideation. Collateral information from the treating nurse indicates patient gets agitated intermittently but has neither been out of control nor require intramuscular medications for the past few days. The nurse states further that patient has been compliant with his scheduled medications with improved fluid intake with CK level improving from 786 yesterday to 556 today.    10/22/2023 Patient oriented to self and hospital but was unable to discuss much further. Kept falling asleep. I spoke with patient's wife, seems like he's worse than baseline, likely delirious (was unable to conduct a thorough interview). I think this is pretty clearly an acute encephalopathy on top of a pretty significant dementia picture. Wife notes worsening since they've moved. We're going to go  ahead and restart patient's home Olanzapine , which he uses for agitation at home. We'll start at 2.5 tonight with BID dosing going forward (he's been regularly getting PRN 2.5 mg at night for agitation  anyhow). Hopefully that helps with sleep-wake and agitation generally, will avoid further PRNs, and will keep him out of restraints. Very low concern for NMS. We will continue to follow.   10/23/2023 CK increased slightly 556 --> 589 yesterday. Continue oral hydration. Patient required additional IM olanzapine  2.5 mg x1 overnight 10/20 for agitation, totaling 7.5 mg with scheduled PO. While patient was more alert, he was still oriented only to self and could follow commands only intermittently. Believes it is 73. Could name glasses but not pen. When asked if he could state where he was, patient began slowly reading individual words from the fall precautions poster at foot of bed. Grasping for meaningful words but trails of incoherently: I guess you could find out where. I think its asper.SAB[place]art. Consistent with delirium. Given his neurologically tenuous state, the desire to avoid overly anti-cholinergic medication where possible, and wife's report of day-night disruption, we will transition from both PRN AND scheduled olanzapine  --> PRN AND scheduled quetiapine  for agitation. Wife amenable to this.   10/24/2023  Patient was evaluated for catatonia picture-after approximately 30 minutes after IV Ativan  2 mg x1, patient fell swiftly to sleep.  Negative Ativan  challenge.  Believe current presentation consistent with worsened dementia with overlying delirium secondary to metabolic causes.  Of note, patient's nurse Kathrine Chard notes that patient has not passed a bowel movement in 5 days, which may be contributing to his poor p.o. intake.  Will alert primary team.  Otherwise we will increase Seroquel  to 150 mg XL to be given at 6 PM -- patient required as needed Seroquel  at that time, hopefully this will reduce need for PRNs.  No further changes for now.  PT believes patient will be fit to go home with home health 11/1.  10/25/2023   No PRNs necessary over last 24 hours.  Patient appeared at  baseline on exam this a.m.  Psychiatry consult will sign off this time.  Diagnoses:  Active Hospital problems: Principal Problem:   Acute metabolic encephalopathy Active Problems:   Type 2 diabetes mellitus with hyperglycemia, with long-term current use of insulin  (HCC)   Obesity (BMI 30.0-34.9)   Chronic pain syndrome   Psychosis (HCC)   PTSD (post-traumatic stress disorder)   Aggressive behavior, adult   Dementia with behavioral disturbance (HCC)   Elevated CK   Pedal edema   Rigidity   Fall at home, initial encounter   Altered mental status    Plan   ## Psychiatric Medication Recommendations:  -- Continue seroquel  XR 150 mg at 6 p.m. scheduled for agitation and improved sleep-wake in setting of acute encepalopathy. -- Continue PRN seroquel  12.5 mg BID for agitation -- Continue IM Haldol  2 mg BID for refractory agitation, to be given only after seroquel  is given and deemed ineffective, document reasoning.    -- Continue Lexapro  20 mg daily for mood/behavioral disturbance -- Please discontinue Ativan  or other BZD due to risk of worsening confusion and disinhibition -- Please encourage hydration and monitor CK daily.  -- Continue other medical treatment as the primary team.  -- Will sign off at this time.  ## Medical Decision Making Capacity: Not specifically addressed in this encounter  ## Further Work-up:  --  TSH, B12, folate -- most recent EKG on 10/19/2023 had QtC of 432 --  Pertinent labwork reviewed earlier this admission includes: Mg-1.4, Albumin-3.3, CK-786    ## Disposition:-- There are no psychiatric contraindications to discharge at this time  ## Behavioral / Environmental: -Delirium Precautions: Delirium Interventions for Nursing and Staff: - RN to open blinds every AM. - To Bedside: Glasses, hearing aide, and pt's own shoes. Make available to patients. when possible and encourage use. - Encourage po fluids when appropriate, keep fluids within reach. - OOB to  chair with meals. - Passive ROM exercises to all extremities with AM & PM care. - RN to assess orientation to person, time and place QAM and PRN. - Recommend extended visitation hours with familiar family/friends as feasible. - Staff to minimize disturbances at night. Turn off television when pt asleep or when not in use.    ## Safety and Observation Level:  - Based on my clinical evaluation, I estimate the patient to be at low risk of self harm in the current setting. - At this time, we recommend  routine. This decision is based on my review of the chart including patient's history and current presentation, interview of the patient, mental status examination, and consideration of suicide risk including evaluating suicidal ideation, plan, intent, suicidal or self-harm behaviors, risk factors, and protective factors. This judgment is based on our ability to directly address suicide risk, implement suicide prevention strategies, and develop a safety plan while the patient is in the clinical setting. Please contact our team if there is a concern that risk level has changed.  CSSR Risk Category:C-SSRS RISK CATEGORY: No Risk  Suicide Risk Assessment: Patient has following modifiable risk factors for suicide: triggering events and pain, medical illness (ie new dx of cancer), which we are addressing by recommending medications and providing support. Patient has following non-modifiable or demographic risk factors for suicide: male gender Patient has the following protective factors against suicide: Supportive family and Cultural, spiritual, or religious beliefs that discourage suicide  Thank you for this consult request. Recommendations have been communicated to the primary team.  We will sign off at this time.    [place], MD       History of Present Illness  Relevant Aspects of Hospital Course:   Patient Report:   Patient appeared at baseline.  Only makes intermittent sense, not quite  oriented to conversation.  Appeared cheerful, alert and in no distress however.  Collateral information:   On conversation with spouse Lucas White [phone]):   10/20: Visited patient yesterday. Patient still not at baseline. Uses zyprexa  7.5 mg BID for defiance and agitation at home. Very combative at baseline.  No meds to treat his dementia, understands there is no treatment. Most of the time when you talk to him, you won't get a straight answer. Patient can sometimes be irritated at home, but he understands. Recently moved, however. More disoriented, behavioral dysregulation. Plan is to bring husband home.   10/21: Doesn't like his medicine adjusted, which is why they increased the Olanzapine . Something had to be changed in his medication. Sometimes stops and shakes his head when he gets confused, but wife can understand him. Lately, we're not clicking at all -- unable to make himself understood. More agitated at night when trying to go to bed -- wants to go out because he thinks. It's always night time. Sleeps during the day.   10/22: Discussed with both wife and mother the following topics: patients current condition, previous condition, current prognosis, the etiology of catatonia, the diagnostic workup of catatonia, psychiatric medication  changes, the reasoning behind psychiatric medication changes, current progress, PT's understanding of his suitability to return home, and ongoing coordination with the [place].  All questions answered exhaustively.  Review of Systems  Reason unable to perform ROS: Minmally responsive to questions, not always intelligible when he does respond.     Psychiatric and Social History  Psychiatric History:  Information collected from the patient  Prev Dx/Sx: Demential with behavioral disturbances. PTSD,Bipolar disorder.  Current Psych Provider: None Home Meds (current): Depakote  125 mg three times daily and Olanzapine  7.5 mg twice daily Previous Med Trials:  as above Therapy: Unsure  Prior Psych Hospitalization: unsure  Prior Self Harm: unable to answer  Prior Violence: unable to answer   Family Psych History: unable to answer  Family Hx suicide: unable to answer  Social History:  Educational Hx:  unable to answer Occupational Hx:  unable to answer Legal Hx:  unable to answer Living Situation:  unable to answer Spiritual Hx:  unable to answer Access to weapons/lethal means:   unable to answer  Substance History Alcohol: previously per record   Type of alcohol beer Last Drink unsure Number of drinks per day unsure History of alcohol withdrawal seizures unsure History of DT's unsure Tobacco: unsure Illicit drugs: unsure 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 rate 19, SpO2 98%. There is no height or weight on file to calculate BMI.  Physical Exam Vitals reviewed.  Constitutional:      General: He is not in acute distress.    Mental Status Exam: General Appearance: Casual, in two point restraints, laying in bed, attempted interview  Orientation: To self  Memory:  Grossly poor  Concentration:  Concentration: Poor and Attention Span: Poor  Recall:  Poor  Attention  Poor, attends conversation intermittently  Eye Contact:  Poor  Speech:  Garbled  Language:  Poor  Volume:  Normal  Mood: Could not respond to this question coherently  Affect:  Flat  Thought Process:  Descriptions of Associations: Tangential   Thought Content:  paucity of thought  Suicidal Thoughts:  Unable to assess  Homicidal Thoughts:  Unable to assess  Judgement:   Other:  unable to assess  Insight:  unable to assess  Psychomotor Activity:  Decreased  Akathisia:  No  Fund of Knowledge:  Poor      Assets:  Others:  unable to participate in sustained conversation   Cognition:  Impaired,  Severe  ADL's:  Impaired  AIMS (if indicated):        Other History   These have been pulled in through the EMR, reviewed, and updated if appropriate.  Family History:  The patient's family history includes Diabetes in his father.  Medical History: Past Medical History:  Diagnosis Date   Chronic back pain greater than 3 months duration    Obesity (BMI 30.0-34.9)    Pneumonia due to COVID-19 virus 12/19/2018   Type 2 diabetes mellitus (HCC)     Surgical History: Past Surgical History:  Procedure Laterality Date   BACK SURGERY     COLONOSCOPY       Medications:  Current Facility-Administered Medications:    acetaminophen  (TYLENOL ) tablet 650 mg, 650 mg, Oral, Q6H PRN **OR** acetaminophen  (TYLENOL ) suppository 650 mg, 650 mg, Rectal, Q6H PRN, Patel, Pranav M, MD   bisacodyl (DULCOLAX) suppository 10 mg, 10 mg, Rectal, Daily PRN, Patel, Pranav M, MD   divalproex  (DEPAKOTE  SPRINKLE) capsule 125 mg, 125 mg, Oral, Q12H, Patel, Pranav M, MD, 125 mg at 10/24/23 2147   escitalopram  (LEXAPRO ) tablet 20 mg, 20 mg, Oral, Daily, Patel, Pranav M, MD, 20 mg at 10/24/23 1123   feeding supplement (ENSURE PLUS HIGH PROTEIN) liquid 237 mL, 237 mL, Oral, BID BM, Rollene Katz, MD   gabapentin  (NEURONTIN ) capsule 300 mg, 300 mg, Oral, TID, Patel, Pranav M, MD, 300 mg at 10/24/23 2147   haloperidol  lactate (HALDOL ) injection 2 mg, 2 mg, Intramuscular, Q12H PRN, Rollene Katz, MD   heparin injection 5,000 Units, 5,000 Units, Subcutaneous, Q8H, Patel, Pranav M, MD, 5,000 Units at 10/25/23 9374   insulin  aspart (novoLOG ) injection 0-5 Units, 0-5 Units, Subcutaneous, QHS, Pahwani, Ravi, MD   insulin  aspart (novoLOG ) injection 0-6 Units, 0-6 Units, Subcutaneous, TID  WC, Pahwani, Ravi, MD, 1 Units at 10/24/23 1316   methadone  (DOLOPHINE ) tablet 20 mg, 20 mg, Oral, Q12H, Patel, Pranav M, MD, 20 mg at 10/24/23 2147   ondansetron  (ZOFRAN ) tablet 4 mg, 4 mg, Oral, Q6H PRN **OR** ondansetron  (ZOFRAN ) injection 4 mg, 4 mg, Intravenous, Q6H PRN, Patel, Pranav M, MD   pantoprazole  (PROTONIX ) EC tablet 40 mg, 40 mg, Oral, BID AC, Patel, Pranav M, MD, 40 mg at 10/24/23 1123   polyethylene glycol (MIRALAX  / GLYCOLAX ) packet 17 g, 17 g, Oral, Daily, Patel, Pranav M, MD, 17 g at 10/24/23 1124   QUEtiapine  (SEROQUEL  XR) 24 hr tablet 150 mg, 150 mg, Oral, QHS, Rollene Katz, MD, 150 mg at 10/24/23 2159   QUEtiapine  (SEROQUEL ) tablet 12.5 mg, 12.5 mg, Oral, BID PRN, Rollene Katz, MD, 12.5 mg at 10/23/23 1852   senna-docusate (Senokot-S) tablet 2 tablet, 2 tablet, Oral, QHS, Pahwani, Fredia, MD, 2 tablet at 10/24/23 2147   tamsulosin  (FLOMAX ) capsule 0.4 mg, 0.4 mg, Oral, QHS, Patel, Pranav M, MD, 0.4 mg at 10/24/23 2147  Allergies: No Known Allergies  Windle Huebert, MD

## 2023-10-26 DIAGNOSIS — G9341 Metabolic encephalopathy: Secondary | ICD-10-CM | POA: Diagnosis not present

## 2023-10-26 DIAGNOSIS — R4182 Altered mental status, unspecified: Secondary | ICD-10-CM | POA: Diagnosis not present

## 2023-10-26 DIAGNOSIS — J189 Pneumonia, unspecified organism: Secondary | ICD-10-CM | POA: Diagnosis not present

## 2023-10-26 LAB — GLUCOSE, CAPILLARY
Glucose-Capillary: 114 mg/dL — ABNORMAL HIGH (ref 70–99)
Glucose-Capillary: 183 mg/dL — ABNORMAL HIGH (ref 70–99)
Glucose-Capillary: 192 mg/dL — ABNORMAL HIGH (ref 70–99)
Glucose-Capillary: 143 mg/dL — ABNORMAL HIGH (ref 70–99)

## 2023-10-26 MED ORDER — HALOPERIDOL LACTATE 5 MG/ML IJ SOLN
2.0000 mg | Freq: Once | INTRAMUSCULAR | Status: AC
Start: 2023-10-26 — End: 2023-10-26
  Administered 2023-10-26: 2 mg via INTRAMUSCULAR

## 2023-10-26 NOTE — Plan of Care (Signed)
 Problem: Education: Goal: Ability to describe self-care measures that may prevent or decrease complications (Diabetes Survival Skills Education) will improve Outcome: Progressing Goal: Individualized Educational Video(s) Outcome: Progressing   Problem: Coping: Goal: Ability to adjust to condition or change in health will improve Outcome: Progressing   Problem: Fluid Volume: Goal: Ability to maintain a balanced intake and output will improve Outcome: Progressing   Problem: Health Behavior/Discharge Planning: Goal: Ability to identify and utilize available resources and services will improve Outcome: Progressing Goal: Ability to manage health-related needs will improve Outcome: Progressing   Problem: Metabolic: Goal: Ability to maintain appropriate glucose levels will improve Outcome: Progressing   Problem: Nutritional: Goal: Maintenance of adequate nutrition will improve Outcome: Progressing Goal: Progress toward achieving an optimal weight will improve Outcome: Progressing   Problem: Skin Integrity: Goal: Risk for impaired skin integrity will decrease Outcome: Progressing   Problem: Tissue Perfusion: Goal: Adequacy of tissue perfusion will improve Outcome: Progressing   Problem: Education: Goal: Knowledge of General Education information will improve Description: Including pain rating scale, medication(s)/side effects and non-pharmacologic comfort measures Outcome: Progressing   Problem: Health Behavior/Discharge Planning: Goal: Ability to manage health-related needs will improve Outcome: Progressing   Problem: Clinical Measurements: Goal: Ability to maintain clinical measurements within normal limits will improve Outcome: Progressing Goal: Will remain free from infection Outcome: Progressing Goal: Diagnostic test results will improve Outcome: Progressing Goal: Respiratory complications will improve Outcome: Progressing Goal: Cardiovascular complication will  be avoided Outcome: Progressing   Problem: Activity: Goal: Risk for activity intolerance will decrease Outcome: Progressing   Problem: Nutrition: Goal: Adequate nutrition will be maintained Outcome: Progressing   Problem: Coping: Goal: Level of anxiety will decrease Outcome: Progressing   Problem: Elimination: Goal: Will not experience complications related to bowel motility Outcome: Progressing Goal: Will not experience complications related to urinary retention Outcome: Progressing   Problem: Pain Managment: Goal: General experience of comfort will improve and/or be controlled Outcome: Progressing   Problem: Safety: Goal: Ability to remain free from injury will improve Outcome: Progressing   Problem: Skin Integrity: Goal: Risk for impaired skin integrity will decrease Outcome: Progressing   Problem: Safety: Goal: Non-violent Restraint(s) Outcome: Progressing   Problem: Education: Goal: Ability to describe self-care measures that may prevent or decrease complications (Diabetes Survival Skills Education) will improve Outcome: Progressing Goal: Individualized Educational Video(s) Outcome: Progressing   Problem: Coping: Goal: Ability to adjust to condition or change in health will improve Outcome: Progressing   Problem: Fluid Volume: Goal: Ability to maintain a balanced intake and output will improve Outcome: Progressing   Problem: Health Behavior/Discharge Planning: Goal: Ability to identify and utilize available resources and services will improve Outcome: Progressing Goal: Ability to manage health-related needs will improve Outcome: Progressing   Problem: Metabolic: Goal: Ability to maintain appropriate glucose levels will improve Outcome: Progressing   Problem: Nutritional: Goal: Maintenance of adequate nutrition will improve Outcome: Progressing Goal: Progress toward achieving an optimal weight will improve Outcome: Progressing   Problem: Skin  Integrity: Goal: Risk for impaired skin integrity will decrease Outcome: Progressing   Problem: Tissue Perfusion: Goal: Adequacy of tissue perfusion will improve Outcome: Progressing   Problem: Education: Goal: Knowledge of General Education information will improve Description: Including pain rating scale, medication(s)/side effects and non-pharmacologic comfort measures Outcome: Progressing   Problem: Health Behavior/Discharge Planning: Goal: Ability to manage health-related needs will improve Outcome: Progressing   Problem: Clinical Measurements: Goal: Ability to maintain clinical measurements within normal limits will improve Outcome: Progressing Goal: Will remain free  from infection Outcome: Progressing Goal: Diagnostic test results will improve Outcome: Progressing Goal: Respiratory complications will improve Outcome: Progressing Goal: Cardiovascular complication will be avoided Outcome: Progressing   Problem: Activity: Goal: Risk for activity intolerance will decrease Outcome: Progressing   Problem: Nutrition: Goal: Adequate nutrition will be maintained Outcome: Progressing   Problem: Coping: Goal: Level of anxiety will decrease Outcome: Progressing   Problem: Elimination: Goal: Will not experience complications related to bowel motility Outcome: Progressing Goal: Will not experience complications related to urinary retention Outcome: Progressing   Problem: Pain Managment: Goal: General experience of comfort will improve and/or be controlled Outcome: Progressing   Problem: Safety: Goal: Ability to remain free from injury will improve Outcome: Progressing   Problem: Skin Integrity: Goal: Risk for impaired skin integrity will decrease Outcome: Progressing   Problem: Safety: Goal: Non-violent Restraint(s) Outcome: Progressing

## 2023-10-26 NOTE — Progress Notes (Signed)
 PROGRESS NOTE    Lucas White  FMW:[id number] DOB: [birth year] DOA: 10/19/2023 PCP: [facility]   Brief Narrative:  The patient with PMH of type II DM, bipolar disorder, dementia with behavioral issues with psychosis, HLD, neuropathy, BPH, PTSD presented to [facility] 10/19/2023 with complaints of confusion, progressive generalized weakness, difficulty ambulating, poor p.o. intake, and fall. History was taken from wife and ED provider as the patient was unable to provide any history. Transferred to [facility] evening of 10/17 for weekend EEG.   Assessment & Plan:   Principal Problem:   Acute metabolic encephalopathy Active Problems:   Chronic pain syndrome   Type 2 diabetes mellitus with hyperglycemia, with long-term current use of insulin  (HCC)   Obesity (BMI 30.0-34.9)   PTSD (post-traumatic stress disorder)   Dementia with behavioral disturbance (HCC)   Elevated CK   Pedal edema   Fall at home, initial encounter   Altered mental status  Acute metabolic encephalopathy/Psychosis / PTSD / Aggressive behavior, adult / Dementia with behavioral disturbance: Still ongoing, oriented only to self. EEG shows encephalopathy, no sign of seizures or epileptiform discharges. CT head and MRI brain unremarkable. Metabolic workup thus far negative aside from elevated procalcitonin, MRI brain unremarkable as well. Psych on board, continue home Lexapro , methadone  and continue gabapentin  at reduced dose 300 mg 3 times daily. Patient was initially started on home dose of Zyprexa , that did not help, he was transition to Seroquel  on 10/23/2023, psychiatry managing. They are not concerned about NMS at this time. Patient was found to have high rigidity in all extremities.  This raises concern for possible catatonia. Ativan  challenge was performed that was negative and catatonia was ruled out. Presentation thought to be due to his worsening dementia with delirium. Psych signed off on  10/23, avoid benzodiazepine. Delirium precautions!    CAP CXR on admission showing mild diffuse interstitial opacities. Completed 5 days of rocephin  and Zithromax . Patient asymptomatic.   Fall at home, initial encounter Ambulate with assistance. Seen by PT OT, home health recommended.   Elevated CK / Rigidity: Improved. Does not have any significant rigidity on my exam and CK only minimally elevated, does not meet criteria for NMS. CK trended down to 459. Encourage oral hydration.    Pedal edema Acute in nature per wife. Bilateral lower extremity U/S negative for DVT.  Chronic pain syndrome  On methadone  20 mg twice daily. On reduced dose of gabapentin .   Type 2 diabetes mellitus with uncontrolled with hyperglycemia, with long-term current use of insulin  Hemoglobin A1c 7.6%. Home regimen includes metformin  1000, empagliflozin , sitagliptin, Lantus  20 units. Currently only on SSI. Blood sugar controlled.   Obesity (BMI 30.0-34.9) Placing the patient at high risk of poor outcome. ABG does not show any significant hypercarbia. Will need counseling for weight loss and diet modification once he is able to comprehend.   GERD. Continue PPI.   BPH. Continue Flomax .  DVT prophylaxis: heparin injection 5,000 Units Start: 10/19/23 1515 TED hose Start: 10/19/23 1504   Code Status: Full Code  Family Communication:  Spoke with spouse over the phone.  Status is: Inpatient Remains inpatient appropriate because: Still delirious   Estimated body mass index is 27.67 kg/m as calculated from the following:   Height as of 09/28/21: 5' 11 (1.803 m).   Weight as of 09/28/21: 90 kg.    Nutritional Assessment: There is no height or weight on file to calculate BMI.. Seen by dietician. I agree with the assessment and  plan as outlined below: Nutrition Status:        Skin Assessment: I have examined the patient's skin and I agree with the wound assessment as performed by the wound care RN as  outlined below:    Consultants:  Psych   Procedures:  None none  Antimicrobials:  Anti-infectives (From admission, onward)    Start     Dose/Rate Route Frequency Ordered Stop   10/23/23 1000  azithromycin  (ZITHROMAX ) tablet 500 mg        500 mg Oral  Once 10/22/23 1133 10/23/23 1124   10/20/23 1000  cefTRIAXone  (ROCEPHIN ) 2 g in sodium chloride  0.9 % 100 mL IVPB  Status:  Discontinued        2 g 200 mL/hr over 30 Minutes Intravenous Every 24 hours 10/19/23 1529 10/25/23 0826   10/20/23 1000  azithromycin  (ZITHROMAX ) 500 mg in sodium chloride  0.9 % 250 mL IVPB  Status:  Discontinued        500 mg 250 mL/hr over 60 Minutes Intravenous Every 24 hours 10/19/23 1529 10/22/23 1133   10/19/23 1400  cefTRIAXone  (ROCEPHIN ) 2 g in sodium chloride  0.9 % 100 mL IVPB        2 g 200 mL/hr over 30 Minutes Intravenous  Once 10/19/23 1356 10/19/23 1647   10/19/23 1400  azithromycin  (ZITHROMAX ) 500 mg in sodium chloride  0.9 % 250 mL IVPB        500 mg 250 mL/hr over 60 Minutes Intravenous  Once 10/19/23 1358 10/19/23 1647         Subjective: Patient was examined after being changed by techs. He reports feeling good but keeps repeating this. Unable to answer any other questions.   Objective: Vitals:   10/25/23 2023 10/25/23 2347 10/26/23 0414 10/26/23 0810  BP: 113/70 99/67 94/66  113/74  Pulse: (!) 104 82 73 77  Resp: 16 18 16 16   Temp: 99.3 F (37.4 C) 99.1 F (37.3 C) 98.2 F (36.8 C) 98.2 F (36.8 C)  TempSrc: Oral  Oral Oral  SpO2: 93%  97% 95%    Intake/Output Summary (Last 24 hours) at 10/26/2023 1446 Last data filed at 10/26/2023 0930 Gross per 24 hour  Intake 720 ml  Output 650 ml  Net 70 ml   There were no vitals filed for this visit.  Examination:  General: Confused elderly man laying in bed. No distress.  CV: RRR. No murmurs, rubs, or gallops. No LE edema Pulmonary: Lungs CTAB. Normal effort. No wheezing or rales. Abdominal: Soft, nontender, nondistended.  Normal bowel sounds. Extremities: Palpable radial and DP pulses. Normal ROM. Skin: Warm and dry. No obvious rash or lesions. Neuro: Alert and oriented to self only. Continues to repeat himself. Normal sensation to light touch.  Data Reviewed: I have personally reviewed following labs and imaging studies  CBC: Recent Labs  Lab 10/19/23 0823 10/20/23 0519 10/21/23 0642  WBC 7.3 5.3 6.0  NEUTROABS 4.9 1.5*  --   HGB 11.6* 11.1* 11.3*  HCT 36.5* 33.6* 34.6*  MCV 89.7 87.5 88.5  PLT 400 349 371   Basic Metabolic Panel: Recent Labs  Lab 10/19/23 0823 10/20/23 0519 10/21/23 0642  NA 143 141 139  K 4.1 3.6 4.0  CL 103 103 102  CO2 30 27 25   GLUCOSE 103* 94 98  BUN 9 5* <5*  CREATININE 1.38* 1.06 1.16  CALCIUM 9.9 8.9 9.2  MG  --  1.4* 1.7   GFR: CrCl cannot be calculated (Unknown ideal weight.). Liver Function Tests:  Recent Labs  Lab 10/19/23 0823 10/20/23 0519 10/21/23 0642  AST 28 23 21   ALT 8 11 13   ALKPHOS 90 75 78  BILITOT 0.3 0.8 0.6  PROT 7.7 6.5 7.1  ALBUMIN 4.3 3.3* 3.6   No results for input(s): LIPASE, AMYLASE in the last 168 hours. Recent Labs  Lab 10/19/23 1900  AMMONIA 62*   Coagulation Profile: Recent Labs  Lab 10/19/23 0823  INR 1.0   Cardiac Enzymes: Recent Labs  Lab 10/19/23 1317 10/20/23 0519 10/21/23 0642 10/22/23 1108 10/24/23 0500  CKTOTAL 803* 786* 556* 589* 459*   BNP (last 3 results) No results for input(s): PROBNP in the last 8760 hours. HbA1C: No results for input(s): HGBA1C in the last 72 hours.  CBG: Recent Labs  Lab 10/25/23 0601 10/25/23 1122 10/25/23 1628 10/25/23 2107 10/26/23 0616  GLUCAP 147* 172* 209* 149* 114*   Lipid Profile: No results for input(s): CHOL, HDL, LDLCALC, TRIG, CHOLHDL, LDLDIRECT in the last 72 hours. Thyroid  Function Tests: No results for input(s): TSH, T4TOTAL, FREET4, T3FREE, THYROIDAB in the last 72 hours.  Anemia Panel: No results for input(s):  VITAMINB12, FOLATE, FERRITIN, TIBC, IRON, RETICCTPCT in the last 72 hours.  Sepsis Labs: Recent Labs  Lab 10/19/23 9162 10/19/23 1116 10/19/23 1327 10/19/23 1900  PROCALCITON  --   --   --  >150.00  LATICACIDVEN 1.8 1.2 1.0  --     Recent Results (from the past 240 hours)  Blood Culture (routine x 2)     Status: Abnormal   Collection Time: 10/19/23  8:05 AM   Specimen: BLOOD  Result Value Ref Range Status   Specimen Description   Final    BLOOD LEFT ANTECUBITAL Performed at [facility], [address], [place], [place] [address]    Special Requests   Final    BOTTLES DRAWN AEROBIC AND ANAEROBIC Blood Culture adequate volume Performed at [facility], [address], [place], [place] [address]    Culture  Setup Time   Final    GRAM POSITIVE COCCI IN CLUSTERS AEROBIC BOTTLE ONLY CRITICAL RESULT CALLED TO, READ BACK BY AND VERIFIED WITH: PHARMD M.LAMB AT 1040 ON 10/23/2023 BY T.SAAD.    Culture (A)  Final    MICROCOCCUS SPECIES MICROCOCCUS LUTEUS Standardized susceptibility testing for this organism is not available. Performed at [facility], [address], [place], [place] [address]    Report Status 10/24/2023 FINAL  Final  Blood Culture ID Panel (Reflexed)     Status: None   Collection Time: 10/19/23  8:05 AM  Result Value Ref Range Status   Enterococcus faecalis NOT DETECTED NOT DETECTED Final   Enterococcus Faecium NOT DETECTED NOT DETECTED Final   Listeria monocytogenes NOT DETECTED NOT DETECTED Final   Staphylococcus species NOT DETECTED NOT DETECTED Final   Staphylococcus aureus (BCID) NOT DETECTED NOT DETECTED Final   Staphylococcus epidermidis NOT DETECTED NOT DETECTED Final   Staphylococcus lugdunensis NOT DETECTED NOT DETECTED Final   Streptococcus species NOT DETECTED NOT DETECTED Final   Streptococcus agalactiae NOT DETECTED NOT DETECTED Final   Streptococcus pneumoniae NOT DETECTED NOT DETECTED  Final   Streptococcus pyogenes NOT DETECTED NOT DETECTED Final   A.calcoaceticus-baumannii NOT DETECTED NOT DETECTED Final   Bacteroides fragilis NOT DETECTED NOT DETECTED Final   Enterobacterales NOT DETECTED NOT DETECTED Final   Enterobacter cloacae complex NOT DETECTED NOT DETECTED Final   Escherichia coli NOT DETECTED NOT DETECTED Final   Klebsiella aerogenes NOT DETECTED  NOT DETECTED Final   Klebsiella oxytoca NOT DETECTED NOT DETECTED Final   Klebsiella pneumoniae NOT DETECTED NOT DETECTED Final   Proteus species NOT DETECTED NOT DETECTED Final   Salmonella species NOT DETECTED NOT DETECTED Final   Serratia marcescens NOT DETECTED NOT DETECTED Final   Haemophilus influenzae NOT DETECTED NOT DETECTED Final   Neisseria meningitidis NOT DETECTED NOT DETECTED Final   Pseudomonas aeruginosa NOT DETECTED NOT DETECTED Final   Stenotrophomonas maltophilia NOT DETECTED NOT DETECTED Final   Candida albicans NOT DETECTED NOT DETECTED Final   Candida auris NOT DETECTED NOT DETECTED Final   Candida glabrata NOT DETECTED NOT DETECTED Final   Candida krusei NOT DETECTED NOT DETECTED Final   Candida parapsilosis NOT DETECTED NOT DETECTED Final   Candida tropicalis NOT DETECTED NOT DETECTED Final   Cryptococcus neoformans/gattii NOT DETECTED NOT DETECTED Final    Comment: Performed at [facility], [address] [place], [place] [address]  Blood Culture (routine x 2)     Status: None   Collection Time: 10/19/23  8:23 AM   Specimen: BLOOD  Result Value Ref Range Status   Specimen Description   Final    BLOOD RIGHT ANTECUBITAL Performed at [facility], [address], [place], [place] [address]    Special Requests   Final    BOTTLES DRAWN AEROBIC AND ANAEROBIC Blood Culture adequate volume Performed at [facility], [address], [place], [place] [address]    Culture   Final    NO GROWTH 5 DAYS Performed at [facility], [address], [place], [place] [address]    Report Status 10/24/2023 FINAL  Final  Resp panel by RT-PCR (RSV, Flu A&B, Covid) Anterior Nasal Swab     Status: None   Collection Time: 10/20/23  1:50 AM   Specimen: Anterior Nasal Swab  Result Value Ref Range Status   SARS Coronavirus 2 by RT PCR NEGATIVE NEGATIVE Final   Influenza A by PCR NEGATIVE NEGATIVE Final   Influenza B by PCR NEGATIVE NEGATIVE Final    Comment: (NOTE) The Xpert Xpress SARS-CoV-2/FLU/RSV plus assay is intended as an aid in the diagnosis of influenza from Nasopharyngeal swab specimens and should not be used as a sole basis for treatment. Nasal washings and aspirates are unacceptable for Xpert Xpress SARS-CoV-2/FLU/RSV testing.  Fact Sheet for Patients: BloggerCourse.com  Fact Sheet for Healthcare Providers: SeriousBroker.it  This test is not yet approved or cleared by the [place]  FDA and has been authorized for detection and/or diagnosis of SARS-CoV-2 by FDA under an Emergency Use Authorization (EUA). This EUA will remain in effect (meaning this test can be used) for the duration of the COVID-19 declaration under Section 564(b)(1) of the Act, 21 U.S.C. section 360bbb-3(b)(1), unless the authorization is terminated or revoked.     Resp Syncytial Virus by PCR NEGATIVE NEGATIVE Final    Comment: (NOTE) Fact Sheet for Patients: BloggerCourse.com  Fact Sheet for Healthcare Providers: SeriousBroker.it  This test is not yet approved or cleared by the [place]  FDA and has been authorized for detection and/or diagnosis of SARS-CoV-2 by FDA under an Emergency Use Authorization (EUA). This EUA will remain in effect (meaning this test can be used) for the duration of the COVID-19 declaration under Section 564(b)(1) of the Act, 21 U.S.C. section 360bbb-3(b)(1), unless the authorization is terminated  or revoked.  Performed at [facility], [address] [place], [place] [address]   Respiratory (~  20 pathogens) panel by PCR     Status: None   Collection Time: 10/20/23  1:50 AM   Specimen: Nasopharyngeal Swab; Respiratory  Result Value Ref Range Status   Adenovirus NOT DETECTED NOT DETECTED Final   Coronavirus 229E NOT DETECTED NOT DETECTED Final    Comment: (NOTE) The Coronavirus on the Respiratory Panel, DOES NOT test for the novel  Coronavirus (2019 nCoV)    Coronavirus HKU1 NOT DETECTED NOT DETECTED Final   Coronavirus NL63 NOT DETECTED NOT DETECTED Final   Coronavirus OC43 NOT DETECTED NOT DETECTED Final   Metapneumovirus NOT DETECTED NOT DETECTED Final   Rhinovirus / Enterovirus NOT DETECTED NOT DETECTED Final   Influenza A NOT DETECTED NOT DETECTED Final   Influenza B NOT DETECTED NOT DETECTED Final   Parainfluenza Virus 1 NOT DETECTED NOT DETECTED Final   Parainfluenza Virus 2 NOT DETECTED NOT DETECTED Final   Parainfluenza Virus 3 NOT DETECTED NOT DETECTED Final   Parainfluenza Virus 4 NOT DETECTED NOT DETECTED Final   Respiratory Syncytial Virus NOT DETECTED NOT DETECTED Final   Bordetella pertussis NOT DETECTED NOT DETECTED Final   Bordetella Parapertussis NOT DETECTED NOT DETECTED Final   Chlamydophila pneumoniae NOT DETECTED NOT DETECTED Final   Mycoplasma pneumoniae NOT DETECTED NOT DETECTED Final    Comment: Performed at [facility], [address] [place], [place] [address]     Radiology Studies: No results found.   Scheduled Meds:  divalproex   125 mg Oral Q12H   escitalopram   20 mg Oral Daily   feeding supplement  237 mL Oral BID BM   gabapentin   300 mg Oral TID   heparin  5,000 Units Subcutaneous Q8H   insulin  aspart  0-5 Units Subcutaneous QHS   insulin  aspart  0-6 Units Subcutaneous TID WC   methadone   20 mg Oral Q12H   pantoprazole   40 mg Oral BID AC   polyethylene glycol  17 g Oral Daily   QUEtiapine   150 mg Oral QHS    senna-docusate  1 tablet Oral QHS   tamsulosin   0.4 mg Oral QHS   Continuous Infusions:     LOS: 6 days   Claretta CHRISTELLA Alderman, MD [facility]  10/26/2023, 8:12 AM   *Please note that this is a verbal dictation therefore any spelling or grammatical errors are due to the Dragon Medical One system interpretation.  Please page via Amion and do not message via secure chat for urgent patient care matters. Secure chat can be used for non urgent patient care matters.  How to contact the [facility] Attending or Consulting provider 7A - 7P or covering provider during after hours 7P -7A, for this patient?  Check the care team in [facility] and look for a) attending/consulting [facility] provider listed and b) the [facility] team listed. Page or secure chat 7A-7P. Log into [email or web address] and use [place]'s universal password to access. If you do not have the password, please contact the hospital operator. Locate the [facility] provider you are looking for under [facility] and page to a number that you can be directly reached. If you still have difficulty reaching the provider, please page the [facility] (Director on Call) for the Hospitalists listed on amion for assistance.

## 2023-10-27 DIAGNOSIS — G9341 Metabolic encephalopathy: Secondary | ICD-10-CM | POA: Diagnosis not present

## 2023-10-27 DIAGNOSIS — F03918 Unspecified dementia, unspecified severity, with other behavioral disturbance: Secondary | ICD-10-CM | POA: Diagnosis not present

## 2023-10-27 LAB — GLUCOSE, CAPILLARY
Glucose-Capillary: 180 mg/dL — ABNORMAL HIGH (ref 70–99)
Glucose-Capillary: 93 mg/dL (ref 70–99)
Glucose-Capillary: 231 mg/dL — ABNORMAL HIGH (ref 70–99)
Glucose-Capillary: 140 mg/dL — ABNORMAL HIGH (ref 70–99)

## 2023-10-27 NOTE — Plan of Care (Signed)

## 2023-10-27 NOTE — Plan of Care (Signed)

## 2023-10-27 NOTE — Progress Notes (Signed)
 PROGRESS NOTE    Lucas White  FMW:[id number] DOB: 1960-05-17 DOA: 10/19/2023 PCP: [facility]   Brief Narrative:  The patient with PMH of type II DM, bipolar disorder, dementia with behavioral issues with psychosis, HLD, neuropathy, BPH, PTSD presented to [facility] 10/19/2023 with complaints of confusion, progressive generalized weakness, difficulty ambulating, poor p.o. intake, and fall. History was taken from wife and ED provider as the patient was unable to provide any history. Transferred to [facility] evening of 10/17 for weekend EEG.   Assessment & Plan:   Principal Problem:   Acute metabolic encephalopathy Active Problems:   Chronic pain syndrome   Type 2 diabetes mellitus with hyperglycemia, with long-term current use of insulin  (HCC)   Obesity (BMI 30.0-34.9)   PTSD (post-traumatic stress disorder)   Dementia with behavioral disturbance (HCC)   Elevated CK   Pedal edema   Fall at home, initial encounter   Altered mental status   Pneumonia of both lungs due to infectious organism  Acute metabolic encephalopathy/Psychosis / PTSD / Aggressive behavior, adult / Dementia with behavioral disturbance: Still ongoing, oriented only to self. EEG shows encephalopathy, no sign of seizures or epileptiform discharges. CT head and MRI brain unremarkable. Metabolic workup thus far negative aside from elevated procalcitonin, MRI brain unremarkable as well. Psych on board, continue home Lexapro , methadone  and continue gabapentin  at reduced dose 300 mg 3 times daily. Patient was initially started on home dose of Zyprexa , that did not help, he was transition to Seroquel  on 10/23/2023, psychiatry managing. They are not concerned about NMS at this time. Patient was found to have high rigidity in all extremities.  This raises concern for possible catatonia. Ativan  challenge was performed that was negative and catatonia was ruled out. Presentation thought to be due to his  worsening dementia with delirium. Psych signed off on 10/23, avoid benzodiazepine. Delirium precautions!    CAP CXR on admission showing mild diffuse interstitial opacities. Completed 5 days of rocephin  and Zithromax . Patient asymptomatic.   Fall at home, initial encounter Ambulate with assistance. Seen by PT OT, home health recommended.   Elevated CK / Rigidity: Improved. Does not have any significant rigidity on my exam and CK only minimally elevated, does not meet criteria for NMS. CK trended down to 459. Encourage oral hydration.    Pedal edema Acute in nature per wife. Bilateral lower extremity U/S negative for DVT.  Chronic pain syndrome  On methadone  20 mg twice daily. On reduced dose of gabapentin .   Type 2 diabetes mellitus with uncontrolled with hyperglycemia, with long-term current use of insulin  Hemoglobin A1c 7.6%. Home regimen includes metformin  1000, empagliflozin , sitagliptin, Lantus  20 units. Currently only on SSI. Blood sugar controlled.   Obesity (BMI 30.0-34.9) Placing the patient at high risk of poor outcome. ABG does not show any significant hypercarbia. Will need counseling for weight loss and diet modification once he is able to comprehend.   GERD. Continue PPI.   BPH. Continue Flomax .  DVT prophylaxis: heparin injection 5,000 Units Start: 10/19/23 1515 TED hose Start: 10/19/23 1504   Code Status: Full Code  Family Communication: No family at bedside.  Status is: Inpatient Remains inpatient appropriate because: Still delirious   Estimated body mass index is 27.67 kg/m as calculated from the following:   Height as of 09/28/21: 5' 11 (1.803 m).   Weight as of 09/28/21: 90 kg.    Nutritional Assessment: There is no height or weight on file to calculate BMI.. Seen by  dietician. I agree with the assessment and plan as outlined below: Nutrition Status:        Skin Assessment: I have examined the patient's skin and I agree with the wound  assessment as performed by the wound care RN as outlined below:    Consultants:  Psych   Procedures:  None none  Antimicrobials:  Anti-infectives (From admission, onward)    Start     Dose/Rate Route Frequency Ordered Stop   10/23/23 1000  azithromycin  (ZITHROMAX ) tablet 500 mg        500 mg Oral  Once 10/22/23 1133 10/23/23 1124   10/20/23 1000  cefTRIAXone  (ROCEPHIN ) 2 g in sodium chloride  0.9 % 100 mL IVPB  Status:  Discontinued        2 g 200 mL/hr over 30 Minutes Intravenous Every 24 hours 10/19/23 1529 10/25/23 0826   10/20/23 1000  azithromycin  (ZITHROMAX ) 500 mg in sodium chloride  0.9 % 250 mL IVPB  Status:  Discontinued        500 mg 250 mL/hr over 60 Minutes Intravenous Every 24 hours 10/19/23 1529 10/22/23 1133   10/19/23 1400  cefTRIAXone  (ROCEPHIN ) 2 g in sodium chloride  0.9 % 100 mL IVPB        2 g 200 mL/hr over 30 Minutes Intravenous  Once 10/19/23 1356 10/19/23 1647   10/19/23 1400  azithromycin  (ZITHROMAX ) 500 mg in sodium chloride  0.9 % 250 mL IVPB        500 mg 250 mL/hr over 60 Minutes Intravenous  Once 10/19/23 1358 10/19/23 1647         Subjective: Patient sundowned and was agitated last night requiring a few as needed medication.  This morning, he remains calm but confused.  Unable to tell me if his wife was here last night or not.  Objective: Vitals:   10/26/23 2345 10/27/23 0308 10/27/23 0737 10/27/23 1217  BP:  (!) 140/88 (!) 146/86 101/76  Pulse:  100 99 72  Resp:  20 19   Temp:  98.1 F (36.7 C) 99.1 F (37.3 C) 98.2 F (36.8 C)  TempSrc:  Oral  Oral  SpO2: 93% 95% 94% 90%    Intake/Output Summary (Last 24 hours) at 10/27/2023 1447 Last data filed at 10/26/2023 1745 Gross per 24 hour  Intake 240 ml  Output 200 ml  Net 40 ml   There were no vitals filed for this visit.  Examination:  General: Confused elderly man laying in bed. No distress.  CV: RRR. No murmurs, rubs, or gallops. No LE edema Pulmonary: Lungs CTAB. Normal  effort. No wheezing or rales. Abdominal: Soft, nontender, nondistended. Normal bowel sounds. Extremities: Palpable radial and DP pulses. Normal ROM. Skin: Warm and dry. No obvious rash or lesions. Neuro: Alert and oriented to self only. Normal sensation to light touch.  Data Reviewed: I have personally reviewed following labs and imaging studies  CBC: Recent Labs  Lab 10/21/23 0642  WBC 6.0  HGB 11.3*  HCT 34.6*  MCV 88.5  PLT 371   Basic Metabolic Panel: Recent Labs  Lab 10/21/23 0642  NA 139  K 4.0  CL 102  CO2 25  GLUCOSE 98  BUN <5*  CREATININE 1.16  CALCIUM 9.2  MG 1.7   GFR: CrCl cannot be calculated (Unknown ideal weight.). Liver Function Tests: Recent Labs  Lab 10/21/23 0642  AST 21  ALT 13  ALKPHOS 78  BILITOT 0.6  PROT 7.1  ALBUMIN 3.6   No results for input(s): LIPASE, AMYLASE  in the last 168 hours. No results for input(s): AMMONIA in the last 168 hours.  Coagulation Profile: No results for input(s): INR, PROTIME in the last 168 hours.  Cardiac Enzymes: Recent Labs  Lab 10/21/23 0642 10/22/23 1108 10/24/23 0500  CKTOTAL 556* 589* 459*   BNP (last 3 results) No results for input(s): PROBNP in the last 8760 hours. HbA1C: No results for input(s): HGBA1C in the last 72 hours.  CBG: Recent Labs  Lab 10/26/23 1143 10/26/23 1744 10/26/23 2121 10/27/23 0608 10/27/23 1218  GLUCAP 183* 192* 143* 180* 93   Lipid Profile: No results for input(s): CHOL, HDL, LDLCALC, TRIG, CHOLHDL, LDLDIRECT in the last 72 hours. Thyroid  Function Tests: No results for input(s): TSH, T4TOTAL, FREET4, T3FREE, THYROIDAB in the last 72 hours.  Anemia Panel: No results for input(s): VITAMINB12, FOLATE, FERRITIN, TIBC, IRON, RETICCTPCT in the last 72 hours.  Sepsis Labs: No results for input(s): PROCALCITON, LATICACIDVEN in the last 168 hours.   Recent Results (from the past 240 hours)  Blood Culture  (routine x 2)     Status: Abnormal   Collection Time: 10/19/23  8:05 AM   Specimen: BLOOD  Result Value Ref Range Status   Specimen Description   Final    BLOOD LEFT ANTECUBITAL Performed at [facility], [address], [place], [place] [address]    Special Requests   Final    BOTTLES DRAWN AEROBIC AND ANAEROBIC Blood Culture adequate volume Performed at [facility], [address], [place], [place] [address]    Culture  Setup Time   Final    GRAM POSITIVE COCCI IN CLUSTERS AEROBIC BOTTLE ONLY CRITICAL RESULT CALLED TO, READ BACK BY AND VERIFIED WITH: PHARMD M.LAMB AT 1040 ON 10/23/2023 BY T.SAAD.    Culture (A)  Final    MICROCOCCUS SPECIES MICROCOCCUS LUTEUS Standardized susceptibility testing for this organism is not available. Performed at [facility], [address], [place], [place] [address]    Report Status 10/24/2023 FINAL  Final  Blood Culture ID Panel (Reflexed)     Status: None   Collection Time: 10/19/23  8:05 AM  Result Value Ref Range Status   Enterococcus faecalis NOT DETECTED NOT DETECTED Final   Enterococcus Faecium NOT DETECTED NOT DETECTED Final   Listeria monocytogenes NOT DETECTED NOT DETECTED Final   Staphylococcus species NOT DETECTED NOT DETECTED Final   Staphylococcus aureus (BCID) NOT DETECTED NOT DETECTED Final   Staphylococcus epidermidis NOT DETECTED NOT DETECTED Final   Staphylococcus lugdunensis NOT DETECTED NOT DETECTED Final   Streptococcus species NOT DETECTED NOT DETECTED Final   Streptococcus agalactiae NOT DETECTED NOT DETECTED Final   Streptococcus pneumoniae NOT DETECTED NOT DETECTED Final   Streptococcus pyogenes NOT DETECTED NOT DETECTED Final   A.calcoaceticus-baumannii NOT DETECTED NOT DETECTED Final   Bacteroides fragilis NOT DETECTED NOT DETECTED Final   Enterobacterales NOT DETECTED NOT DETECTED Final   Enterobacter cloacae complex NOT DETECTED NOT DETECTED Final   Escherichia coli  NOT DETECTED NOT DETECTED Final   Klebsiella aerogenes NOT DETECTED NOT DETECTED Final   Klebsiella oxytoca NOT DETECTED NOT DETECTED Final   Klebsiella pneumoniae NOT DETECTED NOT DETECTED Final   Proteus species NOT DETECTED NOT DETECTED Final   Salmonella species NOT DETECTED NOT DETECTED Final   Serratia marcescens NOT DETECTED NOT DETECTED Final   Haemophilus influenzae NOT DETECTED NOT DETECTED Final   Neisseria meningitidis NOT DETECTED NOT DETECTED Final   Pseudomonas aeruginosa NOT DETECTED NOT DETECTED Final  Stenotrophomonas maltophilia NOT DETECTED NOT DETECTED Final   Candida albicans NOT DETECTED NOT DETECTED Final   Candida auris NOT DETECTED NOT DETECTED Final   Candida glabrata NOT DETECTED NOT DETECTED Final   Candida krusei NOT DETECTED NOT DETECTED Final   Candida parapsilosis NOT DETECTED NOT DETECTED Final   Candida tropicalis NOT DETECTED NOT DETECTED Final   Cryptococcus neoformans/gattii NOT DETECTED NOT DETECTED Final    Comment: Performed at [facility], [address] [place], [place] [address]  Blood Culture (routine x 2)     Status: None   Collection Time: 10/19/23  8:23 AM   Specimen: BLOOD  Result Value Ref Range Status   Specimen Description   Final    BLOOD RIGHT ANTECUBITAL Performed at [facility], [address] [place], [place] [address]    Special Requests   Final    BOTTLES DRAWN AEROBIC AND ANAEROBIC Blood Culture adequate volume Performed at [facility], [address], [place], [place] [address]    Culture   Final    NO GROWTH 5 DAYS Performed at [facility], [address] [place], [place] [address]    Report Status 10/24/2023 FINAL  Final  Resp panel by RT-PCR (RSV, Flu A&B, Covid) Anterior Nasal Swab     Status: None   Collection Time: 10/20/23  1:50 AM   Specimen: Anterior Nasal Swab  Result Value Ref Range Status   SARS Coronavirus 2 by RT PCR NEGATIVE NEGATIVE Final    Influenza A by PCR NEGATIVE NEGATIVE Final   Influenza B by PCR NEGATIVE NEGATIVE Final    Comment: (NOTE) The Xpert Xpress SARS-CoV-2/FLU/RSV plus assay is intended as an aid in the diagnosis of influenza from Nasopharyngeal swab specimens and should not be used as a sole basis for treatment. Nasal washings and aspirates are unacceptable for Xpert Xpress SARS-CoV-2/FLU/RSV testing.  Fact Sheet for Patients: bloggercourse.com  Fact Sheet for Healthcare Providers: seriousbroker.it  This test is not yet approved or cleared by the [place]  FDA and has been authorized for detection and/or diagnosis of SARS-CoV-2 by FDA under an Emergency Use Authorization (EUA). This EUA will remain in effect (meaning this test can be used) for the duration of the COVID-19 declaration under Section 564(b)(1) of the Act, 21 U.S.C. section 360bbb-3(b)(1), unless the authorization is terminated or revoked.     Resp Syncytial Virus by PCR NEGATIVE NEGATIVE Final    Comment: (NOTE) Fact Sheet for Patients: bloggercourse.com  Fact Sheet for Healthcare Providers: seriousbroker.it  This test is not yet approved or cleared by the [place]  FDA and has been authorized for detection and/or diagnosis of SARS-CoV-2 by FDA under an Emergency Use Authorization (EUA). This EUA will remain in effect (meaning this test can be used) for the duration of the COVID-19 declaration under Section 564(b)(1) of the Act, 21 U.S.C. section 360bbb-3(b)(1), unless the authorization is terminated or revoked.  Performed at [facility], [address], [place], [place] [address]   Respiratory (~20 pathogens) panel by PCR     Status: None   Collection Time: 10/20/23  1:50 AM   Specimen: Nasopharyngeal Swab; Respiratory  Result Value Ref Range Status   Adenovirus NOT DETECTED NOT DETECTED Final   Coronavirus 229E  NOT DETECTED NOT DETECTED Final    Comment: (NOTE) The Coronavirus on the Respiratory Panel, DOES NOT test for the novel  Coronavirus (2019 nCoV)    Coronavirus HKU1 NOT DETECTED NOT  DETECTED Final   Coronavirus NL63 NOT DETECTED NOT DETECTED Final   Coronavirus OC43 NOT DETECTED NOT DETECTED Final   Metapneumovirus NOT DETECTED NOT DETECTED Final   Rhinovirus / Enterovirus NOT DETECTED NOT DETECTED Final   Influenza A NOT DETECTED NOT DETECTED Final   Influenza B NOT DETECTED NOT DETECTED Final   Parainfluenza Virus 1 NOT DETECTED NOT DETECTED Final   Parainfluenza Virus 2 NOT DETECTED NOT DETECTED Final   Parainfluenza Virus 3 NOT DETECTED NOT DETECTED Final   Parainfluenza Virus 4 NOT DETECTED NOT DETECTED Final   Respiratory Syncytial Virus NOT DETECTED NOT DETECTED Final   Bordetella pertussis NOT DETECTED NOT DETECTED Final   Bordetella Parapertussis NOT DETECTED NOT DETECTED Final   Chlamydophila pneumoniae NOT DETECTED NOT DETECTED Final   Mycoplasma pneumoniae NOT DETECTED NOT DETECTED Final    Comment: Performed at [facility], [address] [place], [place] [address]     Radiology Studies: No results found.   Scheduled Meds:  divalproex   125 mg Oral Q12H   escitalopram   20 mg Oral Daily   feeding supplement  237 mL Oral BID BM   gabapentin   300 mg Oral TID   heparin  5,000 Units Subcutaneous Q8H   insulin  aspart  0-5 Units Subcutaneous QHS   insulin  aspart  0-6 Units Subcutaneous TID WC   methadone   20 mg Oral Q12H   pantoprazole   40 mg Oral BID AC   polyethylene glycol  17 g Oral Daily   QUEtiapine   150 mg Oral QHS   senna-docusate  1 tablet Oral QHS   tamsulosin   0.4 mg Oral QHS   Continuous Infusions:     LOS: 7 days   Lucas CHRISTELLA Alderman, MD [facility]  10/27/2023, 2:47 PM   *Please note that this is a verbal dictation therefore any spelling or grammatical errors are due to the Dragon Medical One system interpretation.  Please  page via Amion and do not message via secure chat for urgent patient care matters. Secure chat can be used for non urgent patient care matters.  How to contact the [facility] Attending or Consulting provider 7A - 7P or covering provider during after hours 7P -7A, for this patient?  Check the care team in [facility] and look for a) attending/consulting [facility] provider listed and b) the [facility] team listed. Page or secure chat 7A-7P. Log into [email or web address] and use [facility]'s universal password to access. If you do not have the password, please contact the hospital operator. Locate the [facility] provider you are looking for under [facility] and page to a number that you can be directly reached. If you still have difficulty reaching the provider, please page the [facility] (Director on Call) for the Hospitalists listed on amion for assistance.

## 2023-10-28 DIAGNOSIS — F03911 Unspecified dementia, unspecified severity, with agitation: Secondary | ICD-10-CM | POA: Diagnosis not present

## 2023-10-28 DIAGNOSIS — G9341 Metabolic encephalopathy: Secondary | ICD-10-CM | POA: Diagnosis not present

## 2023-10-28 LAB — GLUCOSE, CAPILLARY
Glucose-Capillary: 139 mg/dL — ABNORMAL HIGH (ref 70–99)
Glucose-Capillary: 197 mg/dL — ABNORMAL HIGH (ref 70–99)
Glucose-Capillary: 296 mg/dL — ABNORMAL HIGH (ref 70–99)
Glucose-Capillary: 132 mg/dL — ABNORMAL HIGH (ref 70–99)

## 2023-10-28 NOTE — Plan of Care (Signed)

## 2023-10-28 NOTE — Progress Notes (Signed)
 PROGRESS NOTE    Lucas White  FMW:[id number] DOB: 07-11-1960 DOA: 10/19/2023 PCP: [facility], [facility]   Brief Narrative:  The patient with PMH of type II DM, bipolar disorder, dementia with behavioral issues with psychosis, HLD, neuropathy, BPH, PTSD presented to [facility] 10/19/2023 with complaints of confusion, progressive generalized weakness, difficulty ambulating, poor p.o. intake, and fall. History was taken from wife and ED provider as the patient was unable to provide any history. [facility] for weekend EEG.   Assessment & Plan:   Principal Problem:   Acute metabolic encephalopathy Active Problems:   Chronic pain syndrome   Type 2 diabetes mellitus with hyperglycemia, with long-term current use of insulin  (HCC)   Obesity (BMI 30.0-34.9)   PTSD (post-traumatic stress disorder)   Dementia with behavioral disturbance (HCC)   Elevated CK   Pedal edema   Fall at home, initial encounter   Altered mental status   Pneumonia of both lungs due to infectious organism  Acute metabolic encephalopathy/Psychosis / PTSD / Aggressive behavior, adult / Dementia with behavioral disturbance: Still ongoing, oriented only to self. EEG shows encephalopathy, no sign of seizures or epileptiform discharges. CT head and MRI brain unremarkable. Metabolic workup thus far negative aside from elevated procalcitonin, MRI brain unremarkable as well. Psych on board, continue home Lexapro , methadone  and continue gabapentin  at reduced dose 300 mg 3 times daily. Patient was initially started on home dose of Zyprexa , that did not help, he was transition to Seroquel  on 10/23/2023, psychiatry managing.They are not concerned about NMS at this time. Patient was found to have high rigidity in all extremities. This raises concern for possible catatonia. Ativan  challenge was performed that was negative and catatonia was ruled out. Presentation thought to be due to his  worsening dementia with delirium. Psych signed off on 10/23, avoid benzodiazepine. Delirium precautions!  No issues overnight, patient still confused but no agitation or as needed Haldol  use for >12hr. Likely discharge home with home health tomorrow if mental status remains stable.   CAP CXR on admission showing mild diffuse interstitial opacities. Completed 5 days of rocephin  and Zithromax . Patient asymptomatic.   Fall at home, initial encounter Ambulate with assistance. Seen by PT/OT, home health recommended.   Elevated CK / Rigidity: Improved. Does not have any significant rigidity on my exam and CK only minimally elevated, does not meet criteria for NMS. CK trended down to 459. Encourage oral hydration.    Pedal edema Acute in nature per wife. Bilateral lower extremity U/S negative for DVT.  Chronic pain syndrome  On methadone  20 mg twice daily. On reduced dose of gabapentin .   Type 2 diabetes mellitus with uncontrolled with hyperglycemia, with long-term current use of insulin  Hemoglobin A1c 7.6%. Home regimen includes metformin  1000, empagliflozin , sitagliptin, Lantus  20 units. Currently only on SSI. Blood sugar controlled.   Obesity (BMI 30.0-34.9) Placing the patient at high risk of poor outcome. ABG does not show any significant hypercarbia. Will need counseling for weight loss and diet modification once he is able to comprehend.   GERD. Continue PPI.   BPH. Continue Flomax .  DVT prophylaxis: heparin injection 5,000 Units Start: 10/19/23 1515 TED hose Start: 10/19/23 1504   Code Status: Full Code  Family Communication:   Status is: Inpatient Remains inpatient appropriate because: Still delirious   Estimated body mass index is 27.67 kg/m as calculated from the following:   Height as of 09/28/21: 5' 11 (1.803 m).   Weight as  of 09/28/21: 90 kg.    Nutritional Assessment: There is no height or weight on file to calculate BMI.. Seen by dietician. I agree with the  assessment and plan as outlined below: Nutrition Status:        Skin Assessment: I have examined the patient's skin and I agree with the wound assessment as performed by the wound care RN as outlined below:    Consultants:  Psych   Procedures:  None none  Antimicrobials:  Anti-infectives (From admission, onward)    Start     Dose/Rate Route Frequency Ordered Stop   10/23/23 1000  azithromycin  (ZITHROMAX ) tablet 500 mg        500 mg Oral  Once 10/22/23 1133 10/23/23 1124   10/20/23 1000  cefTRIAXone  (ROCEPHIN ) 2 g in sodium chloride  0.9 % 100 mL IVPB  Status:  Discontinued        2 g 200 mL/hr over 30 Minutes Intravenous Every 24 hours 10/19/23 1529 10/25/23 0826   10/20/23 1000  azithromycin  (ZITHROMAX ) 500 mg in sodium chloride  0.9 % 250 mL IVPB  Status:  Discontinued        500 mg 250 mL/hr over 60 Minutes Intravenous Every 24 hours 10/19/23 1529 10/22/23 1133   10/19/23 1400  cefTRIAXone  (ROCEPHIN ) 2 g in sodium chloride  0.9 % 100 mL IVPB        2 g 200 mL/hr over 30 Minutes Intravenous  Once 10/19/23 1356 10/19/23 1647   10/19/23 1400  azithromycin  (ZITHROMAX ) 500 mg in sodium chloride  0.9 % 250 mL IVPB        500 mg 250 mL/hr over 60 Minutes Intravenous  Once 10/19/23 1358 10/19/23 1647         Subjective: Patient with no acute issues overnight. Evaluated with spouse at bedside. Patient able to tell me his full name and identify his wife but unable to tell me her name. Discussed with spouse concerning plan for discharge in the next 1-2 days with improvement in his delirium. States she will have home health arranged when patient is ready to come home.  Objective: Vitals:   10/27/23 2357 10/28/23 0459 10/28/23 0800 10/28/23 1222  BP: 107/75 118/83 126/82 (!) 163/87  Pulse: 69 76  (!) 106  Resp: 16 16 16 18   Temp: 98.2 F (36.8 C) 97.8 F (36.6 C) 98.2 F (36.8 C) 99.1 F (37.3 C)  TempSrc: Oral Oral Oral Oral  SpO2: 96% 97% 98%     Intake/Output Summary  (Last 24 hours) at 10/28/2023 1251 Last data filed at 10/28/2023 1223 Gross per 24 hour  Intake 720 ml  Output 500 ml  Net 220 ml   There were no vitals filed for this visit.  Examination:  General: Confused elderly man laying in bed. No distress.  CV: RRR. No murmurs, rubs, or gallops. No LE edema Pulmonary: Lungs CTAB. Normal effort. No wheezing or rales. Abdominal: Soft, nontender, nondistended. Normal bowel sounds. Extremities: Palpable radial and DP pulses. Normal ROM. Skin: Warm and dry. No obvious rash or lesions. Neuro: Alert and oriented to self and also able to identify his wife. Normal sensation to light touch.  Data Reviewed: I have personally reviewed following labs and imaging studies  CBC: No results for input(s): WBC, NEUTROABS, HGB, HCT, MCV, PLT in the last 168 hours.  Basic Metabolic Panel: No results for input(s): NA, K, CL, CO2, GLUCOSE, BUN, CREATININE, CALCIUM, MG, PHOS in the last 168 hours.  GFR: CrCl cannot be calculated (Unknown ideal weight.).  Liver Function Tests: No results for input(s): AST, ALT, ALKPHOS, BILITOT, PROT, ALBUMIN in the last 168 hours.  No results for input(s): LIPASE, AMYLASE in the last 168 hours. No results for input(s): AMMONIA in the last 168 hours.  Coagulation Profile: No results for input(s): INR, PROTIME in the last 168 hours.  Cardiac Enzymes: Recent Labs  Lab 10/22/23 1108 10/24/23 0500  CKTOTAL 589* 459*   BNP (last 3 results) No results for input(s): PROBNP in the last 8760 hours. HbA1C: No results for input(s): HGBA1C in the last 72 hours.  CBG: Recent Labs  Lab 10/27/23 1218 10/27/23 1531 10/27/23 2108 10/28/23 0639 10/28/23 1121  GLUCAP 93 231* 140* 139* 197*   Lipid Profile: No results for input(s): CHOL, HDL, LDLCALC, TRIG, CHOLHDL, LDLDIRECT in the last 72 hours. Thyroid  Function Tests: No results for input(s): TSH,  T4TOTAL, FREET4, T3FREE, THYROIDAB in the last 72 hours.  Anemia Panel: No results for input(s): VITAMINB12, FOLATE, FERRITIN, TIBC, IRON, RETICCTPCT in the last 72 hours.  Sepsis Labs: No results for input(s): PROCALCITON, LATICACIDVEN in the last 168 hours.   Recent Results (from the past 240 hours)  Blood Culture (routine x 2)     Status: Abnormal   Collection Time: 10/19/23  8:05 AM   Specimen: BLOOD  Result Value Ref Range Status   Specimen Description   Final    BLOOD LEFT ANTECUBITAL Performed at [facility], [address] [place], [place] [address]    Special Requests   Final    BOTTLES DRAWN AEROBIC AND ANAEROBIC Blood Culture adequate volume Performed at [facility], [address], [place], [place] [address]    Culture  Setup Time   Final    GRAM POSITIVE COCCI IN CLUSTERS AEROBIC BOTTLE ONLY CRITICAL RESULT CALLED TO, READ BACK BY AND VERIFIED WITH: PHARMD M.LAMB AT 1040 ON 10/23/2023 BY T.SAAD.    Culture (A)  Final    MICROCOCCUS SPECIES MICROCOCCUS LUTEUS Standardized susceptibility testing for this organism is not available. Performed at [facility], [address], [place], [place] [address]    Report Status 10/24/2023 FINAL  Final  Blood Culture ID Panel (Reflexed)     Status: None   Collection Time: 10/19/23  8:05 AM  Result Value Ref Range Status   Enterococcus faecalis NOT DETECTED NOT DETECTED Final   Enterococcus Faecium NOT DETECTED NOT DETECTED Final   Listeria monocytogenes NOT DETECTED NOT DETECTED Final   Staphylococcus species NOT DETECTED NOT DETECTED Final   Staphylococcus aureus (BCID) NOT DETECTED NOT DETECTED Final   Staphylococcus epidermidis NOT DETECTED NOT DETECTED Final   Staphylococcus lugdunensis NOT DETECTED NOT DETECTED Final   Streptococcus species NOT DETECTED NOT DETECTED Final   Streptococcus agalactiae NOT DETECTED NOT DETECTED Final   Streptococcus  pneumoniae NOT DETECTED NOT DETECTED Final   Streptococcus pyogenes NOT DETECTED NOT DETECTED Final   A.calcoaceticus-baumannii NOT DETECTED NOT DETECTED Final   Bacteroides fragilis NOT DETECTED NOT DETECTED Final   Enterobacterales NOT DETECTED NOT DETECTED Final   Enterobacter cloacae complex NOT DETECTED NOT DETECTED Final   Escherichia coli NOT DETECTED NOT DETECTED Final   Klebsiella aerogenes NOT DETECTED NOT DETECTED Final   Klebsiella oxytoca NOT DETECTED NOT DETECTED Final   Klebsiella pneumoniae NOT DETECTED NOT DETECTED Final   Proteus species NOT DETECTED NOT DETECTED Final   Salmonella species NOT DETECTED NOT DETECTED Final   Serratia marcescens NOT DETECTED NOT DETECTED Final   Haemophilus influenzae NOT  DETECTED NOT DETECTED Final   Neisseria meningitidis NOT DETECTED NOT DETECTED Final   Pseudomonas aeruginosa NOT DETECTED NOT DETECTED Final   Stenotrophomonas maltophilia NOT DETECTED NOT DETECTED Final   Candida albicans NOT DETECTED NOT DETECTED Final   Candida auris NOT DETECTED NOT DETECTED Final   Candida glabrata NOT DETECTED NOT DETECTED Final   Candida krusei NOT DETECTED NOT DETECTED Final   Candida parapsilosis NOT DETECTED NOT DETECTED Final   Candida tropicalis NOT DETECTED NOT DETECTED Final   Cryptococcus neoformans/gattii NOT DETECTED NOT DETECTED Final    Comment: Performed at [facility], [address], [place], [place] [address]  Blood Culture (routine x 2)     Status: None   Collection Time: 10/19/23  8:23 AM   Specimen: BLOOD  Result Value Ref Range Status   Specimen Description   Final    BLOOD RIGHT ANTECUBITAL Performed at [facility], [address], [place], [place] [address]    Special Requests   Final    BOTTLES DRAWN AEROBIC AND ANAEROBIC Blood Culture adequate volume Performed at [facility], [address] [place], [place] [address]    Culture   Final    NO GROWTH 5 DAYS Performed  at [facility], [address], [place], [place] [address]    Report Status 10/24/2023 FINAL  Final  Resp panel by RT-PCR (RSV, Flu A&B, Covid) Anterior Nasal Swab     Status: None   Collection Time: 10/20/23  1:50 AM   Specimen: Anterior Nasal Swab  Result Value Ref Range Status   SARS Coronavirus 2 by RT PCR NEGATIVE NEGATIVE Final   Influenza A by PCR NEGATIVE NEGATIVE Final   Influenza B by PCR NEGATIVE NEGATIVE Final    Comment: (NOTE) The Xpert Xpress SARS-CoV-2/FLU/RSV plus assay is intended as an aid in the diagnosis of influenza from Nasopharyngeal swab specimens and should not be used as a sole basis for treatment. Nasal washings and aspirates are unacceptable for Xpert Xpress SARS-CoV-2/FLU/RSV testing.  Fact Sheet for Patients: bloggercourse.com  Fact Sheet for Healthcare Providers: seriousbroker.it  This test is not yet approved or cleared by the [place]  FDA and has been authorized for detection and/or diagnosis of SARS-CoV-2 by FDA under an Emergency Use Authorization (EUA). This EUA will remain in effect (meaning this test can be used) for the duration of the COVID-19 declaration under Section 564(b)(1) of the Act, 21 U.S.C. section 360bbb-3(b)(1), unless the authorization is terminated or revoked.     Resp Syncytial Virus by PCR NEGATIVE NEGATIVE Final    Comment: (NOTE) Fact Sheet for Patients: bloggercourse.com  Fact Sheet for Healthcare Providers: seriousbroker.it  This test is not yet approved or cleared by the [place]  FDA and has been authorized for detection and/or diagnosis of SARS-CoV-2 by FDA under an Emergency Use Authorization (EUA). This EUA will remain in effect (meaning this test can be used) for the duration of the COVID-19 declaration under Section 564(b)(1) of the Act, 21 U.S.C. section 360bbb-3(b)(1), unless the  authorization is terminated or revoked.  Performed at [facility], [address] [place], [place] [address]   Respiratory (~20 pathogens) panel by PCR     Status: None   Collection Time: 10/20/23  1:50 AM   Specimen: Nasopharyngeal Swab; Respiratory  Result Value Ref Range Status   Adenovirus NOT DETECTED NOT DETECTED Final   Coronavirus 229E NOT DETECTED NOT DETECTED Final    Comment: (NOTE)  The Coronavirus on the Respiratory Panel, DOES NOT test for the novel  Coronavirus (2019 nCoV)    Coronavirus HKU1 NOT DETECTED NOT DETECTED Final   Coronavirus NL63 NOT DETECTED NOT DETECTED Final   Coronavirus OC43 NOT DETECTED NOT DETECTED Final   Metapneumovirus NOT DETECTED NOT DETECTED Final   Rhinovirus / Enterovirus NOT DETECTED NOT DETECTED Final   Influenza A NOT DETECTED NOT DETECTED Final   Influenza B NOT DETECTED NOT DETECTED Final   Parainfluenza Virus 1 NOT DETECTED NOT DETECTED Final   Parainfluenza Virus 2 NOT DETECTED NOT DETECTED Final   Parainfluenza Virus 3 NOT DETECTED NOT DETECTED Final   Parainfluenza Virus 4 NOT DETECTED NOT DETECTED Final   Respiratory Syncytial Virus NOT DETECTED NOT DETECTED Final   Bordetella pertussis NOT DETECTED NOT DETECTED Final   Bordetella Parapertussis NOT DETECTED NOT DETECTED Final   Chlamydophila pneumoniae NOT DETECTED NOT DETECTED Final   Mycoplasma pneumoniae NOT DETECTED NOT DETECTED Final    Comment: Performed at [facility], [address], [place], [place] [address]     Radiology Studies: No results found.   Scheduled Meds:  divalproex   125 mg Oral Q12H   escitalopram   20 mg Oral Daily   feeding supplement  237 mL Oral BID BM   gabapentin   300 mg Oral TID   heparin  5,000 Units Subcutaneous Q8H   insulin  aspart  0-5 Units Subcutaneous QHS   insulin  aspart  0-6 Units Subcutaneous TID WC   methadone   20 mg Oral Q12H   pantoprazole   40 mg Oral BID AC   polyethylene glycol  17 g Oral Daily   QUEtiapine    150 mg Oral QHS   senna-docusate  1 tablet Oral QHS   tamsulosin   0.4 mg Oral QHS   Continuous Infusions:     LOS: 8 days   Claretta CHRISTELLA Alderman, MD [facility]  10/28/2023, 12:51 PM   *Please note that this is a verbal dictation therefore any spelling or grammatical errors are due to the Dragon Medical One system interpretation.  Please page via Amion and do not message via secure chat for urgent patient care matters. Secure chat can be used for non urgent patient care matters.  How to contact the [facility] Attending or Consulting provider 7A - 7P or covering provider during after hours 7P -7A, for this patient?  Check the care team in [facility] and look for a) attending/consulting [facility] provider listed and b) the [facility] team listed. Page or secure chat 7A-7P. Log into [email or web address] and use [facility] universal password to access. If you do not have the password, please contact the hospital operator. Locate the [facility] provider you are looking for under [facility] and page to a number that you can be directly reached. If you still have difficulty reaching the provider, please page the [facility] (Director on Call) for the Hospitalists listed on amion for assistance.

## 2023-10-28 NOTE — Plan of Care (Signed)
  Problem: Coping: Goal: Ability to adjust to condition or change in health will improve Outcome: Progressing   Problem: Fluid Volume: Goal: Ability to maintain a balanced intake and output will improve Outcome: Progressing   Problem: Health Behavior/Discharge Planning: Goal: Ability to manage health-related needs will improve Outcome: Progressing   Problem: Metabolic: Goal: Ability to maintain appropriate glucose levels will improve Outcome: Progressing   Problem: Nutritional: Goal: Maintenance of adequate nutrition will improve Outcome: Progressing   Problem: Skin Integrity: Goal: Risk for impaired skin integrity will decrease Outcome: Progressing   Problem: Tissue Perfusion: Goal: Adequacy of tissue perfusion will improve Outcome: Progressing   Problem: Clinical Measurements: Goal: Ability to maintain clinical measurements within normal limits will improve Outcome: Progressing Goal: Will remain free from infection Outcome: Progressing Goal: Respiratory complications will improve Outcome: Progressing Goal: Cardiovascular complication will be avoided Outcome: Progressing   Problem: Activity: Goal: Risk for activity intolerance will decrease Outcome: Progressing   Problem: Nutrition: Goal: Adequate nutrition will be maintained Outcome: Progressing   Problem: Coping: Goal: Level of anxiety will decrease Outcome: Progressing   Problem: Elimination: Goal: Will not experience complications related to bowel motility Outcome: Progressing Goal: Will not experience complications related to urinary retention Outcome: Progressing   Problem: Pain Managment: Goal: General experience of comfort will improve and/or be controlled Outcome: Progressing   Problem: Safety: Goal: Ability to remain free from injury will improve Outcome: Progressing

## 2023-10-29 ENCOUNTER — Other Ambulatory Visit (HOSPITAL_COMMUNITY): Payer: Self-pay

## 2023-10-29 DIAGNOSIS — G9341 Metabolic encephalopathy: Secondary | ICD-10-CM | POA: Diagnosis not present

## 2023-10-29 LAB — GLUCOSE, CAPILLARY
Glucose-Capillary: 152 mg/dL — ABNORMAL HIGH (ref 70–99)
Glucose-Capillary: 249 mg/dL — ABNORMAL HIGH (ref 70–99)
Glucose-Capillary: 228 mg/dL — ABNORMAL HIGH (ref 70–99)

## 2023-10-29 MED ORDER — GABAPENTIN 300 MG PO CAPS
300.0000 mg | ORAL_CAPSULE | Freq: Three times a day (TID) | ORAL | 0 refills | Status: AC
  Filled 2023-10-29: qty 90, 30d supply, fill #0

## 2023-10-29 MED ORDER — QUETIAPINE FUMARATE ER 150 MG PO TB24
150.0000 mg | ORAL_TABLET | Freq: Every day | ORAL | 0 refills | Status: AC
  Filled 2023-10-29: qty 30, 30d supply, fill #0

## 2023-10-29 MED ORDER — DIVALPROEX SODIUM 125 MG PO CSDR
125.0000 mg | DELAYED_RELEASE_CAPSULE | Freq: Two times a day (BID) | ORAL | 0 refills | Status: AC
  Filled 2023-10-29: qty 60, 30d supply, fill #0

## 2023-10-29 NOTE — Progress Notes (Signed)
 PROGRESS NOTE    Lucas White  FMW:[id number] DOB: Sep 26, 1960 DOA: 10/19/2023 PCP: [facility], [facility]   Brief Narrative:  The patient with PMH of type II DM, bipolar disorder, dementia with behavioral issues with psychosis, HLD, neuropathy, BPH, PTSD presented to [facility] 10/19/2023 with complaints of confusion, progressive generalized weakness, difficulty ambulating, poor p.o. intake, and fall. History was taken from wife and ED provider as the patient was unable to provide any history. Transferred to [facility] evening of 10/17 for weekend EEG.   Assessment & Plan:   Principal Problem:   Acute metabolic encephalopathy Active Problems:   Chronic pain syndrome   Type 2 diabetes mellitus with hyperglycemia, with long-term current use of insulin  (HCC)   Obesity (BMI 30.0-34.9)   PTSD (post-traumatic stress disorder)   Dementia with behavioral disturbance (HCC)   Elevated CK   Pedal edema   Fall at home, initial encounter   Altered mental status   Pneumonia of both lungs due to infectious organism   Dementia with agitation (HCC)  Acute metabolic encephalopathy/Psychosis / PTSD / Aggressive behavior, adult / Dementia with behavioral disturbance: Still ongoing, oriented only to self. EEG shows encephalopathy, no sign of seizures or epileptiform discharges. CT head and MRI brain unremarkable. Metabolic workup thus far negative aside from elevated procalcitonin, MRI brain unremarkable as well. Psych on board, continue home Lexapro , methadone  and continue gabapentin  at reduced dose 300 mg 3 times daily. Patient was initially started on home dose of Zyprexa , that did not help, he was transition to Seroquel  on 10/23/2023, psychiatry managing.They are not concerned about NMS at this time. Patient was found to have high rigidity in all extremities. This raises concern for possible catatonia. Ativan  challenge was performed that was negative and catatonia was ruled out.  Presentation thought to be due to his worsening dementia with delirium. Psych signed off on 10/23, avoid benzodiazepine. Delirium precautions.  CAP CXR on admission showing mild diffuse interstitial opacities. Completed 5 days of rocephin  and Zithromax . Patient asymptomatic.   Fall at home, initial encounter Ambulate with assistance. Seen by PT/OT, home health recommended.   Elevated CK / Rigidity: Improved. Does not have any significant rigidity on my exam and CK only minimally elevated, does not meet criteria for NMS. CK trended down to 459. Encourage oral hydration.    Pedal edema Acute in nature per wife. Bilateral lower extremity U/S negative for DVT.  Chronic pain syndrome  On methadone  20 mg twice daily. On reduced dose of gabapentin .   Type 2 diabetes mellitus with uncontrolled with hyperglycemia, with long-term current use of insulin  Hemoglobin A1c 7.6%. Home regimen includes metformin  1000, empagliflozin , sitagliptin, Lantus  20 units. Currently only on SSI. Blood sugar controlled.   Obesity (BMI 30.0-34.9) Placing the patient at high risk of poor outcome. ABG does not show any significant hypercarbia. Will need counseling for weight loss and diet modification once he is able to comprehend.   GERD. Continue PPI.   BPH. Continue Flomax .  DVT prophylaxis: heparin injection 5,000 Units Start: 10/19/23 1515 TED hose Start: 10/19/23 1504   Code Status: Full Code  Family Communication:   Status is: Inpatient Remains inpatient appropriate because: Still delirious   Estimated body mass index is 27.67 kg/m as calculated from the following:   Height as of 09/28/21: 5' 11 (1.803 m).   Weight as of 09/28/21: 90 kg.  Wound 10/28/23 1510 Pressure Injury Perineum Mid Stage 2 -  Partial thickness loss of dermis presenting as a  shallow open injury with a red, pink wound bed without slough. (Active)   Nutritional Assessment: There is no height or weight on file to calculate  BMI.. Seen by dietician. I agree with the assessment and plan as outlined below: Nutrition Status:        Skin Assessment: I have examined the patient's skin and I agree with the wound assessment as performed by the wound care RN as outlined below: Wound 10/28/23 1510 Pressure Injury Perineum Mid Stage 2 -  Partial thickness loss of dermis presenting as a shallow open injury with a red, pink wound bed without slough. (Active)    Consultants:  Psych   Procedures:  None none  Antimicrobials:  Anti-infectives (From admission, onward)    Start     Dose/Rate Route Frequency Ordered Stop   10/23/23 1000  azithromycin  (ZITHROMAX ) tablet 500 mg        500 mg Oral  Once 10/22/23 1133 10/23/23 1124   10/20/23 1000  cefTRIAXone  (ROCEPHIN ) 2 g in sodium chloride  0.9 % 100 mL IVPB  Status:  Discontinued        2 g 200 mL/hr over 30 Minutes Intravenous Every 24 hours 10/19/23 1529 10/25/23 0826   10/20/23 1000  azithromycin  (ZITHROMAX ) 500 mg in sodium chloride  0.9 % 250 mL IVPB  Status:  Discontinued        500 mg 250 mL/hr over 60 Minutes Intravenous Every 24 hours 10/19/23 1529 10/22/23 1133   10/19/23 1400  cefTRIAXone  (ROCEPHIN ) 2 g in sodium chloride  0.9 % 100 mL IVPB        2 g 200 mL/hr over 30 Minutes Intravenous  Once 10/19/23 1356 10/19/23 1647   10/19/23 1400  azithromycin  (ZITHROMAX ) 500 mg in sodium chloride  0.9 % 250 mL IVPB        500 mg 250 mL/hr over 60 Minutes Intravenous  Once 10/19/23 1358 10/19/23 1647         Subjective: Patient seen and examined.  He was appearing more alert than how he was just 4 days ago.  He is oriented to self, cannot tell me his date of birth.  This is how he was 4 days ago.  Not oriented to anything else.  Has hand mittens.  Objective: Vitals:   10/28/23 1959 10/28/23 2346 10/29/23 0400 10/29/23 1155  BP: 106/82 (!) 136/95 107/76 119/78  Pulse: 69 93 74 89  Resp: 18 20 18 18   Temp: 98 F (36.7 C) 98.2 F (36.8 C) (!) 97.2 F (36.2  C) 98.7 F (37.1 C)  TempSrc: Axillary Oral Axillary Oral  SpO2: 95% 96% 100% 99%    Intake/Output Summary (Last 24 hours) at 10/29/2023 1305 Last data filed at 10/29/2023 0500 Gross per 24 hour  Intake 240 ml  Output 600 ml  Net -360 ml   There were no vitals filed for this visit.  Examination:  General exam: Appears confused, continues to talk meaninglessly. Respiratory system: Clear to auscultation. Respiratory effort normal. Cardiovascular system: S1 & S2 heard, RRR. No JVD, murmurs, rubs, gallops or clicks. No pedal edema. Gastrointestinal system: Abdomen is nondistended, soft and nontender. No organomegaly or masses felt. Normal bowel sounds heard. Central nervous system: Alert and oriented x 1. No focal neurological deficits. Extremities: Symmetric 5 x 5 power. Skin: No rashes, lesions or ulcers.  Psychiatry: Judgement and insight appear poor  Data Reviewed: I have personally reviewed following labs and imaging studies  CBC: No results for input(s): WBC, NEUTROABS, HGB, HCT, MCV, PLT  in the last 168 hours.  Basic Metabolic Panel: No results for input(s): NA, K, CL, CO2, GLUCOSE, BUN, CREATININE, CALCIUM, MG, PHOS in the last 168 hours.  GFR: CrCl cannot be calculated (Unknown ideal weight.). Liver Function Tests: No results for input(s): AST, ALT, ALKPHOS, BILITOT, PROT, ALBUMIN in the last 168 hours.  No results for input(s): LIPASE, AMYLASE in the last 168 hours. No results for input(s): AMMONIA in the last 168 hours.  Coagulation Profile: No results for input(s): INR, PROTIME in the last 168 hours.  Cardiac Enzymes: Recent Labs  Lab 10/24/23 0500  CKTOTAL 459*   BNP (last 3 results) No results for input(s): PROBNP in the last 8760 hours. HbA1C: No results for input(s): HGBA1C in the last 72 hours.  CBG: Recent Labs  Lab 10/28/23 1121 10/28/23 1638 10/28/23 2123 10/29/23 0621  10/29/23 1155  GLUCAP 197* 296* 132* 152* 249*   Lipid Profile: No results for input(s): CHOL, HDL, LDLCALC, TRIG, CHOLHDL, LDLDIRECT in the last 72 hours. Thyroid  Function Tests: No results for input(s): TSH, T4TOTAL, FREET4, T3FREE, THYROIDAB in the last 72 hours.  Anemia Panel: No results for input(s): VITAMINB12, FOLATE, FERRITIN, TIBC, IRON, RETICCTPCT in the last 72 hours.  Sepsis Labs: No results for input(s): PROCALCITON, LATICACIDVEN in the last 168 hours.   Recent Results (from the past 240 hours)  Resp panel by RT-PCR (RSV, Flu A&B, Covid) Anterior Nasal Swab     Status: None   Collection Time: 10/20/23  1:50 AM   Specimen: Anterior Nasal Swab  Result Value Ref Range Status   SARS Coronavirus 2 by RT PCR NEGATIVE NEGATIVE Final   Influenza A by PCR NEGATIVE NEGATIVE Final   Influenza B by PCR NEGATIVE NEGATIVE Final    Comment: (NOTE) The Xpert Xpress SARS-CoV-2/FLU/RSV plus assay is intended as an aid in the diagnosis of influenza from Nasopharyngeal swab specimens and should not be used as a sole basis for treatment. Nasal washings and aspirates are unacceptable for Xpert Xpress SARS-CoV-2/FLU/RSV testing.  Fact Sheet for Patients: bloggercourse.com  Fact Sheet for Healthcare Providers: seriousbroker.it  This test is not yet approved or cleared by the [place]  FDA and has been authorized for detection and/or diagnosis of SARS-CoV-2 by FDA under an Emergency Use Authorization (EUA). This EUA will remain in effect (meaning this test can be used) for the duration of the COVID-19 declaration under Section 564(b)(1) of the Act, 21 U.S.C. section 360bbb-3(b)(1), unless the authorization is terminated or revoked.     Resp Syncytial Virus by PCR NEGATIVE NEGATIVE Final    Comment: (NOTE) Fact Sheet for Patients: bloggercourse.com  Fact Sheet for  Healthcare Providers: seriousbroker.it  This test is not yet approved or cleared by the [place]  FDA and has been authorized for detection and/or diagnosis of SARS-CoV-2 by FDA under an Emergency Use Authorization (EUA). This EUA will remain in effect (meaning this test can be used) for the duration of the COVID-19 declaration under Section 564(b)(1) of the Act, 21 U.S.C. section 360bbb-3(b)(1), unless the authorization is terminated or revoked.  Performed at [facility], [address], [place], [place] [address]   Respiratory (~20 pathogens) panel by PCR     Status: None   Collection Time: 10/20/23  1:50 AM   Specimen: Nasopharyngeal Swab; Respiratory  Result Value Ref Range Status   Adenovirus NOT DETECTED NOT DETECTED Final   Coronavirus 229E NOT DETECTED NOT DETECTED Final    Comment: (NOTE) The Coronavirus on the Respiratory Panel,  DOES NOT test for the novel  Coronavirus (2019 nCoV)    Coronavirus HKU1 NOT DETECTED NOT DETECTED Final   Coronavirus NL63 NOT DETECTED NOT DETECTED Final   Coronavirus OC43 NOT DETECTED NOT DETECTED Final   Metapneumovirus NOT DETECTED NOT DETECTED Final   Rhinovirus / Enterovirus NOT DETECTED NOT DETECTED Final   Influenza A NOT DETECTED NOT DETECTED Final   Influenza B NOT DETECTED NOT DETECTED Final   Parainfluenza Virus 1 NOT DETECTED NOT DETECTED Final   Parainfluenza Virus 2 NOT DETECTED NOT DETECTED Final   Parainfluenza Virus 3 NOT DETECTED NOT DETECTED Final   Parainfluenza Virus 4 NOT DETECTED NOT DETECTED Final   Respiratory Syncytial Virus NOT DETECTED NOT DETECTED Final   Bordetella pertussis NOT DETECTED NOT DETECTED Final   Bordetella Parapertussis NOT DETECTED NOT DETECTED Final   Chlamydophila pneumoniae NOT DETECTED NOT DETECTED Final   Mycoplasma pneumoniae NOT DETECTED NOT DETECTED Final    Comment: Performed at [facility], [address] [place], [place] [address]      Radiology Studies: No results found.   Scheduled Meds:  divalproex   125 mg Oral Q12H   escitalopram   20 mg Oral Daily   feeding supplement  237 mL Oral BID BM   gabapentin   300 mg Oral TID   heparin  5,000 Units Subcutaneous Q8H   insulin  aspart  0-5 Units Subcutaneous QHS   insulin  aspart  0-6 Units Subcutaneous TID WC   methadone   20 mg Oral Q12H   pantoprazole   40 mg Oral BID AC   polyethylene glycol  17 g Oral Daily   QUEtiapine   150 mg Oral QHS   senna-docusate  1 tablet Oral QHS   tamsulosin   0.4 mg Oral QHS   Continuous Infusions:     LOS: 9 days   Fredia Skeeter, MD [facility]  10/29/2023, 1:05 PM   *Please note that this is a verbal dictation therefore any spelling or grammatical errors are due to the Dragon Medical One system interpretation.  Please page via Amion and do not message via secure chat for urgent patient care matters. Secure chat can be used for non urgent patient care matters.  How to contact the [facility]H Attending or Consulting provider 7A - 7P or covering provider during after hours 7P -7A, for this patient?  Check the care team in [facility] and look for a) attending/consulting TRH provider listed and b) the TRH team listed. Page or secure chat 7A-7P. Log into [email or web address] and use [place]'s universal password to access. If you do not have the password, please contact the hospital operator. Locate the TR[facility] provider you are looking for under [facility] and page to a number that you can be directly reached. If you still have difficulty reaching the provider, please page the [facility] (Director on Call) for the Hospitalists listed on amion for assistance.

## 2023-10-29 NOTE — Plan of Care (Signed)

## 2023-10-29 NOTE — Progress Notes (Signed)
 Charge nurse assisted with release of restraints to DC home with wife. Dressed. AVS reviewed with wife. Meds picked up from TOC. Seroquel  will be at [facility] for spouse to pick up. Meds given up to 6pm today.  Out with NT in [facility]. And wife.

## 2023-10-29 NOTE — Progress Notes (Signed)
 Occupational Therapy Treatment Patient Details Name: Lucas White MRN: [id number] DOB: August 19, 1960 Today's Date: 10/29/2023   History of present illness Pt is a 63 y.o. male presenting 10/17 with generalized weakness, difficulty ambulating, incontinence, poor oral intake x 1 week. Found to have elevated CK, CXR with mild diffuse interstitial opacities, possibly related to sepsis. MRI brain negative. EEG showed encephalopathy. PMH: DMII, bipolar disorder, dementia with behavioral disturbance/psychosis, , HLD, neuropathy, PTSD.   OT comments  Patient demonstrating gains with bed mobility with patient able to get to EOB with CGA from mod assist. Patient required frequent cues to stay on tasks and sequencing due to perseverating on tasks.  Patient CGA for standing ADL tasks and transfers for safety.  Patient fatigues quickly from standing ADL tasks and requires min assist to return to supine.  Discharge recommendations continue to be appropriate. Acute OT to continue to follow to address established goals.       If plan is discharge home, recommend the following:  A little help with walking and/or transfers;A little help with bathing/dressing/bathroom;Assistance with cooking/housework;Direct supervision/assist for medications management;Direct supervision/assist for financial management;Assist for transportation;Help with stairs or ramp for entrance;Supervision due to cognitive status   Equipment Recommendations  Tub/shower seat    Recommendations for Other Services      Precautions / Restrictions Precautions Precautions: Fall Recall of Precautions/Restrictions: Impaired Restrictions Weight Bearing Restrictions Per Provider Order: No       Mobility Bed Mobility Overal bed mobility: Needs Assistance Bed Mobility: Supine to Sit, Sit to Supine     Supine to sit: Contact guard, Used rails Sit to supine: Min assist   General bed mobility comments: CGA to exit bed on left with cues for  rail use.  Assistance with BLEs to return to supine    Transfers Overall transfer level: Needs assistance Equipment used: 1 person hand held assist Transfers: Sit to/from Stand Sit to Stand: Contact guard assist           General transfer comment: CGA, increased time, and cues to redirect     Balance Overall balance assessment: Needs assistance Sitting-balance support: No upper extremity supported, Feet supported Sitting balance-Leahy Scale: Good Sitting balance - Comments: EOB   Standing balance support: No upper extremity supported Standing balance-Leahy Scale: Fair Standing balance comment: stands at sink for grooming tasks with no UE support, HHA for dynamic standing                           ADL either performed or assessed with clinical judgement   ADL Overall ADL's : Needs assistance/impaired     Grooming: Wash/dry hands;Wash/dry face;Oral care;Supervision/safety;Cueing for sequencing;Standing Grooming Details (indicate cue type and reason): patient perseverates on tasks and requires cues to progress                 Toilet Transfer: Contact guard assist;Ambulation;Regular Toilet;Grab bars Statistician Details (indicate cue type and reason): cues for safety Toileting- Clothing Manipulation and Hygiene: Minimal assistance;Sit to/from stand Toileting - Clothing Manipulation Details (indicate cue type and reason): min assist to complete toilet hygiene while standing     Functional mobility during ADLs: Contact guard assist      Extremity/Trunk Assessment              Vision       Perception     Praxis     Communication Communication Communication: Impaired Factors Affecting Communication: Difficulty expressing self   Cognition  Arousal: Alert Behavior During Therapy: Flat affect Cognition: History of cognitive impairments             OT - Cognition Comments: history of dementia                 Following commands:  Impaired Following commands impaired: Follows one step commands inconsistently, Follows one step commands with increased time      Cueing   Cueing Techniques: Verbal cues, Gestural cues, Tactile cues  Exercises      Shoulder Instructions       General Comments      Pertinent Vitals/ Pain       Pain Assessment Pain Assessment: Faces Faces Pain Scale: No hurt Pain Intervention(s): Monitored during session  Home Living                                          Prior Functioning/Environment              Frequency  Min 2X/week        Progress Toward Goals  OT Goals(current goals can now be found in the care plan section)  Progress towards OT goals: Progressing toward goals  Acute Rehab OT Goals Patient Stated Goal: unable OT Goal Formulation: Patient unable to participate in goal setting Time For Goal Achievement: 11/03/23 Potential to Achieve Goals: Good ADL Goals Pt Will Perform Grooming: with modified independence;standing Pt Will Perform Lower Body Dressing: with modified independence;sit to/from stand Pt Will Transfer to Toilet: with modified independence;ambulating  Plan      Co-evaluation                 AM-PAC OT 6 Clicks Daily Activity     Outcome Measure   Help from another person eating meals?: None Help from another person taking care of personal grooming?: A Little Help from another person toileting, which includes using toliet, bedpan, or urinal?: A Little Help from another person bathing (including washing, rinsing, drying)?: A Little Help from another person to put on and taking off regular upper body clothing?: A Little Help from another person to put on and taking off regular lower body clothing?: None 6 Click Score: 20    End of Session Equipment Utilized During Treatment: Gait belt  OT Visit Diagnosis: Unsteadiness on feet (R26.81);Muscle weakness (generalized) (M62.81);Other symptoms and signs involving  cognitive function   Activity Tolerance Patient tolerated treatment well   Patient Left in bed;with call bell/phone within reach;with bed alarm set;with restraints reapplied   Nurse Communication Mobility status        Time: 1010-1034 OT Time Calculation (min): 24 min  Charges: OT General Charges $OT Visit: 1 Visit OT Treatments $Self Care/Home Management : 23-37 mins  Dick Laine, OTA Acute Rehabilitation Services  Office [phone]   Jeb LITTIE Laine 10/29/2023, 1:34 PM

## 2023-10-29 NOTE — Plan of Care (Signed)
 Assisted with meals, Restraints in use. Meds as ordered. Fluids well tolerated. Toileting as needed.     Problem: Education: Goal: Ability to describe self-care measures that may prevent or decrease complications (Diabetes Survival Skills Education) will improve Outcome: Progressing   Problem: Nutritional: Goal: Maintenance of adequate nutrition will improve Outcome: Progressing   Problem: Skin Integrity: Goal: Risk for impaired skin integrity will decrease Outcome: Progressing   Problem: Skin Integrity: Goal: Risk for impaired skin integrity will decrease Outcome: Progressing   Problem: Safety: Goal: Non-violent Restraint(s) Outcome: Progressing

## 2023-10-29 NOTE — TOC Transition Note (Signed)
 Transition of Care [facility]) - Discharge Note   Patient Details  Name: Lucas White MRN: [id number] Date of Birth: September 06, 1960  [facility] CM/SW Contact:  Andrez JULIANNA George, RN Phone Number: 10/29/2023, 4:06 PM   Clinical Narrative:     Pt is discharging home with home health through [place]. Information on the AVS. Hedda will contact them for the first home visit.  [facility] updated about need for caregivers. Pt's SW through the [employer], [place] aware and said she will work on chemical engineer.  Pts spouse to provide transport home.  Final next level of care: Home w Home Health Services Barriers to Discharge: No Barriers Identified   Patient Goals and CMS Choice   CMS Medicare.gov Compare Post Acute Care list provided to:: Patient Represenative (must comment) Choice offered to / list presented to : Spouse      Discharge Placement                       Discharge Plan and Services Additional resources added to the After Visit Summary for     Discharge Planning Services: CM Consult Post Acute Care Choice: Home Health, Durable Medical Equipment                    HH Arranged: RN, PT, OT, Nurse's Aide [facility] Agency: [facility] Date [facility] Agency Contacted: 10/23/23   Representative spoke with at [facility] Agency: Darleene  Social Drivers of Health (SDOH) Interventions SDOH Screenings   Food Insecurity: No Food Insecurity (10/26/2023)  Housing: Low Risk  (10/26/2023)  Transportation Needs: No Transportation Needs (10/26/2023)  Utilities: Not At Risk (10/26/2023)  Alcohol Screen: Low Risk  (08/04/2021)  Tobacco Use: Low Risk  (10/19/2023)     Readmission Risk Interventions     No data to display

## 2023-10-29 NOTE — Discharge Summary (Signed)
 Physician Discharge Summary  Lucas White FMW:[id number] DOB: 07/22/1960 DOA: 10/19/2023  PCP: [facility]  Admit date: 10/19/2023 Discharge date: 10/29/2023 30 Day Unplanned Readmission Risk Score    Flowsheet Row ED to Hosp-Admission (Current) from 10/19/2023 in [facility]  30 Day Unplanned Readmission Risk Score (%) 26.2 Filed at 10/29/2023 1200    This score is the patient's risk of an unplanned readmission within 30 days of being discharged (0 -100%). The score is based on dignosis, age, lab data, medications, orders, and past utilization.   Low:  0-14.9   Medium: 15-21.9   High: 22-29.9   Extreme: 30 and above          Admitted From: Home Disposition: Home  Recommendations for Outpatient Follow-up:  Follow up with PCP in 1-2 weeks Please obtain BMP/CBC in one week Please follow up with your PCP on the following pending results: Unresulted Labs (From admission, onward)     Start     Ordered   10/30/23 0500  CK  Tomorrow morning,   R       Question:  Specimen collection method  Answer:  Lab=Lab collect   10/29/23 1306   10/30/23 0500  CBC with Differential/Platelet  Tomorrow morning,   R       Question:  Specimen collection method  Answer:  Lab=Lab collect   10/29/23 1306              Home Health: Yes Equipment/Devices: None none  Discharge Condition: Stable CODE STATUS: Full code Diet recommendation:  Diet Order             DIET DYS 3 Room service appropriate? Yes; Fluid consistency: Thin  Diet effective now                   Subjective: Patient seen and examined earlier this morning, patient was alert and oriented to his name and date of birth and nothing else.  He was more awake than before.  He was still in had mittens.  Patient was unable to have any conversation.  He continued to talk meaninglessly.  Brief/Interim Summary: The patient with PMH of type II DM, bipolar disorder, dementia with behavioral issues  with psychosis, HLD, neuropathy, BPH, PTSD presented to [facility] 10/19/2023 with complaints of confusion, progressive generalized weakness, difficulty ambulating, poor p.o. intake, and fall. History was taken from wife and ED provider as the patient was unable to provide any history. Transferred to [facility] evening of 10/17 for weekend EEG.    Acute metabolic encephalopathy/Psychosis / PTSD / Aggressive behavior, adult / Dementia with behavioral disturbance: EEG shows encephalopathy, no sign of seizures or epileptiform discharges. CT head and MRI brain unremarkable. Metabolic workup thus far negative aside from elevated procalcitonin, MRI brain unremarkable as well. Psych seen by psych, Lexapro  and methadone  continued, they also continued gabapentin  but at reduced dose 300 mg 3 times daily. Patient was initially started on home dose of Zyprexa , that did not help, he was transitioned to Seroquel  on 10/23/2023.  There were no concerns of NMS by psychiatry. Patient was found to have high rigidity in all extremities.  This raised concern for possible catatonia. Ativan  challenge was performed that was negative and catatonia was ruled out. Presentation thought to be due to his worsening dementia with delirium.  They increased patient's nightly Seroquel , discontinued Zyprexa .  Psych signed off on 10/23.  Patient still is confused but more awake than before.  Initially  he was very lethargic.  I had a lengthy discussion with patient's spouse Lucas White who has seen him over the weekend and she says  that is my AJ, and he has he is back to baseline and she wanted to take him home.  She says that he typically is like that, she believes that he can understand everything but cannot word thoughts out.   CAP CXR on admission showing mild diffuse interstitial opacities. Completed 5 days of rocephin  and Zithromax . Patient asymptomatic.   Fall at home, initial encounter Ambulate with assistance. Seen by PT/OT,  home health recommended.   Elevated CK / Rigidity: Improved. Does not have any significant rigidity on my exam and CK only minimally elevated, does not meet criteria for NMS. CK trended down to 459. Encouraged oral hydration.    Pedal edema Acute in nature per wife. Bilateral lower extremity U/S negative for DVT.   Chronic pain syndrome  On methadone  20 mg twice daily. On reduced dose of gabapentin .   Type 2 diabetes mellitus with uncontrolled with hyperglycemia, with long-term current use of insulin  Hemoglobin A1c 7.6%. Home regimen includes metformin  1000, empagliflozin , sitagliptin, Lantus  20 units.  Resume home medications with the hope that his appetite will be better.   Obesity (BMI 30.0-34.9) Placing the patient at high risk of poor outcome.   GERD. Continue PPI.   BPH. Continue Flomax .  Discharge plan was discussed with patient and/or family member and they verbalized understanding and agreed with it.  Discharge Diagnoses:  Principal Problem:   Acute metabolic encephalopathy Active Problems:   Chronic pain syndrome   Type 2 diabetes mellitus with hyperglycemia, with long-term current use of insulin  (HCC)   Obesity (BMI 30.0-34.9)   PTSD (post-traumatic stress disorder)   Dementia with behavioral disturbance (HCC)   Elevated CK   Pedal edema   Fall at home, initial encounter   Altered mental status   Pneumonia of both lungs due to infectious organism   Dementia with agitation [facility])    Discharge Instructions   Allergies as of 10/29/2023   No Known Allergies      Medication List     STOP taking these medications    OLANZapine  7.5 MG tablet Commonly known as: ZYPREXA    QUEtiapine  25 MG tablet Commonly known as: SEROQUEL  Replaced by: QUEtiapine  Fumarate 150 MG 24 hr tablet       TAKE these medications    cyclobenzaprine  10 MG tablet Commonly known as: FLEXERIL  Take 5 mg by mouth at bedtime as needed (for pain).   divalproex  125 MG  capsule Commonly known as: DEPAKOTE  SPRINKLE Take 1 capsule (125 mg total) by mouth every 12 (twelve) hours. What changed: when to take this   Empagliflozin -Linaglip-Metform 25-05-998 MG Tb24 Take 1 tablet by mouth every evening.   escitalopram  20 MG tablet Commonly known as: LEXAPRO  Take 20 mg by mouth daily.   gabapentin  300 MG capsule Commonly known as: NEURONTIN  Take 1 capsule (300 mg total) by mouth 3 (three) times daily. What changed:  how much to take when to take this   glipiZIDE  5 MG tablet Commonly known as: GLUCOTROL  Take 5 mg by mouth 2 (two) times daily before a meal.   insulin  glargine 100 UNIT/ML Solostar Pen Commonly known as: LANTUS  Inject 20 Units into the skin daily in the afternoon.   lidocaine 5 % Commonly known as: LIDODERM Place 1 patch onto the skin daily as needed (for pain- Remove & Discard patch within 12 hours or as directed  by MD).   linagliptin  5 MG Tabs tablet Commonly known as: TRADJENTA  Take 1 tablet (5 mg total) by mouth daily with breakfast.   loperamide  2 MG capsule Commonly known as: IMODIUM  Take 2 mg by mouth daily as needed for diarrhea or loose stools.   melatonin 3 MG Tabs tablet Take 6 mg by mouth at bedtime.   metformin  1000 MG (OSM) 24 hr tablet Commonly known as: FORTAMET  Take 1 tablet (1,000 mg total) by mouth daily with breakfast.   methadone  10 MG tablet Commonly known as: DOLOPHINE  Take 10 mg by mouth 4 (four) times daily.   pantoprazole  40 MG tablet Commonly known as: PROTONIX  Take 40 mg by mouth 2 (two) times daily before a meal.   pravastatin  40 MG tablet Commonly known as: PRAVACHOL  Take 40 mg by mouth at bedtime.   QUEtiapine  Fumarate 150 MG 24 hr tablet Commonly known as: SEROQUEL  XR Take 1 tablet (150 mg total) by mouth at bedtime. Replaces: QUEtiapine  25 MG tablet   sitaGLIPtin 50 MG tablet Commonly known as: JANUVIA Take 50 mg by mouth daily.   tamsulosin  0.4 MG Caps capsule Commonly known  as: FLOMAX  Take 0.4 mg by mouth at bedtime.   Vitamin D3 25 MCG (1000 UT) Caps Take 1,000 Units by mouth daily.   Voltaren  1 % Gel Generic drug: diclofenac  Sodium Apply 4 g topically 3 (three) times daily as needed (for pain- affected areas).        Contact information for follow-up providers     [facility] Follow up.   Specialty: Home Health Services Why: Hedda will contact you for the first home visit Contact information: [address] [place] [address] [phone]         [facility] Follow up in 1 week(s).   Contact information: [address] [facility] [place] [address] [phone]              Contact information for after-discharge care     Home Medical Care     [facility]) .   Service: Home Health Services Contact information: [address] [place]  [address] [phone]                    No Known Allergies  Consultations: Psychiatry   Procedures/Studies: VAS US  LOWER EXTREMITY VENOUS (DVT) Result Date: 10/23/2023  Lower Venous DVT Study Patient Name:  TAYSOM GLYMPH  Date of Exam:   10/22/2023 Medical Rec #: [id number]       Accession #:    [id number] Date of Birth: May 15, 1960       Patient Gender: M Patient Age:   32 years Exam Location:  [facility] Procedure:      VAS US  LOWER EXTREMITY VENOUS (DVT) Referring Phys: CATHERINE LYNN --------------------------------------------------------------------------------  Indications: Swelling, Edema, and confusion, fall. Other Indications: Weakness, difficulty ambulating. Limitations: AMS patient, could not properly position and trying to get out of bed during exam study. Comparison Study: No prior exam. Performing Technologist: Edilia Elden Appl  Examination Guidelines: A complete evaluation includes B-mode imaging, spectral Doppler, color Doppler, and power Doppler as needed of  all accessible portions of each vessel. Bilateral testing is considered an integral part of a complete examination. Limited examinations for reoccurring indications may be performed as noted. The reflux portion of the exam is performed with the patient in reverse Trendelenburg.  +---------+---------------+---------+-----------+----------+--------------+ RIGHT    CompressibilityPhasicitySpontaneityPropertiesThrombus Aging +---------+---------------+---------+-----------+----------+--------------+ CFV  Full           Yes      Yes                                 +---------+---------------+---------+-----------+----------+--------------+ SFJ      Full           Yes      Yes                                 +---------+---------------+---------+-----------+----------+--------------+ FV Prox  Full                                                        +---------+---------------+---------+-----------+----------+--------------+ FV Mid   Full                                                        +---------+---------------+---------+-----------+----------+--------------+ FV DistalFull                                                        +---------+---------------+---------+-----------+----------+--------------+ PFV      Full                                                        +---------+---------------+---------+-----------+----------+--------------+ POP      Full           Yes      Yes                                 +---------+---------------+---------+-----------+----------+--------------+ PTV      Full                                                        +---------+---------------+---------+-----------+----------+--------------+ PERO     Full                                                        +---------+---------------+---------+-----------+----------+--------------+    +---------+---------------+---------+-----------+----------+--------------+ LEFT     CompressibilityPhasicitySpontaneityPropertiesThrombus Aging +---------+---------------+---------+-----------+----------+--------------+ CFV      Full           Yes      Yes                                 +---------+---------------+---------+-----------+----------+--------------+  SFJ      Full           Yes      Yes                                 +---------+---------------+---------+-----------+----------+--------------+ FV Prox  Full                                                        +---------+---------------+---------+-----------+----------+--------------+ FV Mid   Full                                                        +---------+---------------+---------+-----------+----------+--------------+ FV DistalFull                                                        +---------+---------------+---------+-----------+----------+--------------+ PFV      Full                                                        +---------+---------------+---------+-----------+----------+--------------+ POP      Full           Yes      Yes                                 +---------+---------------+---------+-----------+----------+--------------+ PTV      Full                                                        +---------+---------------+---------+-----------+----------+--------------+ PERO     Full                                                        +---------+---------------+---------+-----------+----------+--------------+     Summary: BILATERAL: - No evidence of deep vein thrombosis seen in the lower extremities, bilaterally. -No evidence of popliteal cyst, bilaterally.   *See table(s) above for measurements and observations. Electronically signed by Lonni Gaskins MD on 10/23/2023 at 10:21:55 AM.    Final    EEG adult Result Date: 10/20/2023 Shelton Arlin KIDD,  MD     10/20/2023  7:03 AM Patient Name: Lucas White MRN: [id number] Epilepsy Attending: Arlin KIDD Shelton Referring Physician/Provider: Tobie Yetta HERO, MD Date: 10/19/2023 Duration: 22.28 mins Patient history: 63yo M with ams. EEG to evaluate for seizure Level of alertness: Awake AEDs during EEG study: VPA, GBP Technical aspects: This EEG study  was done with scalp electrodes positioned according to the 10-20 International system of electrode placement. Electrical activity was reviewed with band pass filter of 1-70Hz , sensitivity of 7 uV/mm, display speed of 86mm/sec with a 60Hz  notched filter applied as appropriate. EEG data were recorded continuously and digitally stored.  Video monitoring was available and reviewed as appropriate. Description: The posterior dominant rhythm consists of 7 Hz activity of moderate voltage (25-35 uV) seen predominantly in posterior head regions, symmetric and reactive to eye opening and eye closing. EEG showed continuous generalized 5 to 7 Hz theta slowing admixed with intermittent 2-3hz  delta slowing. Hyperventilation and photic stimulation were not performed.   ABNORMALITY - Continuous slow, generalized IMPRESSION: This study is suggestive of generalized cerebral dysfunction (encephalopathy). No seizures or epileptiform discharges were seen throughout the recording. Arlin MALVA Krebs   MR BRAIN WO CONTRAST Result Date: 10/19/2023 EXAM: MRI BRAIN WITHOUT CONTRAST 10/19/2023 07:48:39 PM TECHNIQUE: Multiplanar multisequence MRI of the head/brain was performed without the administration of intravenous contrast. COMPARISON: CT from earlier the same day. CLINICAL HISTORY: Mental status change, unknown cause. FINDINGS: BRAIN AND VENTRICLES: No acute infarct. No intracranial hemorrhage. No mass. No midline shift. No hydrocephalus. The sella is unremarkable. Normal flow voids. ORBITS: No acute abnormality. SINUSES AND MASTOIDS: Mild scattered mucosal thickening about the ethmoid air  cells. BONES AND SOFT TISSUES: Normal marrow signal. No acute soft tissue abnormality. IMPRESSION: 1. Normal brain MRI. No acute intracranial abnormality. Electronically signed by: Morene Hoard MD 10/19/2023 08:07 PM EDT RP Workstation: [id number]   CT Head Wo Contrast Result Date: 10/19/2023 EXAM: CT HEAD WITHOUT CONTRAST 10/19/2023 09:40:48 AM TECHNIQUE: CT of the head was performed without the administration of intravenous contrast. Automated exposure control, iterative reconstruction, and/or weight based adjustment of the mA/kV was utilized to reduce the radiation dose to as low as reasonably achievable. COMPARISON: Head CT 04/29/2023. CLINICAL HISTORY: 63 year old male with mental status change, 2 weeks of worsening weakness, and history of dementia. Wife reports increased confusion. FINDINGS: BRAIN AND VENTRICLES: No acute hemorrhage. No evidence of acute infarct. No hydrocephalus. No extra-axial collection. No mass effect or midline shift. Brain volume is stable and normal for age. Calcified atherosclerosis at the skull base. No suspicious intracranial vascular hyperdensity. ORBITS: No acute abnormality. Chronic bilateral lamina papyracea fractures are stable. SINUSES: Mild chronic paranasal sinus mucosal thickening is stable. Small right fontal sinus osteoma (benign). SOFT TISSUES AND SKULL: Small chronic right anterior vertex scalp lipoma is benign series 4 image 37. No acute soft tissue abnormality. No skull fracture. MIDDLE EARS AND MASTOIDS: Middle ears and mastoids well aerated. IMPRESSION: 1. No acute intracranial abnormality. Stable and normal for age noncontrast CT appearance of the brain. Electronically signed by: Helayne Hurst MD 10/19/2023 09:47 AM EDT RP Workstation: [id number]   DG Chest Port 1 View Result Date: 10/19/2023 EXAM: 1 VIEW(S) XRAY OF THE CHEST 10/19/2023 08:51:00 AM COMPARISON: AV radiograph of the chest dated 10/27/2021. CLINICAL HISTORY: Questionable sepsis - evaluate  for abnormality. Possible sepsis. FINDINGS: LUNGS AND PLEURA: Low lung volumes. Mild diffuse interstitial opacities. No focal pulmonary opacity. No pulmonary edema. No pleural effusion. No pneumothorax. HEART AND MEDIASTINUM: Enlarged cardiomediastinal silhouette, likely due to technique and rotation. BONES AND SOFT TISSUES: No acute osseous abnormality. IMPRESSION: 1. Mild diffuse interstitial opacities, possibly related to sepsis. 2. Enlarged cardiomediastinal silhouette, likely due to technique and rotation. Electronically signed by: Evalene Coho MD 10/19/2023 09:06 AM EDT RP Workstation: [id number]     Discharge Exam: Vitals:   10/29/23  0400 10/29/23 1155  BP: 107/76 119/78  Pulse: 74 89  Resp: 18 18  Temp: (!) 97.2 F (36.2 C) 98.7 F (37.1 C)  SpO2: 100% 99%   Vitals:   10/28/23 1959 10/28/23 2346 10/29/23 0400 10/29/23 1155  BP: 106/82 (!) 136/95 107/76 119/78  Pulse: 69 93 74 89  Resp: 18 20 18 18   Temp: 98 F (36.7 C) 98.2 F (36.8 C) (!) 97.2 F (36.2 C) 98.7 F (37.1 C)  TempSrc: Axillary Oral Axillary Oral  SpO2: 95% 96% 100% 99%    General: Pt is alert, awake, not in acute distress Cardiovascular: RRR, S1/S2 +, no rubs, no gallops Respiratory: CTA bilaterally, no wheezing, no rhonchi Abdominal: Soft, NT, ND, bowel sounds + Extremities: no edema, no cyanosis    The results of significant diagnostics from this hospitalization (including imaging, microbiology, ancillary and laboratory) are listed below for reference.     Microbiology: Recent Results (from the past 240 hours)  Resp panel by RT-PCR (RSV, Flu A&B, Covid) Anterior Nasal Swab     Status: None   Collection Time: 10/20/23  1:50 AM   Specimen: Anterior Nasal Swab  Result Value Ref Range Status   SARS Coronavirus 2 by RT PCR NEGATIVE NEGATIVE Final   Influenza A by PCR NEGATIVE NEGATIVE Final   Influenza B by PCR NEGATIVE NEGATIVE Final    Comment: (NOTE) The Xpert Xpress SARS-CoV-2/FLU/RSV plus  assay is intended as an aid in the diagnosis of influenza from Nasopharyngeal swab specimens and should not be used as a sole basis for treatment. Nasal washings and aspirates are unacceptable for Xpert Xpress SARS-CoV-2/FLU/RSV testing.  Fact Sheet for Patients: bloggercourse.com  Fact Sheet for Healthcare Providers: seriousbroker.it  This test is not yet approved or cleared by the [place]  FDA and has been authorized for detection and/or diagnosis of SARS-CoV-2 by FDA under an Emergency Use Authorization (EUA). This EUA will remain in effect (meaning this test can be used) for the duration of the COVID-19 declaration under Section 564(b)(1) of the Act, 21 U.S.C. section 360bbb-3(b)(1), unless the authorization is terminated or revoked.     Resp Syncytial Virus by PCR NEGATIVE NEGATIVE Final    Comment: (NOTE) Fact Sheet for Patients: bloggercourse.com  Fact Sheet for Healthcare Providers: seriousbroker.it  This test is not yet approved or cleared by the [place]  FDA and has been authorized for detection and/or diagnosis of SARS-CoV-2 by FDA under an Emergency Use Authorization (EUA). This EUA will remain in effect (meaning this test can be used) for the duration of the COVID-19 declaration under Section 564(b)(1) of the Act, 21 U.S.C. section 360bbb-3(b)(1), unless the authorization is terminated or revoked.  Performed at [facility], [address] [place], [place] [address]   Respiratory (~20 pathogens) panel by PCR     Status: None   Collection Time: 10/20/23  1:50 AM   Specimen: Nasopharyngeal Swab; Respiratory  Result Value Ref Range Status   Adenovirus NOT DETECTED NOT DETECTED Final   Coronavirus 229E NOT DETECTED NOT DETECTED Final    Comment: (NOTE) The Coronavirus on the Respiratory Panel, DOES NOT test for the novel  Coronavirus (2019 nCoV)     Coronavirus HKU1 NOT DETECTED NOT DETECTED Final   Coronavirus NL63 NOT DETECTED NOT DETECTED Final   Coronavirus OC43 NOT DETECTED NOT DETECTED Final   Metapneumovirus NOT DETECTED NOT DETECTED Final   Rhinovirus / Enterovirus NOT DETECTED NOT DETECTED Final   Influenza A NOT DETECTED NOT DETECTED  Final   Influenza B NOT DETECTED NOT DETECTED Final   Parainfluenza Virus 1 NOT DETECTED NOT DETECTED Final   Parainfluenza Virus 2 NOT DETECTED NOT DETECTED Final   Parainfluenza Virus 3 NOT DETECTED NOT DETECTED Final   Parainfluenza Virus 4 NOT DETECTED NOT DETECTED Final   Respiratory Syncytial Virus NOT DETECTED NOT DETECTED Final   Bordetella pertussis NOT DETECTED NOT DETECTED Final   Bordetella Parapertussis NOT DETECTED NOT DETECTED Final   Chlamydophila pneumoniae NOT DETECTED NOT DETECTED Final   Mycoplasma pneumoniae NOT DETECTED NOT DETECTED Final    Comment: Performed at [facility], [address], [place], [place] [address]     Labs: BNP (last 3 results) Recent Labs    10/20/23 0519  BNP 17.9   Basic Metabolic Panel: No results for input(s): NA, K, CL, CO2, GLUCOSE, BUN, CREATININE, CALCIUM, MG, PHOS in the last 168 hours. Liver Function Tests: No results for input(s): AST, ALT, ALKPHOS, BILITOT, PROT, ALBUMIN in the last 168 hours. No results for input(s): LIPASE, AMYLASE in the last 168 hours. No results for input(s): AMMONIA in the last 168 hours. CBC: No results for input(s): WBC, NEUTROABS, HGB, HCT, MCV, PLT in the last 168 hours. Cardiac Enzymes: Recent Labs  Lab 10/24/23 0500  CKTOTAL 459*   BNP: Invalid input(s): POCBNP CBG: Recent Labs  Lab 10/28/23 1121 10/28/23 1638 10/28/23 2123 10/29/23 0621 10/29/23 1155  GLUCAP 197* 296* 132* 152* 249*   D-Dimer No results for input(s): DDIMER in the last 72 hours. Hgb A1c No results for input(s): HGBA1C in the last 72 hours. Lipid  Profile No results for input(s): CHOL, HDL, LDLCALC, TRIG, CHOLHDL, LDLDIRECT in the last 72 hours. Thyroid  function studies No results for input(s): TSH, T4TOTAL, T3FREE, THYROIDAB in the last 72 hours.  Invalid input(s): FREET3 Anemia work up No results for input(s): VITAMINB12, FOLATE, FERRITIN, TIBC, IRON, RETICCTPCT in the last 72 hours. Urinalysis    Component Value Date/Time   COLORURINE YELLOW 10/19/2023 0920   APPEARANCEUR CLEAR 10/19/2023 0920   LABSPEC 1.017 10/19/2023 0920   PHURINE 5.0 10/19/2023 0920   GLUCOSEU >=500 (A) 10/19/2023 0920   HGBUR NEGATIVE 10/19/2023 0920   BILIRUBINUR NEGATIVE 10/19/2023 0920   KETONESUR NEGATIVE 10/19/2023 0920   PROTEINUR NEGATIVE 10/19/2023 0920   NITRITE NEGATIVE 10/19/2023 0920   LEUKOCYTESUR NEGATIVE 10/19/2023 0920   Sepsis Labs No results for input(s): WBC in the last 168 hours.  Invalid input(s): PROCALCITONIN, LACTICIDVEN Microbiology Recent Results (from the past 240 hours)  Resp panel by RT-PCR (RSV, Flu A&B, Covid) Anterior Nasal Swab     Status: None   Collection Time: 10/20/23  1:50 AM   Specimen: Anterior Nasal Swab  Result Value Ref Range Status   SARS Coronavirus 2 by RT PCR NEGATIVE NEGATIVE Final   Influenza A by PCR NEGATIVE NEGATIVE Final   Influenza B by PCR NEGATIVE NEGATIVE Final    Comment: (NOTE) The Xpert Xpress SARS-CoV-2/FLU/RSV plus assay is intended as an aid in the diagnosis of influenza from Nasopharyngeal swab specimens and should not be used as a sole basis for treatment. Nasal washings and aspirates are unacceptable for Xpert Xpress SARS-CoV-2/FLU/RSV testing.  Fact Sheet for Patients: bloggercourse.com  Fact Sheet for Healthcare Providers: seriousbroker.it  This test is not yet approved or cleared by the [place]  FDA and has been authorized for detection and/or diagnosis of SARS-CoV-2 by FDA  under an Emergency Use Authorization (EUA). This EUA will remain in effect (meaning this  test can be used) for the duration of the COVID-19 declaration under Section 564(b)(1) of the Act, 21 U.S.C. section 360bbb-3(b)(1), unless the authorization is terminated or revoked.     Resp Syncytial Virus by PCR NEGATIVE NEGATIVE Final    Comment: (NOTE) Fact Sheet for Patients: bloggercourse.com  Fact Sheet for Healthcare Providers: seriousbroker.it  This test is not yet approved or cleared by the [place]  FDA and has been authorized for detection and/or diagnosis of SARS-CoV-2 by FDA under an Emergency Use Authorization (EUA). This EUA will remain in effect (meaning this test can be used) for the duration of the COVID-19 declaration under Section 564(b)(1) of the Act, 21 U.S.C. section 360bbb-3(b)(1), unless the authorization is terminated or revoked.  Performed at [facility], [address], [place], [place] [address]   Respiratory (~20 pathogens) panel by PCR     Status: None   Collection Time: 10/20/23  1:50 AM   Specimen: Nasopharyngeal Swab; Respiratory  Result Value Ref Range Status   Adenovirus NOT DETECTED NOT DETECTED Final   Coronavirus 229E NOT DETECTED NOT DETECTED Final    Comment: (NOTE) The Coronavirus on the Respiratory Panel, DOES NOT test for the novel  Coronavirus (2019 nCoV)    Coronavirus HKU1 NOT DETECTED NOT DETECTED Final   Coronavirus NL63 NOT DETECTED NOT DETECTED Final   Coronavirus OC43 NOT DETECTED NOT DETECTED Final   Metapneumovirus NOT DETECTED NOT DETECTED Final   Rhinovirus / Enterovirus NOT DETECTED NOT DETECTED Final   Influenza A NOT DETECTED NOT DETECTED Final   Influenza B NOT DETECTED NOT DETECTED Final   Parainfluenza Virus 1 NOT DETECTED NOT DETECTED Final   Parainfluenza Virus 2 NOT DETECTED NOT DETECTED Final   Parainfluenza Virus 3 NOT DETECTED NOT DETECTED Final    Parainfluenza Virus 4 NOT DETECTED NOT DETECTED Final   Respiratory Syncytial Virus NOT DETECTED NOT DETECTED Final   Bordetella pertussis NOT DETECTED NOT DETECTED Final   Bordetella Parapertussis NOT DETECTED NOT DETECTED Final   Chlamydophila pneumoniae NOT DETECTED NOT DETECTED Final   Mycoplasma pneumoniae NOT DETECTED NOT DETECTED Final    Comment: Performed at [facility], [address], [place], [place] [address]    FURTHER DISCHARGE INSTRUCTIONS:   Get Medicines reviewed and adjusted: Please take all your medications with you for your next visit with your Primary MD   Laboratory/radiological data: Please request your Primary MD to go over all hospital tests and procedure/radiological results at the follow up, please ask your Primary MD to get all Hospital records sent to his/her office.   In some cases, they will be blood work, cultures and biopsy results pending at the time of your discharge. Please request that your primary care M.D. goes through all the records of your hospital data and follows up on these results.   Also Note the following: If you experience worsening of your admission symptoms, develop shortness of breath, life threatening emergency, suicidal or homicidal thoughts you must seek medical attention immediately by calling 911 or calling your MD immediately  if symptoms less severe.   You must read complete instructions/literature along with all the possible adverse reactions/side effects for all the Medicines you take and that have been prescribed to you. Take any new Medicines after you have completely understood and accpet all the possible adverse reactions/side effects.    patient was instructed, not to drive, operate heavy machinery, perform activities at heights, swimming or participation in water  activities or provide baby-sitting  services while on Pain, Sleep and Anxiety Medications; until their outpatient Physician has advised to do so again. Also  recommended to not to take more than prescribed Pain, Sleep and Anxiety Medications.  It is not advisable to combine anxiety, sleep and pain medications without talking with your primary care provider.     Wear Seat belts while driving.   Please note: You were cared for by a hospitalist during your hospital stay. Once you are discharged, your primary care physician will handle any further medical issues. Please note that NO REFILLS for any discharge medications will be authorized once you are discharged, as it is imperative that you return to your primary care physician (or establish a relationship with a primary care physician if you do not have one) for your post hospital discharge needs so that they can reassess your need for medications and monitor your lab values  Time coordinating discharge: Over 30 minutes  SIGNED:   Fredia Skeeter, MD  [facility] 10/29/2023, 3:35 PM *Please note that this is a verbal dictation therefore any spelling or grammatical errors are due to the Dragon Medical One system interpretation. If 7PM-7AM, please contact night-coverage [email or web address]
